# Patient Record
Sex: Female | Born: 1945 | Race: White | Hispanic: No | Smoking: Never smoker
Health system: Southern US, Community
[De-identification: ages and names within clinical notes are randomized; demographics above are authoritative.]

## PROBLEM LIST (undated history)

## (undated) DIAGNOSIS — K219 Gastro-esophageal reflux disease without esophagitis: Secondary | ICD-10-CM

## (undated) DIAGNOSIS — I10 Essential (primary) hypertension: Secondary | ICD-10-CM

## (undated) DIAGNOSIS — Z973 Presence of spectacles and contact lenses: Secondary | ICD-10-CM

## (undated) DIAGNOSIS — E7800 Pure hypercholesterolemia, unspecified: Secondary | ICD-10-CM

## (undated) DIAGNOSIS — E119 Type 2 diabetes mellitus without complications: Secondary | ICD-10-CM

## (undated) DIAGNOSIS — R519 Headache, unspecified: Secondary | ICD-10-CM

## (undated) DIAGNOSIS — I441 Atrioventricular block, second degree: Secondary | ICD-10-CM

## (undated) DIAGNOSIS — I4891 Unspecified atrial fibrillation: Secondary | ICD-10-CM

## (undated) DIAGNOSIS — N816 Rectocele: Secondary | ICD-10-CM

## (undated) DIAGNOSIS — N8110 Cystocele, unspecified: Secondary | ICD-10-CM

## (undated) DIAGNOSIS — Z950 Presence of cardiac pacemaker: Secondary | ICD-10-CM

## (undated) DIAGNOSIS — N814 Uterovaginal prolapse, unspecified: Secondary | ICD-10-CM

## (undated) DIAGNOSIS — K5900 Constipation, unspecified: Secondary | ICD-10-CM

## (undated) DIAGNOSIS — I2510 Atherosclerotic heart disease of native coronary artery without angina pectoris: Secondary | ICD-10-CM

## (undated) HISTORY — PX: CORONARY ANGIOPLASTY: SHX604

## (undated) HISTORY — PX: ROTATOR CUFF REPAIR: SHX139

## (undated) HISTORY — PX: BACK SURGERY: SHX140

## (undated) HISTORY — PX: KNEE ARTHROSCOPY: SHX127

## (undated) HISTORY — DX: Unspecified atrial fibrillation: I48.91

## (undated) HISTORY — PX: ABDOMINAL HYSTERECTOMY: SHX81

## (undated) HISTORY — PX: FOOT SURGERY: SHX648

## (undated) HISTORY — PX: APPENDECTOMY: SHX54

## (undated) HISTORY — DX: Gastro-esophageal reflux disease without esophagitis: K21.9

---

## 1998-12-15 ENCOUNTER — Other Ambulatory Visit: Admission: RE | Admit: 1998-12-15 | Discharge: 1998-12-15 | Payer: Self-pay | Admitting: Obstetrics & Gynecology

## 1999-11-12 ENCOUNTER — Ambulatory Visit (HOSPITAL_COMMUNITY): Admission: RE | Admit: 1999-11-12 | Discharge: 1999-11-12 | Payer: Self-pay | Admitting: Gastroenterology

## 1999-11-12 ENCOUNTER — Encounter (INDEPENDENT_AMBULATORY_CARE_PROVIDER_SITE_OTHER): Payer: Self-pay | Admitting: Specialist

## 1999-12-16 ENCOUNTER — Other Ambulatory Visit: Admission: RE | Admit: 1999-12-16 | Discharge: 1999-12-16 | Payer: Self-pay | Admitting: Obstetrics and Gynecology

## 2001-06-15 ENCOUNTER — Other Ambulatory Visit: Admission: RE | Admit: 2001-06-15 | Discharge: 2001-06-15 | Payer: Self-pay | Admitting: Obstetrics and Gynecology

## 2002-06-18 ENCOUNTER — Other Ambulatory Visit: Admission: RE | Admit: 2002-06-18 | Discharge: 2002-06-18 | Payer: Self-pay | Admitting: Obstetrics and Gynecology

## 2002-11-20 ENCOUNTER — Encounter: Admission: RE | Admit: 2002-11-20 | Discharge: 2002-11-20 | Payer: Self-pay | Admitting: Surgery

## 2002-11-20 ENCOUNTER — Encounter: Payer: Self-pay | Admitting: Surgery

## 2002-12-04 ENCOUNTER — Ambulatory Visit (HOSPITAL_BASED_OUTPATIENT_CLINIC_OR_DEPARTMENT_OTHER): Admission: RE | Admit: 2002-12-04 | Discharge: 2002-12-04 | Payer: Self-pay | Admitting: Surgery

## 2002-12-04 ENCOUNTER — Encounter (INDEPENDENT_AMBULATORY_CARE_PROVIDER_SITE_OTHER): Payer: Self-pay | Admitting: *Deleted

## 2004-06-03 ENCOUNTER — Ambulatory Visit (HOSPITAL_COMMUNITY): Admission: RE | Admit: 2004-06-03 | Discharge: 2004-06-04 | Payer: Self-pay | Admitting: Neurosurgery

## 2004-07-30 ENCOUNTER — Ambulatory Visit: Payer: Self-pay | Admitting: Family Medicine

## 2004-08-12 ENCOUNTER — Ambulatory Visit: Payer: Self-pay | Admitting: Family Medicine

## 2004-08-18 ENCOUNTER — Ambulatory Visit: Payer: Self-pay | Admitting: Family Medicine

## 2004-09-23 ENCOUNTER — Ambulatory Visit: Payer: Self-pay | Admitting: Family Medicine

## 2005-02-02 ENCOUNTER — Ambulatory Visit: Payer: Self-pay | Admitting: Family Medicine

## 2005-05-20 ENCOUNTER — Ambulatory Visit: Payer: Self-pay | Admitting: Family Medicine

## 2005-08-19 ENCOUNTER — Ambulatory Visit: Payer: Self-pay | Admitting: Family Medicine

## 2005-11-24 ENCOUNTER — Ambulatory Visit: Payer: Self-pay | Admitting: Family Medicine

## 2006-01-17 ENCOUNTER — Ambulatory Visit: Payer: Self-pay | Admitting: Family Medicine

## 2006-04-04 ENCOUNTER — Ambulatory Visit: Payer: Self-pay | Admitting: Family Medicine

## 2006-07-12 ENCOUNTER — Ambulatory Visit: Payer: Self-pay | Admitting: Family Medicine

## 2006-07-28 ENCOUNTER — Ambulatory Visit: Payer: Self-pay | Admitting: Family Medicine

## 2006-08-08 ENCOUNTER — Ambulatory Visit: Payer: Self-pay | Admitting: Cardiovascular Disease

## 2006-08-29 ENCOUNTER — Ambulatory Visit: Payer: Self-pay

## 2006-08-29 ENCOUNTER — Encounter: Payer: Self-pay | Admitting: Cardiology

## 2006-09-02 ENCOUNTER — Ambulatory Visit: Payer: Self-pay | Admitting: Cardiovascular Disease

## 2006-09-07 ENCOUNTER — Ambulatory Visit: Payer: Self-pay | Admitting: Cardiovascular Disease

## 2006-09-13 DIAGNOSIS — I25119 Atherosclerotic heart disease of native coronary artery with unspecified angina pectoris: Secondary | ICD-10-CM | POA: Insufficient documentation

## 2006-09-13 DIAGNOSIS — Z955 Presence of coronary angioplasty implant and graft: Secondary | ICD-10-CM

## 2006-09-13 HISTORY — DX: Presence of coronary angioplasty implant and graft: Z95.5

## 2006-09-13 HISTORY — DX: Atherosclerotic heart disease of native coronary artery with unspecified angina pectoris: I25.119

## 2006-09-14 ENCOUNTER — Ambulatory Visit (HOSPITAL_COMMUNITY): Admission: RE | Admit: 2006-09-14 | Discharge: 2006-09-14 | Payer: Self-pay | Admitting: Cardiovascular Disease

## 2006-09-15 ENCOUNTER — Inpatient Hospital Stay (HOSPITAL_BASED_OUTPATIENT_CLINIC_OR_DEPARTMENT_OTHER): Admission: RE | Admit: 2006-09-15 | Discharge: 2006-09-15 | Payer: Self-pay | Admitting: Cardiovascular Disease

## 2006-09-15 ENCOUNTER — Ambulatory Visit: Payer: Self-pay | Admitting: Cardiovascular Disease

## 2006-09-19 ENCOUNTER — Observation Stay (HOSPITAL_COMMUNITY): Admission: AD | Admit: 2006-09-19 | Discharge: 2006-09-20 | Payer: Self-pay | Admitting: Cardiovascular Disease

## 2006-09-19 ENCOUNTER — Ambulatory Visit: Payer: Self-pay | Admitting: Cardiovascular Disease

## 2006-09-27 ENCOUNTER — Ambulatory Visit: Payer: Self-pay | Admitting: Cardiovascular Disease

## 2006-10-28 ENCOUNTER — Ambulatory Visit: Payer: Self-pay | Admitting: Family Medicine

## 2006-11-14 ENCOUNTER — Ambulatory Visit: Payer: Self-pay | Admitting: Cardiovascular Disease

## 2006-11-14 LAB — CONVERTED CEMR LAB
Basophils Absolute: 0.1 10*3/uL (ref 0.0–0.1)
Basophils Relative: 0.7 % (ref 0.0–1.0)
Eosinophils Absolute: 0.2 10*3/uL (ref 0.0–0.6)
Eosinophils Relative: 2 % (ref 0.0–5.0)
HCT: 36.5 % (ref 36.0–46.0)
Hemoglobin: 12.6 g/dL (ref 12.0–15.0)
Lymphocytes Relative: 31 % (ref 12.0–46.0)
MCHC: 34.6 g/dL (ref 30.0–36.0)
MCV: 87.9 fL (ref 78.0–100.0)
Monocytes Absolute: 0.5 10*3/uL (ref 0.2–0.7)
Monocytes Relative: 6.3 % (ref 3.0–11.0)
Neutro Abs: 5 10*3/uL (ref 1.4–7.7)
Neutrophils Relative %: 60 % (ref 43.0–77.0)
Platelets: 309 10*3/uL (ref 150–400)
RBC: 4.15 M/uL (ref 3.87–5.11)
RDW: 11.8 % (ref 11.5–14.6)
WBC: 8.4 10*3/uL (ref 4.5–10.5)

## 2006-12-29 ENCOUNTER — Ambulatory Visit: Payer: Self-pay | Admitting: Cardiovascular Disease

## 2007-01-27 ENCOUNTER — Ambulatory Visit: Payer: Self-pay | Admitting: Family Medicine

## 2007-06-19 ENCOUNTER — Ambulatory Visit: Payer: Self-pay | Admitting: Cardiovascular Disease

## 2007-06-27 ENCOUNTER — Ambulatory Visit: Payer: Self-pay

## 2007-06-27 ENCOUNTER — Encounter: Payer: Self-pay | Admitting: Cardiovascular Disease

## 2007-10-15 ENCOUNTER — Emergency Department (HOSPITAL_COMMUNITY): Admission: EM | Admit: 2007-10-15 | Discharge: 2007-10-15 | Payer: Self-pay | Admitting: Emergency Medicine

## 2008-02-08 ENCOUNTER — Ambulatory Visit: Payer: Self-pay | Admitting: Cardiovascular Disease

## 2008-08-05 ENCOUNTER — Ambulatory Visit: Payer: Self-pay | Admitting: Cardiovascular Disease

## 2009-03-01 DIAGNOSIS — N390 Urinary tract infection, site not specified: Secondary | ICD-10-CM | POA: Insufficient documentation

## 2009-03-01 DIAGNOSIS — Z8719 Personal history of other diseases of the digestive system: Secondary | ICD-10-CM | POA: Insufficient documentation

## 2009-03-01 DIAGNOSIS — R609 Edema, unspecified: Secondary | ICD-10-CM | POA: Insufficient documentation

## 2009-03-01 DIAGNOSIS — E7800 Pure hypercholesterolemia, unspecified: Secondary | ICD-10-CM | POA: Insufficient documentation

## 2009-03-01 DIAGNOSIS — R0602 Shortness of breath: Secondary | ICD-10-CM | POA: Insufficient documentation

## 2009-03-01 DIAGNOSIS — M722 Plantar fascial fibromatosis: Secondary | ICD-10-CM | POA: Insufficient documentation

## 2009-03-01 DIAGNOSIS — K219 Gastro-esophageal reflux disease without esophagitis: Secondary | ICD-10-CM | POA: Insufficient documentation

## 2009-03-01 DIAGNOSIS — R011 Cardiac murmur, unspecified: Secondary | ICD-10-CM | POA: Insufficient documentation

## 2009-03-01 DIAGNOSIS — I10 Essential (primary) hypertension: Secondary | ICD-10-CM | POA: Insufficient documentation

## 2009-04-18 ENCOUNTER — Ambulatory Visit: Payer: Self-pay | Admitting: Cardiovascular Disease

## 2009-04-18 DIAGNOSIS — I2510 Atherosclerotic heart disease of native coronary artery without angina pectoris: Secondary | ICD-10-CM | POA: Insufficient documentation

## 2009-05-20 ENCOUNTER — Ambulatory Visit (HOSPITAL_COMMUNITY): Admission: RE | Admit: 2009-05-20 | Discharge: 2009-05-20 | Payer: Self-pay | Admitting: Orthopedic Surgery

## 2009-07-23 ENCOUNTER — Telehealth (INDEPENDENT_AMBULATORY_CARE_PROVIDER_SITE_OTHER): Payer: Self-pay | Admitting: *Deleted

## 2009-09-18 ENCOUNTER — Encounter (INDEPENDENT_AMBULATORY_CARE_PROVIDER_SITE_OTHER): Payer: Self-pay | Admitting: *Deleted

## 2009-10-20 ENCOUNTER — Ambulatory Visit: Payer: Self-pay | Admitting: Cardiovascular Disease

## 2010-03-02 ENCOUNTER — Encounter (INDEPENDENT_AMBULATORY_CARE_PROVIDER_SITE_OTHER): Payer: Self-pay | Admitting: *Deleted

## 2010-06-23 ENCOUNTER — Ambulatory Visit: Payer: Self-pay | Admitting: Cardiovascular Disease

## 2010-10-13 NOTE — Assessment & Plan Note (Signed)
Summary: F6M/DM   CC:  6 month check up.  History of Present Illness: [name] [name] is seen today for F/U of CAD.  She had a stent to the LAD in January of 08.  F/U myovue in 10/08 was normal.  she has not had any significant chest pain.  Her plantar fasciitis is improved. She did end up seeing Dr Charlann Boxer for orthopedic issues and had arthroscopic right knee surgery with good success.  He does blood work every 3 months.  Her hemoglobin A1c was 6.8.  Her LDL cholesterol is under 100.  His not having any side effects from her medicine.  She checks her blood sugar 2-3 times per week.  No palpitations syncope diaphoresis exertional dyspnea or edema.  She's been compliant with her meds.  Current Problems (verified): 1)  Cad  (ICD-414.00) 2)  Murmur  (ICD-785.2) 3)  Hypertension  (ICD-401.9) 4)  Gerd  (ICD-530.81) 5)  Plantar Fasciitis  (ICD-728.71) 6)  Hypercholesterolemia  (ICD-272.0) 7)  Shortness of Breath  (ICD-786.05) 8)  Uti  (ICD-599.0) 9)  Irritable Bowel Syndrome, Hx of  (ICD-V12.79) 10)  Edema  (ICD-782.3)  Current Medications (verified): 1)  Atenolol 25 Mg Tabs (Atenolol) .... Take One Tablet By Mouth Daily 2)  Vytorin 10-80 Mg Tabs (Ezetimibe-Simvastatin) .... Take One Tablet By Mouth Dailyat Bedtimer 3)  Fish Oil   Oil (Fish Oil) .Marland Kitchen.. 1 Tab By Mouth Once Daily 4)  Fexofenadine Hcl 180 Mg Tabs (Fexofenadine Hcl) .... 1/2 Tab By Mouth Once Daily 5)  Lisinopril-Hydrochlorothiazide 10-12.5 Mg Tabs (Lisinopril-Hydrochlorothiazide) .Marland Kitchen.. 1 Tab By Mouth Once Daily 6)  Metformin Hcl 500 Mg Tabs (Metformin Hcl) .Marland Kitchen.. 1 Tab By Mouth Two Times A Day 7)  Omeprazole 20 Mg Cpdr (Omeprazole) .Marland Kitchen.. 1 Tab By Mouth Once Daily 8)  Vitamin C 500 Mg Tabs (Ascorbic Acid) .Marland Kitchen.. 1 Tab By Mouth Once Daily 9)  Aspirin 81 Mg Tbec (Aspirin) .... Take One Tablet By Mouth Daily 10)  Meloxicam 7.5 Mg Tabs (Meloxicam) .Marland Kitchen.. 1 Tab By Mouth Once Daily  Allergies (verified): No Known Drug Allergies  Past History:  Past  Medical History: Last updated: 03/01/2009 Current Problems:  CAD:  complex intervention Cooper LAD stent 2008 MURMUR (ICD-785.2) HYPERTENSION (ICD-401.9) GERD (ICD-530.81) PLANTAR FASCIITIS (ICD-728.71) HYPERCHOLESTEROLEMIA (ICD-272.0) SHORTNESS OF BREATH (ICD-786.05) UTI (ICD-599.0) IRRITABLE BOWEL SYNDROME, HX OF (ICD-V12.79) EDEMA (ICD-782.3)  Past Surgical History: Last updated: 03/01/2009 PTCA and drug-eluting stent placement in the mid-LAD with  Angio-Seal of the left femoral artery. Successful PTCA and drug-eluting stent implantation in the  mid-LAD. RECOMMENDATIONS:  Recommend lifelong aspirin with a minimum of 12 months  of clopidogrel.  I elected not to attempt intervention on the small  obtuse marginal branch as the LAD intervention was quite complex (see  details above).  I would expect [name] [name] to have symptomatic relief from the  LAD from treating the LAD.  If she has residual symptoms we could  consider trying to open the obtuse marginal branch in 4-6 weeks, but  this is a very small vessel and I am not sure that we would be  successful in opening it. [name] [name]. Excell Seltzer, MD  MDC/MEDQ  D:  09/19/2006  T:  09/19/2006  Job:  [id number]    Family History: Last updated: 03/01/2009 Mother Heart attack Siblings: Cancer  Social History: Last updated: 03/01/2009  non-drinker, no drug abuse,currentsmoker w/i last12 mos.   Review of Systems       Denies fever, malais, weight loss, blurry vision, decreased visual  acuity, cough, sputum, SOB, hemoptysis, pleuritic pain, palpitaitons, heartburn, abdominal pain, melena, lower extremity edema, claudication, or rash. All other systems reviewed and negative  Vital Signs:  Patient profile:   65 year old female Height:      65 inches Weight:      221 pounds BMI:     36.91 Pulse rate:   67 / minute Resp:     12 per minute BP sitting:   141 / 71  (left arm)  Vitals Entered By: Kem Parkinson (October 20, 2009 12:05  PM)  Physical Exam  General:  Affect appropriate Healthy:  appears stated age HEENT: normal Neck supple with no adenopathy JVP normal no bruits no thyromegaly Lungs clear with no wheezing and good diaphragmatic motion Heart:  S1/S2 no murmur,rub, gallop or click PMI normal Abdomen: benighn, BS positve, no tenderness, no AAA no bruit.  No HSM or HJR Distal pulses intact with no bruits No edema Neuro non-focal Skin warm and dry    Impression & Recommendations:  Problem # 1:  CAD (ICD-414.00) Stable no anina.  stent LAD in 2008 not on Plavix anymore.   Consider myovue in 6 months Her updated medication list for this problem includes:    Atenolol 25 Mg Tabs (Atenolol) .Marland Kitchen... Take one tablet by mouth daily    Lisinopril-hydrochlorothiazide 10-12.5 Mg Tabs (Lisinopril-hydrochlorothiazide) .Marland Kitchen... 1 tab by mouth once daily    Aspirin 81 Mg Tbec (Aspirin) .Marland Kitchen... Take one tablet by mouth daily  Problem # 2:  HYPERTENSION (ICD-401.9) Well cotnrolled Her updated medication list for this problem includes:    Atenolol 25 Mg Tabs (Atenolol) .Marland Kitchen... Take one tablet by mouth daily    Lisinopril-hydrochlorothiazide 10-12.5 Mg Tabs (Lisinopril-hydrochlorothiazide) .Marland Kitchen... 1 tab by mouth once daily    Aspirin 81 Mg Tbec (Aspirin) .Marland Kitchen... Take one tablet by mouth daily  Problem # 3:  HYPERCHOLESTEROLEMIA (ICD-272.0) Lab work per primary.  Target LDL 70 or less. Her updated medication list for this problem includes:    Vytorin 10-80 Mg Tabs (Ezetimibe-simvastatin) .Marland Kitchen... Take one tablet by mouth dailyat bedtimer

## 2010-10-13 NOTE — Letter (Signed)
Summary: Appointment - Reminder 2  [facility], Main Office  [address]   [place], [place] [address]   Phone: [phone]  Fax: [phone]     March 02, 2010 MRN: [id number]   BRANTLEIGH MIFFLIN [address] [place], [place]  [address]   Dear Ms. DARTY,  Our records indicate that it is time to schedule a follow-up appointment with Dr. Eden Emms in August. It is very important that we reach you to schedule this appointment. We look forward to participating in your health care needs. Please contact us at the number listed above at your earliest convenience to schedule your appointment.  If you are unable to make an appointment at this time, give [place] a call so we can update our records.     Sincerely,   Migdalia Dk [facility] Scheduling Team

## 2010-10-13 NOTE — Letter (Signed)
Summary: Appointment - Reminder 2  [facility], Main Office  [address]   [place], [place] [address]   Phone: [phone]  Fax: [phone]     September 18, 2009 MRN: [id number]   ANDERIA LORENZO [address] [place], [place]  [address]   Dear Ms. THAXTON,  Our records indicate that it is time to schedule a follow-up appointment with Dr. Eden Emms. It is very important that we reach you to schedule this appointment. We look forward to participating in your health care needs. Please contact us at the number listed above at your earliest convenience to schedule your appointment.  If you are unable to make an appointment at this time, give [place] a call so we can update our records.  Sincerely,   Migdalia Dk [facility] Scheduling Team

## 2010-10-13 NOTE — Assessment & Plan Note (Signed)
Summary: f9m   History of Present Illness: [name] [name] is seen today for F/U of CAD.  She had a stent to the LAD in January of 08.  F/U myovue in 10/08 was normal.  she has not had any significant chest pain.  Her plantar fasciitis is improved but she needed surgery on the left foot with Dr Pricilla Holm    She has been off both ASA and statin for a while She was on vytorin and should probably start Pravachol 40mg .  I gave her a note to give Dr Lysbeth Galas tomorow.  She had a nose bleed on lipitor and seemed to think it was related.  She will aslo resume 81mg  of ASA.  She denies SSCP, palpitations, edema, or syncope   Current Problems (verified): 1)  Cad  (ICD-414.00) 2)  Murmur  (ICD-785.2) 3)  Hypertension  (ICD-401.9) 4)  Gerd  (ICD-530.81) 5)  Plantar Fasciitis  (ICD-728.71) 6)  Hypercholesterolemia  (ICD-272.0) 7)  Shortness of Breath  (ICD-786.05) 8)  Uti  (ICD-599.0) 9)  Irritable Bowel Syndrome, Hx of  (ICD-V12.79) 10)  Edema  (ICD-782.3)  Current Medications (verified): 1)  Atenolol 25 Mg Tabs (Atenolol) .... Take One Tablet By Mouth Daily 2)  Fish Oil   Oil (Fish Oil) .Marland Kitchen.. 1 Tab By Mouth Once Daily 3)  Fexofenadine Hcl 180 Mg Tabs (Fexofenadine Hcl) .... 1/2 Tab Every Other Day 4)  Lisinopril-Hydrochlorothiazide 10-12.5 Mg Tabs (Lisinopril-Hydrochlorothiazide) .Marland Kitchen.. 1 Tab By Mouth Once Daily 5)  Metformin Hcl 500 Mg Tabs (Metformin Hcl) .Marland Kitchen.. 1 Tab By Mouth Once Daily 6)  Omeprazole 20 Mg Cpdr (Omeprazole) .Marland Kitchen.. 1 Tab By Mouth Once Daily 7)  Aspirin 81 Mg Tbec (Aspirin) .... Take One Tablet By Mouth Daily 8)  Multivitamins   Tabs (Multiple Vitamin) .Marland Kitchen.. 1  Tab By Mouth Once Daily  Allergies (verified): No Known Drug Allergies  Past History:  Past Medical History: Last updated: 03/01/2009 Current Problems:  CAD:  complex intervention Cooper LAD stent 2008 MURMUR (ICD-785.2) HYPERTENSION (ICD-401.9) GERD (ICD-530.81) PLANTAR FASCIITIS (ICD-728.71) HYPERCHOLESTEROLEMIA  (ICD-272.0) SHORTNESS OF BREATH (ICD-786.05) UTI (ICD-599.0) IRRITABLE BOWEL SYNDROME, HX OF (ICD-V12.79) EDEMA (ICD-782.3)  Past Surgical History: Last updated: 03/01/2009 PTCA and drug-eluting stent placement in the mid-LAD with  Angio-Seal of the left femoral artery. Successful PTCA and drug-eluting stent implantation in the  mid-LAD. RECOMMENDATIONS:  Recommend lifelong aspirin with a minimum of 12 months  of clopidogrel.  I elected not to attempt intervention on the small  obtuse marginal branch as the LAD intervention was quite complex (see  details above).  I would expect [name] [name] to have symptomatic relief from the  LAD from treating the LAD.  If she has residual symptoms we could  consider trying to open the obtuse marginal branch in 4-6 weeks, but  this is a very small vessel and I am not sure that we would be  successful in opening it. [name]. Excell Seltzer, MD  MDC/MEDQ  D:  09/19/2006  T:  09/19/2006  Job:  [id number]    Family History: Last updated: 03/01/2009 Mother Heart attack Siblings: Cancer  Social History: Last updated: 03/01/2009  non-drinker, no drug abuse,currentsmoker w/i last12 mos.   Review of Systems       Denies fever, malais, weight loss, blurry vision, decreased visual acuity, cough, sputum, SOB, hemoptysis, pleuritic pain, palpitaitons, heartburn, abdominal pain, melena, lower extremity edema, claudication, or rash.   Vital Signs:  Patient profile:   65 year old female Height:  65 inches Weight:      195 pounds BMI:     32.57 Pulse rate:   67 / minute Resp:     12 per minute BP sitting:   138 / 80  (left arm)  Vitals Entered By: Kem Parkinson (June 23, 2010 4:03 PM)  Physical Exam  General:  Affect appropriate Healthy:  appears stated age HEENT: normal Neck supple with no adenopathy JVP normal no bruits no thyromegaly Lungs clear with no wheezing and good diaphragmatic motion Heart:  S1/S2 no murmur,rub, gallop or click PMI  normal Abdomen: benighn, BS positve, no tenderness, no AAA no bruit.  No HSM or HJR Distal pulses intact with no bruits No edema Neuro non-focal Skin warm and dry    Impression & Recommendations:  Problem # 1:  CAD (ICD-414.00) Stable no angina Her updated medication list for this problem includes:    Atenolol 25 Mg Tabs (Atenolol) .Marland Kitchen... Take one tablet by mouth daily    Lisinopril-hydrochlorothiazide 10-12.5 Mg Tabs (Lisinopril-hydrochlorothiazide) .Marland Kitchen... 1 tab by mouth once daily    Aspirin 81 Mg Tbec (Aspirin) .Marland Kitchen... Take one tablet by mouth daily  Problem # 2:  HYPERTENSION (ICD-401.9) well controlled Her updated medication list for this problem includes:    Atenolol 25 Mg Tabs (Atenolol) .Marland Kitchen... Take one tablet by mouth daily    Lisinopril-hydrochlorothiazide 10-12.5 Mg Tabs (Lisinopril-hydrochlorothiazide) .Marland Kitchen... 1 tab by mouth once daily    Aspirin 81 Mg Tbec (Aspirin) .Marland Kitchen... Take one tablet by mouth daily  Problem # 3:  HYPERCHOLESTEROLEMIA (ICD-272.0) Resume statin likley pravachol per Dr Lysbeth Galas The following medications were removed from the medication list:    Vytorin 10-80 Mg Tabs (Ezetimibe-simvastatin) .Marland Kitchen... Take one tablet by mouth dailyat bedtimer  Patient Instructions: 1)  Your physician recommends that you schedule a follow-up appointment in: 6  MONTHS 2)  PRAVASTATIN   EKG Report  Procedure date:  06/23/2010  Findings:      NSR 68 Normal ECG

## 2011-01-26 NOTE — Assessment & Plan Note (Signed)
[facility]                            CARDIOLOGY OFFICE NOTE   NAME:[name] [name], [name] [name]                       MRN:          [id number]  DATE:02/08/2008                            DOB:          04/24/1946    Dela returns today for followup.  She has a history of a complex  intervention of the LAD.  This was done in October.  She had a followup  Myoview which is nonischemic.  She is not having chest pain.  Risk  factors are reasonably well modified.  Her last cholesterol LDL was  under 100.  She has had normal LFTs.   Her hemoglobin A1c was elevated at 6.9.  We talked about her diet some.  She is currently taking Glucophage twice a day.  She had previously been  on Actos, but this was discontinued.  I believe that she did have some  hypoglycemic reactions.   The patient has not been having chest pain.  She has been ambulatory;  however, her ambulation has been limited by foot pain.  We had a long  discussion about this.  She is seeing a foot doctor in [place].  She  appears to have plantar fasciitis.  She was put on Metanx which as far  as I can tell is L-methyl folate, essentially a B-vitamin compound.   The patient does not have claudication.  She has excellent pulses in her  leg, and I told her that I was not quite sure if the medication would be  effective for her outside of possible neuropathy.   She is to follow up with the foot doctor for question of orthotics.   REVIEW OF SYSTEMS:  Otherwise remarkable for an occasional headache  otherwise negative.   CURRENT MEDICATIONS INCLUDE:  1. Hydrochlorothiazide 25 a day.  2. Atenolol 25 a day.  3. Vitamin C.  4. Fish oil.  5. Metformin 500 b.i.d.  6. An aspirin a day.  7. Omeprazole 20 a day.  8. Vytorin 10/40.  9. Metanx not sure of the dose either 90 or 500 mg.   PHYSICAL EXAMINATION:  Remarkable for an over weight white female in no  distress.  Weight is 212, blood pressure 140/80,  pulse 81 and regular,  afebrile.  Affect appropriate.  HEENT:  Unremarkable.  Carotids are normal without bruit, no lymphadenopathy, no thyromegaly,  or JVP elevation.  LUNGS:  Clear to diaphragmatic motion.  No wheezing.  S1-S2 with systolic ejection murmur.  PMI normal.  ABDOMEN:  Benign.  Bowel sounds positive, no AAA no bruit, no  hepatosplenomegaly or hepatojugular reflux.  Femorals are +3 bilaterally without bruits.  PTs are +3 bilaterally.  She has excellent capillary refill in the legs.   IMPRESSION:  1. Coronary disease, complex intervention to the LAD.  Continue      aspirin and Plavix.  Followup Myoview in the year.  2. Hypertension currently well controlled.  Continue current dose of      beta blocker and now hydrochlorothiazide.  3. Diabetes needs better diet control.  Continue metformin.  Consider  adding back Avandia.  Followup hemoglobin A1c with primary care      doctor.  4. Plantar fasciitis followup with foot doctor.  I will leave it up to      her whether she wants to continue the Metanx or not, it is fairly      expensive; and I told her that if she does not have a circulatory      problem in her legs, she may benefit from orthotics.  5. Hypercholesterolemia in the setting of coronary disease.  Continue      with Vytorin 10/40.  Lipid and liver profile in 6 months.  6. History of reflux.  We will switch omeprazole to Protonix given      interaction with Plavix.   I will see the patient back in 6 months.     [name] Eden Emms, MD, [facility]  Electronically Signed    PCN/MedQ  DD: 02/08/2008  DT: 02/08/2008  Job #: [id number]

## 2011-01-26 NOTE — Assessment & Plan Note (Signed)
[facility]                            CARDIOLOGY OFFICE NOTE   NAME:[name] [name], [name] [name]                       MRN:          [id number]  DATE:06/19/2007                            DOB:          [birth year]    [name] [name] returns today for followup.   She has coronary disease with a complex intervention to the LAD by Dr.  Excell Seltzer in January.  She had residual OM disease.  He felt that this  could be intervened on but it was a small vessel in a diabetic and would  stage it pending recurrent symptoms.   The patient has had some stomach upset with her aspirin and Plavix.  She  is tolerating them.  We encouraged her to take the Plavix with meals.  In general she gets irritable bowel type syndrome which is helped with  Metamucil.  There has been no nausea or diarrhea.  She has not had any  recurrent chest pain.  She is active and walking.  She has mild  exertional dyspnea which is functional and probably secondary to her  weight.  There has been no palpitations or syncope.   When she was in the hospital, we had stopped her Actos due to the risk  of heart failure given her known coronary disease and placed her on  Glucophage.  She is to follow up with Dr. Lysbeth Galas in regards to her  sugar.   REVIEW OF SYSTEMS:  Otherwise negative.   CURRENT MEDICATIONS:  1. Hydrochlorothiazide 25 a day.  2. Atenolol 25 a day.  3. Fexofenadine 60 a day.  4. B12 shots.  5. Vitamin C.  6. Fish oil 1 gram a day.  7. Metformin 500 b.i.d.  8. Plavix 75 a day.  9. Aspirin daily.  10.Vytorin 10/80.  11.Omeprazole 20 a day.   PHYSICAL EXAMINATION:  GENERAL:  Remarkable for an overweight white  female in no distress.  Affect is appropriate.  VITAL SIGNS:  Weight is 216 which is down 2 pounds from April, blood  pressure 124/74, pulse 80 and regular.  She is afebrile.  Respiratory  rate is 14.  HEENT:  Normal.  NECK:  Carotids normal without bruit.  There is no lymphadenopathy,  no  thyromegaly, and no JVP elevation.  LUNGS:  Clear with good diaphragmatic motion.  No wheezing.  HEART:  There is an S1 with a systolic ejection murmur.  S2 is  preserved.  There is no diastolic murmur.  PMI is normal.  ABDOMEN:  Protuberant.  Bowel sounds are positive.  No AAA.  No  tenderness.  No hepatosplenomegaly.  No hepatojugular reflux.  No  bruits.  VASCULAR:  Femorals are plus 3 bilaterally and deep.  There are no  bruits.  PTs are plus 3.  EXTREMITIES:  There is trace lower extremity edema.  NEUROLOGIC:  Nonfocal.  There is no muscular weakness.  SKIN:  Warm and dry.   Her baseline EKG is normal with a right bundle branch block.   IMPRESSION:  1. Coronary disease, complex intervention to the left anterior      descending  artery.  Follow up Myoview in the next 2-3 weeks given      the complex nature of her intervention, also to rule out residual      significant ischemia in the obtuse marginal territory.  Continue      beta-blocker and aspirin and Plavix.  2. A systolic ejection murmur.  It is fairly loud today on exam.  I do      not recall hearing it.  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  We will do a followup on this      to reassess her left ventricular function and check her murmur by a      2D echocardiography.  3. Hypercholesterolemia in the setting of diabetes and coronary      disease.  Continue fish oil and Vytorin 10/80.  Follow up lipid and      liver profiles in 6 months.  Continue a low caloric diet and      diabetic diet.  4. Lower extremity edema with shortness of breath, improved.      Shortness of breath appears functional.  She has good left      ventricular function.  We will check her mitral in flow patterns by      echocardiogram.  She will continue low dose diuretics.  She will      elevate her legs at the end of the day.  There is no  evidence of      gross venous insufficiency or varicosities.  5. Diabetes.  Actos stopped, now on Metformin.  Follow up with Dr.      Lysbeth Galas.  Check hemoglobin A1c quarterly.  The patient was informed      of how important it is to control her sugar in regards to      restenosis.  6. Reflux and irritable bowel.  Continue Metamucil as needed.  Avoid      spicy foods.  Continue H2 blocker as needed.   I will see the patient back in 6 months so long as her echo and Myoview  are low risk.     [name] Eden Emms, MD, [facility]  Electronically Signed    PCN/MedQ  DD: 06/19/2007  DT: 06/19/2007  Job #: [id number]

## 2011-01-26 NOTE — Assessment & Plan Note (Signed)
[facility]                            CARDIOLOGY OFFICE NOTE   NAME:Sirek, AARA                       MRN:          [id number]  DATE:08/05/2008                            DOB:          1946-07-18    [name] [name] returns today for followup.  She had a complex intervention of the  mid-LAD on September 19, 2006, by Dr. Excell Seltzer.  She has been doing well.  She has not had any recurrent chest pain.  Her coronary risk factors  include hypertension, hypercholesterolemia, and diabetes.  Her diabetes  has been suboptimally controlled.  Her fasting sugars tend to be in the  140 range.  I believe her last hemoglobin A1c was 7.  Last time, I saw  her I thought she needed to be on something besides metformin.  I told  her I did not want her on Actos, but I think Byetta may be a reasonable  choice for her.  She will follow up with Dr. Lysbeth Galas for this.   Her review of systems is otherwise negative except for continued plantar  fasciitis in the right foot.  She sees a foot doctor for this and has  had injections and prednisone, but still has problems.  This has limited  her mobility.  She is allergic to DEMEROL.  She is on  hydrochlorothiazide 25 a day, atenolol 25 a day, fexofenadine 60 a day,  vitamin C, fish oil, metformin 500 b.i.d., aspirin, omeprazole 20 day,  Vytorin 10/40, and Metanx for neuropathy.   PHYSICAL EXAMINATION:  GENERAL:  An overweight white female in no  distress.  VITAL SIGNS:  Her weight is 214, blood pressure 146/82, pulse 69 and  regular, respiratory rate 14, and afebrile.  HEENT:  Unremarkable.  NECK:  Carotids are normal without bruit.  No lymphadenopathy,  thyromegaly, or JVP elevation.  LUNGS:  Clear with good diaphragmatic motion.  No wheezing.  CARDIAC:  S1 and S2 with a systolic ejection murmur.  PMI normal.  ABDOMEN:  Benign.  Bowel sounds positive.  No AAA.  No tenderness.  No  bruit.  No hepatosplenomegaly or hepatojugular reflux.  EXTREMITIES:  Distal pulses are intact.  No edema.  NEUROLOGIC:  Nonfocal.  SKIN:  Warm and dry.  MUSCULOSKELETAL:  No muscular weakness.   IMPRESSION:  1. Coronary artery disease, previous drug-eluting stent to the left      anterior descending.  Probable followup Myoview in 6 months.      Continue aspirin and beta-blocker.  2. Hypercholesterolemia, stable.  Continue statin.  Follow up with Dr.      Lysbeth Galas for LFTs.  She states they have been normal in the last 6      months.  3. Diabetes.  Follow up with Dr. Lysbeth Galas and consider addition of      Byetta.  Hemoglobin A1c target less than 6.5.  I will have to go      back and look through my records to see why she is not on an ACE      inhibitor given her diabetes.  4. Hypertension, currently well controlled.  Continue current dose of      hydrochlorothiazide and      atenolol.  5. Plantar fasciitis.  Follow up with a foot doctor.  Continue to wear      inserts and p.r.n. steroids.     [name] Eden Emms, MD, [facility]  Electronically Signed    PCN/MedQ  DD: 08/05/2008  DT: 08/06/2008  Job #: [id number]

## 2011-01-29 NOTE — Cardiovascular Report (Signed)
NAME:  [name] [name], [name] [name]              ACCOUNT NO.:  [id number]   MEDICAL RECORD NO.:  [id number]          PATIENT TYPE:  OIB   LOCATION:  6523                         FACILITY:  [facility]   PHYSICIAN:  [name]. Excell Seltzer, MD  DATE OF BIRTH:  Dec 13, 1945   DATE OF PROCEDURE:  09/19/2006  DATE OF DISCHARGE:                            CARDIAC CATHETERIZATION   PROCEDURE:  PTCA and drug-eluting stent placement in the mid-LAD with  Angio-Seal of the left femoral artery.   PROCEDURAL INDICATIONS:  [name] [name] is a very nice 65 year old woman  with diabetes who presented for initial evaluation with classic symptoms  of exertional angina.  She was referred for diagnostic cardiac  catheterization last week that demonstrated high-grade mid LAD disease.  The area appeared calcified and had tight focal stenosis.  There was  also a subtotally occluded small obtuse marginal branch.  After  reviewing her case with Dr. Eden Emms I elected to perform PCI on the LAD  with consideration of trying to open her obtuse marginal depending on  how the LAD procedure went.   Procedural details, risks and indications of the procedure were reviewed  in detail with the patient and her husband.  Informed consent was  obtained.  The left groin was prepped and draped under normal sterile  technique.  A 6-French sheath was placed in the left femoral artery  without difficulty.  A 6-French CLS 3.5 mm coronary guide catheter was  inserted.  Angiomax was started for anticoagulation.  The patient had  been premedicated with clopidogrel.  Once a therapeutic ACT was  achieved, a Cougar coronary guidewire was passed into the distal LAD.  I  initially attempted to perform intravascular ultrasound on the lesion,  but was unable to pass the IVIS probe beyond the area of stenosis likely  due to heavy calcification.  I then placed a 2.5 x 8-mm Quantum Maverick  balloon and inflated it up to 20 atmospheres.  The balloon did not  appear  to expand well.  Therefore, I used a 2.5 x 6 mm cutting balloon,  and inflated the balloon on the proximal edge of the lesion up to 10  atmospheres on two subsequent inflations.  However, I was unable to pass  the cutting balloon into the area of most severe stenosis.   At that point I placed a 2.5 x 12-mm maverick balloon across the lesion  and inflated it to 18 atmospheres.  There was a severe waste in the  middle of the balloon and it was clearly not well expanded.  I then  passed a 2.0 x 15-mm Quantum Maverick noncompliant balloon across the  lesion and inflated it up to 20 atmospheres on two inflations.  This  balloon appeared to inflate well and the lesion appeared to have finally  opened.  I then placed a 2.5 x 15 mm Quantum Maverick balloon across the  lesion and inflated it to 20 atmospheres with good balloon expansion.  At that point I felt confident that I would able to deliver a stent and  that it would expand so I chose a 2.75 x  16 mm Taxus Express stent and  deployed it at 16 atmospheres.  It was post dilated with a 3-0 x 12  Quantum Maverick balloon up to 20 atmospheres on two subsequent  inflations.  I then performed post PCI IVIS that demonstrated excellent  stent expansion with a good proximal and distal transition into the  vessel.  Angiomax was discontinued and the left femoral artery was  closed with an Angio-Seal device.   CONCLUSION:  Successful PTCA and drug-eluting stent implantation in the  mid-LAD.   RECOMMENDATIONS:  Recommend lifelong aspirin with a minimum of 12 months  of clopidogrel.  I elected not to attempt intervention on the small  obtuse marginal branch as the LAD intervention was quite complex (see  details  above).  I would expect [name] [name] to have symptomatic relief from the  LAD from treating the LAD.  If she has residual symptoms we could  consider trying to open the obtuse marginal branch in 4-6 weeks, but  this is a very small vessel and  I am not sure that we would be  successful in opening it.      [name]. Excell Seltzer, MD  Electronically Signed     MDC/MEDQ  D:  09/19/2006  T:  09/19/2006  Job:  [id number]   cc:   [name] Eden Emms, MD, [facility]  Delaney Meigs, M.D.

## 2011-01-29 NOTE — Op Note (Signed)
   [name], [name] [name]                          ACCOUNT NO.:  [id number]   MEDICAL RECORD NO.:  [id number]                   PATIENT TYPE:  AMB   LOCATION:  DSC                                  FACILITY:  [facility]   PHYSICIAN:  Thornton Park. Daphine Deutscher, M.D.             DATE OF BIRTH:  1946-06-20   DATE OF PROCEDURE:  12/04/2002  DATE OF DISCHARGE:                                 OPERATIVE REPORT   CCS [phone]   POSTOPERATIVE DIAGNOSIS:  Probable lipoma arising from stretch mark down in  groin.   OPERATION PERFORMED:  Exploration of groin, biopsy and excision of mass.   SURGEON:  Thornton Park. Daphine Deutscher, M.D.   ANESTHESIA:  General by LMA.   INDICATIONS FOR PROCEDURE:  The patient is a 65 year old lady with a growing  mass in her left groin.   DESCRIPTION OF PROCEDURE:  First we palpated this area together and marked  it.  She was then given general LMA.  The area was prepped with Betadine and  draped sterilely.  A transverse incision was made overlying it and I  dissected it free from the surrounding tissue using electrocautery to  control bleeding.  There was about a 4 cm oblong mass that was stuck right  onto the skin in the proximal thigh a couple of centimeters down in an old  thick stretch mark.  This was detached from the skin without button holing  it and the mass was removed in toto.  Bleeding was controlled.  The wound  was then closed with 4-0 subcutaneously and subcuticularly with benzoin and  Steri-Strips on the skin.  The patient will be given Vicodin to take for  pain and he will be followed up in the office in about three weeks.                                                Thornton Park Daphine Deutscher, M.D.    MBM/MEDQ  D:  12/04/2002  T:  12/04/2002  Job:  [id number]   cc:   Colon Flattery, MD  [address]  [place]  [place] [address]  Fax: [phone]

## 2011-01-29 NOTE — Discharge Summary (Signed)
NAMEMarland [name]  HILLIARY, JOCK              ACCOUNT NO.:  [id number]   MEDICAL RECORD NO.:  [id number]          PATIENT TYPE:  INP   LOCATION:  6523                         FACILITY:  [facility]   PHYSICIAN:  [name]. Excell Seltzer, MD  DATE OF BIRTH:  February 14, 1946   DATE OF ADMISSION:  09/19/2006  DATE OF DISCHARGE:  09/20/2006                         DISCHARGE SUMMARY - REFERRING   DISCHARGE DIAGNOSES:  1. Unstable angina with positive stress Myoview.  2. Coronary artery disease.  3. Status post angioplasty and drug-eluting stent to the mid left      anterior descending.  4. Hypoglycemia with a history of diabetes.  5. History of hyperlipidemia.  6. Hypertension.  7. Obesity.   SUMMARY OF HISTORY:  Ms. Moncure is a 65 year old white female that Dr.  Eden Emms initially evaluated on August 08, 2006 for exertional chest  discomfort associated with shortness of breath.  An echocardiogram  showed normal LV function with mild left atrial enlargement.  Exercise  stress test on July 30, 2006 was abnormal in regards to imaging and  showed possible inferolateral wall ischemia.  She was admitted for  cardiac catheterization.   Her history is notable for type 2 diabetes, hyperlipidemia, hysterectomy  and hypertension.   LABORATORY DATA:  EKGs show normal sinus rhythm, normal axis,  nonspecific ST-T wave changes.   Admission H&H were 14.4 and 41.9, normal indices, platelets of 233,000,  WBC 7.0; at the time of discharge, this remained unchanged.  PTT 27, PT  12.6.  Sodium 137, potassium 4.4, BUN 19, creatinine 0.7; prior to  discharge, potassium was 3.6, BUN 13, creatinine 0.8.  CK-MB post  procedure was 41 and 1.3.   Chest x-ray on the 2nd did not show any active disease.   HOSPITAL COURSE:  Ms. Koehn underwent cardiac catheterization on  September 15, 2006 by Dr. Eden Emms; this showed an ejection fraction of 60%  with basilar inferior hypokinesis.  She had nonobstructive lesions in  the RCA, 20%  left main, 30% circumflex, subtotaled OM-1.  She had a  significant LAD lesion.  Findings were reviewed with Dr. Excell Seltzer and Dr.  Excell Seltzer performed drug-eluting stent to the mid LAD, reducing a 90%  lesion to 0%, restoring TIMI-3 flow.  He recommended aspirin for life  and clopidogrel for at least 1 year.  Post catheterization, site was  unremarkable.  By September 20, 2006, she was ambulating in the halls  without difficulty.  Cardiac Rehab assisted with ambulation and  education.  Dr. Excell Seltzer felt that she could be discharged home; however,  he changed her Actos to metformin.   PROCEDURES PERFORMED:  Percutaneous transluminal coronary angioplasty  and Taxus stenting to the mid left anterior descending by Dr. Excell Seltzer.   DISPOSITION:  The patient is discharged home.   DIET:  She is asked to maintain a low-salt/-fat/-cholesterol ADA diet.   ACTIVITIES AND WOUND CARE:  Per supplemental sheet.   SPECIAL DISCHARGE INSTRUCTIONS:  She was asked to bring all medications  to appointments.   DISCHARGE MEDICATIONS:   NEW MEDICATIONS:  1. Plavix 75 mg daily.  2. Aspirin 325 mg daily.  3.  Nitroglycerin 0.4 mg as needed.  4. She was also given a prescription for Metformin 500 mg b.i.d. and      asked not to take her Actos.   She was asked to continue:  1. Atenolol 25 mg daily.  2. Vytorin 10/40 mg nightly.  3. Hydrochlorothiazide 25 mg daily.  4. Fexofenadine 60 mg daily.  5. Vitamin B12.  6. Vitamin C.  7. Fish oil as previously.   FOLLOWUP:  She will follow up with Dr. Eden Emms in the office on September 27, 2006 at 12:30.  She is also asked to follow up with Dr. Lysbeth Galas in  regards to the recent change of her diabetes medications.   DISCHARGE TIME:  Twenty minutes.      Joellyn Rued, PA-C      [name]. Excell Seltzer, MD  Electronically Signed    EW/MEDQ  D:  09/20/2006  T:  09/20/2006  Job:  [id number]   cc:   Delaney Meigs, M.D.  [name] Eden Emms, MD, [facility]

## 2011-01-29 NOTE — Assessment & Plan Note (Signed)
[facility]                            CARDIOLOGY OFFICE NOTE   NAME:[name], [name]                       MRN:          [id number]  DATE:09/27/2006                            DOB:          03-12-1946    [name] returns today for followup.  She was admitted to the  hospital with unstable angina on January 7.  She subsequently had  angioplasty with a drug-eluting stent to the mid LAD.  She has good LV  function.   At the time of her hospitalization, we wanted her off of Actos and she  has now been started on Glucophage 500 b.i.d.  Her blood sugars have  been better controlled.   In regards to her heart, she still has some soreness in her chest but no  active angina or exertional chest pain.   Her coronary risk factors included hypertension and diabetes.   She has been compliant with her medicines.  She notices some easy  bruising on her Plavix.  She is currently on:  1. Vytorin 10/40.  2. Hydrochlorothiazide 25 daily.  3. Atenolol 25 daily.  4. Fexofenadine 60 mg daily.  5. Metformin 500 b.i.d.  6. Aspirin daily.  7. Plavix 75 daily.   EXAM:  Blood pressure is 120/78.  Pulse 58 and regular.  HEENT:  Normal.  Carotids are normal.  LUNGS:  Clear.  There is a S1 and S2 with normal heart sounds.  ABDOMEN:  Benign.  Lower extremities intact pulses, no edema.  Groin site is well healed.   EKG is normal except for sinus bradycardia at a rate of 58.   IMPRESSION:  1. Stable single vessel disease status post drug-eluting stent to the      left anterior descending.  The patient declined cardiac      rehabilitation and is walking on the treadmill at home.  Overall, I      am pleased with the progress. She has good LV function.  I suspect      her hemoglobin A1c would be better on Glucophage and off Actos.  2. She will need a followup lipid and liver profile on Vytorin in 6      months.  We will continue her on aspirin, beta blocker, and  Plavix.      In regards to her coronary artery disease,  I suspect we will want      to do a followup Myoview in 6 months.   I will see her then.     [name] Eden Emms, MD, [facility]  Electronically Signed    PCN/MedQ  DD: 09/27/2006  DT: 09/27/2006  Job #: [id number]

## 2011-01-29 NOTE — Cardiovascular Report (Signed)
NAME:  [name] [name], [name] [name]              ACCOUNT NO.:  [id number]   MEDICAL RECORD NO.:  [id number]          PATIENT TYPE:  OIB   LOCATION:  1963                         FACILITY:  [facility]   PHYSICIAN:  [name] Eden Emms, MD, FACCDATE OF BIRTH:  03-Dec-1945   DATE OF PROCEDURE:  DATE OF DISCHARGE:                            CARDIAC CATHETERIZATION   Coronary arteriography   INDICATIONS:  65 year old diabetic with hypercholesterolemia, has been  having exertional chest pain and shortness of breath.  Myoview showing  inferior lateral wall ischemia with chest pain and positive ECG.   Cine catheterization was done with 4-French catheters from right femoral  artery.  We also did a right heart catheterization from the right  femoral vein due to the patient's exertional dyspnea.   Left main coronary artery was normal.   Left anterior descending artery had a 70-80% stenosis in the proximal to  mid LAD before the takeoff of a large second diagonal branch.  The  entire proximal LAD to the lesion was calcified and the distal LAD had  30% multiple lesions.   Circumflex coronary artery was nondominant.  The first obtuse marginal  branch was subtotally occluded but did have some antegrade flow.  The  native circ and second obtuse marginal branch had 20-30% multiple  lesions.   Right coronary artery was dominant.  There are 20-30% multiple lesions  in the proximal and distal vessel.   There were no right-to-left collaterals.   RAO ventriculography:  RAO ventriculography showed minimal inferobasal  wall hypokinesis.  EF was preserved at 60%.  There was no gradient  across the aortic valve and no MR.  LV pressure was 149/16.  Aortic  pressure was 148/70.   Right heart catheterization showed normal right-sided heart pressures  with no evidence of pulmonary hypertension.  Mean right atrial pressure  was 4, RV pressure was 28/5, PA pressure was 27/9, mean pulmonary  capillary wedge pressure was 11.   IMPRESSION:  I will review the films with Dr. Excell Seltzer.  The perfusion  study was abnormal in the inferolateral distribution.  However, this is  a relative perfusion study and I suspect since the OM was subtotally  occluded this  showed up.  I am concerned about the LAD.  It is calcified and in some  views looked closer to 70-80% stenosed.  My bias would be to load the  patient with Plavix and bring her back on Monday to Rotablator and/or  stent the LAD.   The patient tolerated the procedure well.      [name] Eden Emms, MD, [facility]  Electronically Signed     PCN/MEDQ  D:  09/15/2006  T:  09/15/2006  Job:  [id number]   cc:   Delaney Meigs, M.D.

## 2011-01-29 NOTE — Assessment & Plan Note (Signed)
[facility]                            CARDIOLOGY OFFICE NOTE   NAME:[name], [name]                       MRN:          [id number]  DATE:09/08/2006                            DOB:          June 15, 1946    [name] [name] is a 65 year old patient of Dr. Joyce Copa that I initially  evaluated on August 08, 2006. She had been having increasing chest  pain, shortness of breath over the last few months.   She has multiple coronary risk factors including hypertension and  diabetes. Diabetes has only been treated for 6-8 months.   She was initially scheduled for a stress test and an echocardiogram.   The patient's 2D echocardiogram showed normal LV function with mild left  atrial enlargement, no significant valvular disease. However her  exercise stress-test performed on August 29, 2006 was abnormal. She  exercised 7 minutes on standard Bruce protocol; she had a hypertensive  response to exercise with chest pain and a positive EKG. The  scintigraphic images suggested inferior lateral wall ischemia. In  talking to the patient since I last saw her, she continues to get chest  pain and shortness of breath particularly when she walks fast or does  housework.  Given her multiple coronary risk factors, I had a long  discussion with her and her husband regarding the need for right and  left heart catheterization.   The risks of catheterization including need for emergency surgery, dye  reaction, stroke, localized hematoma and bleeding were discussed. They  are willing to proceed. I went into detail with them regarding the  procedure since she has significant exertional dyspnea and central  obesity with a body habitus for sleep apnea, right heart catheterization  is also indicated.   MEDICATIONS:  1. Vytorin 10/40.  2. Actos 15 daily which may have to be changed, she has only been on      this for 6-8 months.  3. Hydrochlorothiazide 25 daily.  4. Atenolol 25  daily.  5. Fexofenadine 60 mg daily.  6. B12, vitamin C and fish oil.   She denies any history of dye reactions.   REVIEW OF SYSTEMS:  Otherwise remarkable for allergies.   PAST SURGICAL HISTORY:  Remarkable for a hysterectomy in 1984; rectocele  surgery in 1996; rotator cuff repair in 1990; lower back surgery in  2005, all of these were uneventful in regards to her heart.   SOCIAL HISTORY:  She is a retired Engineer, site since 2003 and she is  happily married.   FAMILY HISTORY:  Remarkable for mother having heart arrhythmias at age  9. Father having a heart attack at age 18. She has brothers and sisters  with cancer.   PHYSICAL EXAMINATION:  VITAL SIGNS: Her exam is remarkable for a weight  of 225 pounds. Blood pressure is 130/82, pulse 62 and regular.  HEENT: Normal.  NECK: There is no thyromegaly, no lymphadenopathy, no carotid bruits.  LUNGS: Clear.  CARDIOVASCULAR: There is an S1, S2 with normal heart sounds.  ABDOMEN: Benign, there are no renal bruits.  EXTREMITIES: Distal pulses are intact. No edema.  IMPRESSION:  A 65 year old diabetic with hypertension who is having  chest pain and exertional dyspnea. Abnormal stress-Myoview with chest  pain and abnormal EKG response and inferior lateral wall ischemia. Right  and left heart catheterization to be performed after the new year.  Sublingual nitroglycerin was given to the patient in case she had any  severe episodes. She will continue on her beta blocker and aspirin  therapy.   Further recommendations will based on the results of her heart  catheterization.     [name] Eden Emms, MD, [facility]  Electronically Signed    PCN/MedQ  DD: 09/13/2006  DT: 09/13/2006  Job #: [id number]

## 2011-01-29 NOTE — Assessment & Plan Note (Signed)
[facility]                            CARDIOLOGY OFFICE NOTE   NAME:[name] [name]                       MRN:          [id number]  DATE:12/29/2006                            DOB:          Feb 05, 1946    [name] [name] returns today for followup.  She has had a drug-eluting  stent to the LAD in January. She has preserved LV function. I believe  she had residual circ disease in her LAD and her intervention was  complex.  She needs an early followup stress test in about 6 months.  In  regard to her CAD there has been no angina, syncope, exertional dyspnea,  she has not taken nitro.   REVIEW OF SYSTEMS:  Is remarkable for the sensation like she can feel  her stent in her chest, particularly when her dog is laying on her.  She  is also convinced that full adult aspirin caused constipation which was  debilitating.  She has cut back her aspirin 81 mg in addition to her  Plavix and is on Metamucil and feels better.  Otherwise she has not had  any significant angina, PND or orthopnea, or palpitations.   The patient's current medications include:  1. Vytorin 10/40.  2. Hydrochlorothiazide 25 daily.  3. Haldol 25 daily.  4. B12.  5. Metformin 500 b.i.d.  6. Plavix 75 mg daily.  7. A baby aspirin.   She tells me her hemoglobin A1C has been in the 6.4 range.   Her exam is remarkable for a blood pressure of 120/70, pulse 70,  regular.  HEENT:  Is normal.  Carotids are normal without bruit.  LUNGS:  Are clear.  There is an S1, S2 with normal heart sounds.  ABDOMEN:  Is benign.  LOWER EXTREMITIES:  Intact pulse and no edema.   IMPRESSION:  Stable left anterior descending angioplasty with residual  circ disease.  Continue medical therapy, followup Myoview in 6 months.  The patient will have her lipid and liver profile checked in 6 months.  She will continue her Vytorin.  She will followup with her primary care  MD with quarterly hemoglobin A1Cs.  Her  weight is actually down 8 pounds  and I congratulated her on this.  I told her there was no harm in taking  a baby aspirin with her Plavix so long as it  improved her constipation.  Her 1 year anniversary date for her drug-  eluting stent will be January 2009.  I will see her back in 6 months.     [name] Eden Emms, MD, [facility]  Electronically Signed    PCN/MedQ  DD: 12/29/2006  DT: 12/30/2006  Job #: [id number]

## 2011-01-29 NOTE — Op Note (Signed)
[name], [name]              ACCOUNT NO.:  [id number]   MEDICAL RECORD NO.:  [id number]          PATIENT TYPE:  OIB   LOCATION:  3009                         FACILITY:  [facility]   PHYSICIAN:  Hilda Lias, M.D.   DATE OF BIRTH:  [birth year]   DATE OF PROCEDURE:  06/03/2004  DATE OF DISCHARGE:  06/04/2004                                 OPERATIVE REPORT   PREOPERATIVE DIAGNOSIS:  Left L5-S1 herniated disk with S1 radiculopathy.  Free fragment.   POSTOPERATIVE DIAGNOSIS:  Left L5-S1 herniated disk with S1 radiculopathy.  Free fragment.   OPERATION PERFORMED:  Left L5-S1 diskectomy, removal of soft herniated disk,  left calcified disk.  Total gross diskectomy.  Microscope.   SURGEON:  Hilda Lias, M.D.   ASSISTANT:  None.   ANESTHESIA:  General.   INDICATIONS FOR PROCEDURE:  The patient was admitted because of back and  left leg pain.  The patient had weakness of dorsiflexion and plantar flexion  of the left foot.  MRI showed a large herniated disk at the level 5-1.  Surgery was advised.  The risks were explained to the patient including the  possibility of CSF leak, worsening of the pain, paralysis, need for further  surgery which might require fusion.  Also infection and damage to the  viscera of the abdomen.   DESCRIPTION OF PROCEDURE:  The patient was taken to the operating room and  she was positioned in a prone manner.  The back was prepped with Betadine.  A midline incision from L5 to S1 was made.  Muscle was retracted laterally.  By x-ray we identified the area of 5-1.  We brought the microscope.  We  drilled the lower level of L5 and the upper of S1.  A thick yellow ligament  was also excised.  Indeed, we found that the thecal sac as well as the S1  nerve root was anchored to the floor.  Lysis was achieved.  We were able to  retract medially and there was a large fragment of disk going to the body of  S1.  Removal was made.  There was an opening in the disk.   There was some  calcified disk, mostly medial.  We entered the disk space and total gross  diskectomy was achieved.  Because of retraction there was a small pouch of  arachnoid.  A single stitch of Prolene was used followed by Tisseel.  Prior  to that we did a Valsalva maneuver, was negative.  Investigation of L5-S1  nerve root was negative.  From then on the area was irrigated and closed  with Vicryl and Steri-Strips.       EB/MEDQ  D:  06/03/2004  T:  06/04/2004  Job:  [id number]

## 2011-01-29 NOTE — Assessment & Plan Note (Signed)
[facility]                              CARDIOLOGY OFFICE NOTE   NAME:[name], [name]                       MRN:          [id number]  DATE:08/08/2006                            DOB:          [birth year]    [name] [name] is a delightful 65 year old patient of Dr. Lysbeth Galas.  She has  been having increasing chest pain with shortness of breath over the last  couple of months.   She has been treated for hypertension since the 1980s.  She has been on  Actos for about 6-8 months.   Her diabetes is relatively new.  She had previously had atypical chest pain;  however, over the summer months she seems to have had increasing pain that  is not always exertional but can be.  She seems to have had increasing  shortness of breath as well.  This sounds a bit like reactive airway  disease; however, it seems to have improved in the cooler weather.   The patient has never had a stress test.  There is no documented coronary  artery disease.   REVIEW OF SYSTEMS:  Remarkable for no history of DVT or PE.  No definitive  history of asthma or rheumatic heart disease.  She is not having any  palpitations, PND, orthopnea or syncope.   Coronary risk factors include positive family history, hypercholesterolemia,  diabetes, and hypertension.   She has had a previous hysterectomy in 1984, a rectocele surgery in 1996,  rotator cuff surgery in 1990, and lower back surgery in 2005.   FAMILY HISTORY:  Remarkable for her mother with a heart arrhythmia at age  69, father with heart attack at age 2.   Her first husband died of Doreatha Martin disease a few years ago.  She is  remarried.  There are no kids in the house.  Her second husband has heart  problems and sees Lyn Records, M.D.  she enjoys golfing and knitting.   She does not smoke and does not drink excessively.   MEDICATIONS:  1. Vytorin 10/40 mg.  2. Actos 15 mg a day.  3. Hydrochlorothiazide 25 mg a day.  4.  Atenolol 25 mg a day.  5. Fexofenadine 60 mg a day.  6. Fish oil.   She denies any significant allergies.   PHYSICAL EXAMINATION:  VITAL SIGNS:  Blood pressure is 120/70, the pulse is  60 and regular.  HEENT:  Normal.  There is no lymphadenopathy, no thyromegaly, no carotid  bruits.  LUNGS:  Clear.  CARDIAC:  Heart sounds are normal.  ABDOMEN:  Benign.  EXTREMITIES:  Intact pulses, no edema.  NEUROLOGIC:  Nonfocal.  SKIN:  Warm and dry.   EKG is normal.   IMPRESSION:  Diabetic with atypical chest pain and some dyspnea.   No obvious etiology to either based on EKG and clinical exam.  I am  particularly concerned about her multiple risk factors.  I think she needs a  stress Myoview study and would have a low threshold to perform  catheterization on her.  We will give her a prescription for  sublingual  nitroglycerin to take to see if this helps when she has episodes that are  not instantly relieved with rest.   She will have a 2 D echocardiogram due to her dyspnea.  Given her normal EKG  and exam, she should have normal left ventricular function but we will also  rule out occult valvular disease.  I will see her back after these tests to  see how her symptoms are.   Again, given her diabetes and multiple risk factors, we would have a low  threshold to catheterize her for any abnormalities on Myoview.   Her hypertension seems well-controlled and in the future if her diabetes is  difficult to control, her atenolol can be switched to Coreg for decreased  insulin resistance.  She will continue to stay on Vytorin for her  hypercholesterolemia, and these will be followed by Dr. Lysbeth Galas.   I suspect he is also checking her hemoglobin A1c quarterly.     [name] Pick. Eden Emms, MD, [facility]  Electronically Signed    PCN/MedQ  DD: 08/08/2006  DT: 08/08/2006  Job #: [id number]   cc:   Delaney Meigs, M.D.

## 2011-06-04 LAB — URINALYSIS, ROUTINE W REFLEX MICROSCOPIC
Glucose, UA: NEGATIVE
Ketones, ur: NEGATIVE
pH: 5

## 2011-06-04 LAB — URINE MICROSCOPIC-ADD ON

## 2014-05-09 ENCOUNTER — Emergency Department (HOSPITAL_COMMUNITY)
Admission: EM | Admit: 2014-05-09 | Discharge: 2014-05-09 | Disposition: A | Discharge status: Home / Self Care | Payer: Medicare Other | Attending: Emergency Medicine | Admitting: Emergency Medicine

## 2014-05-09 ENCOUNTER — Encounter (HOSPITAL_COMMUNITY): Payer: Self-pay | Admitting: Emergency Medicine

## 2014-05-09 DIAGNOSIS — N390 Urinary tract infection, site not specified: Secondary | ICD-10-CM | POA: Diagnosis not present

## 2014-05-09 DIAGNOSIS — I10 Essential (primary) hypertension: Secondary | ICD-10-CM | POA: Insufficient documentation

## 2014-05-09 DIAGNOSIS — Z79899 Other long term (current) drug therapy: Secondary | ICD-10-CM | POA: Diagnosis not present

## 2014-05-09 DIAGNOSIS — R300 Dysuria: Secondary | ICD-10-CM | POA: Insufficient documentation

## 2014-05-09 HISTORY — DX: Essential (primary) hypertension: I10

## 2014-05-09 LAB — CBC WITH DIFFERENTIAL/PLATELET
Basophils Absolute: 0 10*3/uL (ref 0.0–0.1)
Basophils Relative: 0 % (ref 0–1)
EOS PCT: 1 % (ref 0–5)
Eosinophils Absolute: 0.1 10*3/uL (ref 0.0–0.7)
HEMATOCRIT: 45.2 % (ref 36.0–46.0)
Hemoglobin: 15.7 g/dL — ABNORMAL HIGH (ref 12.0–15.0)
Lymphocytes Relative: 14 % (ref 12–46)
Lymphs Abs: 1.5 10*3/uL (ref 0.7–4.0)
MCH: 30.1 pg (ref 26.0–34.0)
MCHC: 34.7 g/dL (ref 30.0–36.0)
MCV: 86.6 fL (ref 78.0–100.0)
Monocytes Absolute: 0.7 10*3/uL (ref 0.1–1.0)
Monocytes Relative: 7 % (ref 3–12)
Neutro Abs: 8.8 10*3/uL — ABNORMAL HIGH (ref 1.7–7.7)
Neutrophils Relative %: 78 % — ABNORMAL HIGH (ref 43–77)
PLATELETS: 183 10*3/uL (ref 150–400)
RBC: 5.22 MIL/uL — AB (ref 3.87–5.11)
RDW: 12.5 % (ref 11.5–15.5)
WBC: 11.1 10*3/uL — AB (ref 4.0–10.5)

## 2014-05-09 LAB — BASIC METABOLIC PANEL
Anion gap: 13 (ref 5–15)
BUN: 15 mg/dL (ref 6–23)
CALCIUM: 9.2 mg/dL (ref 8.4–10.5)
CO2: 28 meq/L (ref 19–32)
Chloride: 102 mEq/L (ref 96–112)
Creatinine, Ser: 0.84 mg/dL (ref 0.50–1.10)
GFR calc Af Amer: 82 mL/min — ABNORMAL LOW (ref >90)
GFR calc non Af Amer: 70 mL/min — ABNORMAL LOW (ref >90)
Glucose, Bld: 127 mg/dL — ABNORMAL HIGH (ref 70–99)
Potassium: 3.7 mEq/L (ref 3.7–5.3)
SODIUM: 143 meq/L (ref 137–147)

## 2014-05-09 LAB — URINALYSIS, ROUTINE W REFLEX MICROSCOPIC
GLUCOSE, UA: 250 mg/dL — AB
KETONES UR: 15 mg/dL — AB
Nitrite: POSITIVE — AB
PH: 7 (ref 5.0–8.0)
Protein, ur: >300 mg/dL — AB
Specific Gravity, Urine: 1.015 (ref 1.005–1.030)
Urobilinogen, UA: >8 mg/dL — ABNORMAL HIGH (ref 0.0–1.0)

## 2014-05-09 LAB — URINE MICROSCOPIC-ADD ON

## 2014-05-09 MED ORDER — DEXTROSE 5 % IV SOLN
1.0000 g | Freq: Once | INTRAVENOUS | Status: AC
  Administered 2014-05-09: 1 g via INTRAVENOUS
  Filled 2014-05-09: qty 10

## 2014-05-09 MED ORDER — HYDROCODONE-ACETAMINOPHEN 5-325 MG PO TABS
1.0000 | ORAL_TABLET | Freq: Once | ORAL | Status: AC
  Administered 2014-05-09: 1 via ORAL
  Filled 2014-05-09: qty 1

## 2014-05-09 MED ORDER — PHENAZOPYRIDINE HCL 100 MG PO TABS
200.0000 mg | ORAL_TABLET | Freq: Once | ORAL | Status: AC
  Administered 2014-05-09: 200 mg via ORAL
  Filled 2014-05-09: qty 2

## 2014-05-09 MED ORDER — CEPHALEXIN 500 MG PO CAPS
500.0000 mg | ORAL_CAPSULE | Freq: Four times a day (QID) | ORAL | Status: DC
Start: 2014-05-09 — End: 2016-08-16

## 2014-05-09 MED ORDER — PHENAZOPYRIDINE HCL 100 MG PO TABS: 200.0000 mg | ORAL_TABLET | Freq: Three times a day (TID) | ORAL | Status: DC | PRN

## 2014-05-09 NOTE — ED Provider Notes (Signed)
CSN: [id number]     Arrival date & time 05/09/14  0405 History   First MD Initiated Contact with Patient 05/09/14 [phone]     Chief Complaint  Patient presents with  . Dysuria     (Consider location/radiation/quality/duration/timing/severity/associated sxs/prior Treatment) HPI  This is a 68 year old female with history of hypertension and recurrent UTIs who presents with dysuria, hematuria, and suprapubic pressure. Patient reports onset of symptoms for 5 days ago. She states she she's had persistent pressure over her bladder, frequency, and dysuria. However, tonight her symptoms got acutely worse. She noted gross blood in her urine. She denies any fevers or flank pain. Currently she rates her pain at 10 out of 10. It is the suprapubic region and comes in waves. She has not taken anything for the pain.   Past Medical History  Diagnosis Date  . Hypertension    History reviewed. No pertinent past surgical history. No family history on file. History  Substance Use Topics  . Smoking status: Never Smoker   . Smokeless tobacco: Not on file  . Alcohol Use: No   OB History   Grav Para Term Preterm Abortions TAB SAB Ect Mult Living                 Review of Systems  Constitutional: Negative for fever.  Respiratory: Negative for chest tightness and shortness of breath.   Cardiovascular: Negative for chest pain.  Gastrointestinal: Negative for nausea, vomiting and abdominal pain.  Genitourinary: Positive for dysuria, urgency, frequency, hematuria and pelvic pain. Negative for flank pain.  Musculoskeletal: Negative for back pain.  All other systems reviewed and are negative.     Allergies  Review of patient's allergies indicates no known allergies.  Home Medications   Prior to Admission medications   Medication Sig Start Date End Date Taking? Authorizing Provider  atenolol (TENORMIN) 25 MG tablet Take by mouth daily.   Yes Historical Provider, MD  lisinopril (PRINIVIL,ZESTRIL) 10  MG tablet Take 10 mg by mouth daily.   Yes Historical Provider, MD  cephALEXin (KEFLEX) 500 MG capsule Take 1 capsule (500 mg total) by mouth 4 (four) times daily. 05/09/14   Shon Baton, MD  phenazopyridine (PYRIDIUM) 100 MG tablet Take 2 tablets (200 mg total) by mouth 3 (three) times daily as needed for pain. 05/09/14   Shon Baton, MD   BP 164/89  Pulse 75  Temp(Src) 98.7 F (37.1 C) (Oral)  Resp 22  Ht  (1.651 m)  Wt 198 lb (89.812 kg)  BMI 32.95 kg/m2  SpO2 99% Physical Exam  Nursing note and vitals reviewed. Constitutional: She is oriented to person, place, and time.  Uncomfortable appearing  HENT:  Head: Normocephalic and atraumatic.  Cardiovascular: Normal rate, regular rhythm and normal heart sounds.   No murmur heard. Pulmonary/Chest: Effort normal and breath sounds normal. No respiratory distress. She has no wheezes.  Abdominal: Soft. Bowel sounds are normal. There is tenderness. There is no rebound and no guarding.  Tenderness to palpation of the suprapubic region without rebound or guarding, no CVA tenderness noted  Neurological: She is alert and oriented to person, place, and time.  Skin: Skin is warm and dry.  Psychiatric: She has a normal mood and affect.    ED Course  Procedures (including critical care time) Labs Review Labs Reviewed  CBC WITH DIFFERENTIAL - Abnormal; Notable for the following:    WBC 11.1 (*)    RBC 5.22 (*)    Hemoglobin 15.7 (*)  Neutrophils Relative % 78 (*)    Neutro Abs 8.8 (*)    All other components within normal limits  BASIC METABOLIC PANEL - Abnormal; Notable for the following:    Glucose, Bld 127 (*)    GFR calc non Af Amer 70 (*)    GFR calc Af Amer 82 (*)    All other components within normal limits  URINALYSIS, ROUTINE W REFLEX MICROSCOPIC - Abnormal; Notable for the following:    Color, Urine RED (*)    APPearance CLOUDY (*)    Glucose, UA 250 (*)    Hgb urine dipstick LARGE (*)    Bilirubin Urine  LARGE (*)    Ketones, ur 15 (*)    Protein, ur >300 (*)    Urobilinogen, UA >8.0 (*)    Nitrite POSITIVE (*)    Leukocytes, UA LARGE (*)    All other components within normal limits  URINE MICROSCOPIC-ADD ON - Abnormal; Notable for the following:    Squamous Epithelial / LPF FEW (*)    Bacteria, UA FEW (*)    Casts GRANULAR CAST (*)    All other components within normal limits  URINE CULTURE    Imaging Review No results found.   EKG Interpretation None      MDM   Final diagnoses:  UTI (lower urinary tract infection)    Patient presents with symptoms consistent with her prior UTIs. Denies any fevers or systemic symptoms. She is uncomfortable on exam. Patient was given Pyridium and Norco. Urinalysis as well as basic labwork was obtained. She is nitrite-positive urine with large blood and large leuks. Urine culture sent. Patient was given IV Rocephin. Basic lab work is largely reassuring with normal renal function. She does have mild leukocytosis. Given this, discussed with patient longer seven-day course of antibiotics. While she has not had any CVA tenderness or fevers, given leukocytosis would be concerned about early upper urinary tract infection. Will place on Keflex. Patient will also be discharged with prescription for Pyridium.  No prior urine cultures on file to guide antibiotic management.  After history, exam, and medical workup I feel the patient has been appropriately medically screened and is safe for discharge home. Pertinent diagnoses were discussed with the patient. Patient was given return precautions.     Shon Baton, MD 05/09/14 [phone]

## 2014-05-09 NOTE — Discharge Instructions (Signed)

## 2014-05-09 NOTE — ED Notes (Signed)
MD at bedside. 

## 2014-05-09 NOTE — ED Notes (Signed)
Pain with urination, and also passing some blood, frequency

## 2014-05-12 LAB — URINE CULTURE

## 2014-05-13 ENCOUNTER — Telehealth (HOSPITAL_BASED_OUTPATIENT_CLINIC_OR_DEPARTMENT_OTHER): Payer: Self-pay | Admitting: Emergency Medicine

## 2014-05-13 NOTE — Telephone Encounter (Signed)
Post ED Visit - Positive Culture Follow-up  Culture report reviewed by antimicrobial stewardship pharmacist:  Wes Dulaney, Pharm.D., BCPS  Celedonio Miyamoto, Pharm.D., BCPS  Georgina Pillion, [address].D., BCPS  [place], [place].D., BCPS, AAHIVP  Estella Husk, Pharm.D., BCPS, AAHIVP  Red Christians, Pharm.D.  Tennis Must, Pharm.D.  Positive urine culture >100,000 colonies/ml E. Coli Treated with cephalexin  po caps qid x 7 days, organism sensitive to the same and no further patient follow-up is required at this time.  Berle Mull 05/13/2014, 5:02 PM

## 2016-08-13 DIAGNOSIS — I441 Atrioventricular block, second degree: Secondary | ICD-10-CM

## 2016-08-13 HISTORY — DX: Atrioventricular block, second degree: I44.1

## 2016-08-16 ENCOUNTER — Inpatient Hospital Stay (HOSPITAL_COMMUNITY)
Admission: EM | Admit: 2016-08-16 | Discharge: 2016-08-18 | DRG: 244 | Disposition: A | Discharge status: Home / Self Care | Payer: Medicare Other | Attending: Cardiovascular Disease | Admitting: Cardiovascular Disease

## 2016-08-16 ENCOUNTER — Emergency Department (HOSPITAL_COMMUNITY): Payer: Medicare Other

## 2016-08-16 ENCOUNTER — Encounter (HOSPITAL_COMMUNITY): Payer: Self-pay | Admitting: Emergency Medicine

## 2016-08-16 DIAGNOSIS — Z90710 Acquired absence of both cervix and uterus: Secondary | ICD-10-CM | POA: Diagnosis not present

## 2016-08-16 DIAGNOSIS — Z8249 Family history of ischemic heart disease and other diseases of the circulatory system: Secondary | ICD-10-CM | POA: Diagnosis not present

## 2016-08-16 DIAGNOSIS — E7800 Pure hypercholesterolemia, unspecified: Secondary | ICD-10-CM | POA: Diagnosis present

## 2016-08-16 DIAGNOSIS — I442 Atrioventricular block, complete: Principal | ICD-10-CM | POA: Diagnosis present

## 2016-08-16 DIAGNOSIS — Z833 Family history of diabetes mellitus: Secondary | ICD-10-CM | POA: Diagnosis not present

## 2016-08-16 DIAGNOSIS — E876 Hypokalemia: Secondary | ICD-10-CM | POA: Diagnosis present

## 2016-08-16 DIAGNOSIS — Z7982 Long term (current) use of aspirin: Secondary | ICD-10-CM | POA: Diagnosis not present

## 2016-08-16 DIAGNOSIS — Z950 Presence of cardiac pacemaker: Secondary | ICD-10-CM

## 2016-08-16 DIAGNOSIS — I441 Atrioventricular block, second degree: Secondary | ICD-10-CM | POA: Diagnosis present

## 2016-08-16 DIAGNOSIS — R001 Bradycardia, unspecified: Secondary | ICD-10-CM | POA: Diagnosis present

## 2016-08-16 DIAGNOSIS — Z955 Presence of coronary angioplasty implant and graft: Secondary | ICD-10-CM

## 2016-08-16 DIAGNOSIS — Z79899 Other long term (current) drug therapy: Secondary | ICD-10-CM

## 2016-08-16 DIAGNOSIS — K219 Gastro-esophageal reflux disease without esophagitis: Secondary | ICD-10-CM | POA: Diagnosis present

## 2016-08-16 DIAGNOSIS — Z7984 Long term (current) use of oral hypoglycemic drugs: Secondary | ICD-10-CM | POA: Diagnosis not present

## 2016-08-16 DIAGNOSIS — I10 Essential (primary) hypertension: Secondary | ICD-10-CM | POA: Diagnosis present

## 2016-08-16 DIAGNOSIS — R55 Syncope and collapse: Secondary | ICD-10-CM | POA: Diagnosis not present

## 2016-08-16 DIAGNOSIS — E669 Obesity, unspecified: Secondary | ICD-10-CM | POA: Diagnosis present

## 2016-08-16 DIAGNOSIS — Z6835 Body mass index (BMI) 35.0-35.9, adult: Secondary | ICD-10-CM

## 2016-08-16 DIAGNOSIS — I2510 Atherosclerotic heart disease of native coronary artery without angina pectoris: Secondary | ICD-10-CM | POA: Diagnosis present

## 2016-08-16 DIAGNOSIS — E119 Type 2 diabetes mellitus without complications: Secondary | ICD-10-CM | POA: Diagnosis present

## 2016-08-16 HISTORY — DX: Type 2 diabetes mellitus without complications: E11.9

## 2016-08-16 HISTORY — DX: Presence of cardiac pacemaker: Z95.0

## 2016-08-16 HISTORY — DX: Atrioventricular block, second degree: I44.1

## 2016-08-16 HISTORY — DX: Atherosclerotic heart disease of native coronary artery without angina pectoris: I25.10

## 2016-08-16 HISTORY — DX: Pure hypercholesterolemia, unspecified: E78.00

## 2016-08-16 LAB — COMPREHENSIVE METABOLIC PANEL
ALT: 25 U/L (ref 14–54)
AST: 21 U/L (ref 15–41)
Albumin: 4.2 g/dL (ref 3.5–5.0)
Alkaline Phosphatase: 66 U/L (ref 38–126)
Anion gap: 9 (ref 5–15)
BILIRUBIN TOTAL: 0.8 mg/dL (ref 0.3–1.2)
BUN: 16 mg/dL (ref 6–20)
CALCIUM: 9.1 mg/dL (ref 8.9–10.3)
CO2: 26 mmol/L (ref 22–32)
Chloride: 103 mmol/L (ref 101–111)
Creatinine, Ser: 1.03 mg/dL — ABNORMAL HIGH (ref 0.44–1.00)
GFR, EST NON AFRICAN AMERICAN: 54 mL/min — AB (ref >60)
Glucose, Bld: 139 mg/dL — ABNORMAL HIGH (ref 65–99)
Potassium: 3.8 mmol/L (ref 3.5–5.1)
Sodium: 138 mmol/L (ref 135–145)
TOTAL PROTEIN: 7.4 g/dL (ref 6.5–8.1)

## 2016-08-16 LAB — CBC WITH DIFFERENTIAL/PLATELET
Basophils Absolute: 0 10*3/uL (ref 0.0–0.1)
Basophils Relative: 0 %
EOS PCT: 1 %
Eosinophils Absolute: 0.1 10*3/uL (ref 0.0–0.7)
HEMATOCRIT: 42.5 % (ref 36.0–46.0)
Hemoglobin: 14.2 g/dL (ref 12.0–15.0)
LYMPHS ABS: 1.8 10*3/uL (ref 0.7–4.0)
LYMPHS PCT: 19 %
MCH: 30.1 pg (ref 26.0–34.0)
MCHC: 33.4 g/dL (ref 30.0–36.0)
MCV: 90 fL (ref 78.0–100.0)
Monocytes Absolute: 0.7 10*3/uL (ref 0.1–1.0)
Monocytes Relative: 8 %
NEUTROS ABS: 6.6 10*3/uL (ref 1.7–7.7)
Neutrophils Relative %: 72 %
PLATELETS: 175 10*3/uL (ref 150–400)
RBC: 4.72 MIL/uL (ref 3.87–5.11)
RDW: 12.9 % (ref 11.5–15.5)
WBC: 9.2 10*3/uL (ref 4.0–10.5)

## 2016-08-16 LAB — GLUCOSE, CAPILLARY: GLUCOSE-CAPILLARY: 114 mg/dL — AB (ref 65–99)

## 2016-08-16 LAB — TROPONIN I: Troponin I: <0.03 ng/mL (ref <0.03)

## 2016-08-16 MED ORDER — INSULIN ASPART 100 UNIT/ML ~~LOC~~ SOLN
1.0000 [IU] | SUBCUTANEOUS | Status: DC
  Administered 2016-08-17: 1 [IU] via SUBCUTANEOUS
  Administered 2016-08-17: 2 [IU] via SUBCUTANEOUS
  Administered 2016-08-17: 1 [IU] via SUBCUTANEOUS
  Administered 2016-08-18: 2 [IU] via SUBCUTANEOUS
  Administered 2016-08-18: 1 [IU] via SUBCUTANEOUS

## 2016-08-16 MED ORDER — ASPIRIN EC 81 MG PO TBEC
81.0000 mg | DELAYED_RELEASE_TABLET | Freq: Every day | ORAL | Status: DC
  Administered 2016-08-16 – 2016-08-17 (×2): 81 mg via ORAL
  Filled 2016-08-16 (×2): qty 1

## 2016-08-16 MED ORDER — IBUPROFEN 200 MG PO TABS
400.0000 mg | ORAL_TABLET | Freq: Once | ORAL | Status: AC
  Administered 2016-08-16: 400 mg via ORAL
  Filled 2016-08-16: qty 2

## 2016-08-16 MED ORDER — ATROPINE SULFATE 1 MG/ML IJ SOLN
0.4000 mg | Freq: Once | INTRAMUSCULAR | Status: AC | PRN
  Administered 2016-08-16: 0.4 mg via INTRAVENOUS
  Filled 2016-08-16: qty 1

## 2016-08-16 MED ORDER — ROSUVASTATIN CALCIUM 10 MG PO TABS
40.0000 mg | ORAL_TABLET | Freq: Every day | ORAL | Status: DC
  Administered 2016-08-16 – 2016-08-17 (×2): 40 mg via ORAL
  Filled 2016-08-16 (×2): qty 4

## 2016-08-16 MED ORDER — HYDRALAZINE HCL 20 MG/ML IJ SOLN: 2.0000 mg | Freq: Once | INTRAMUSCULAR | Status: DC

## 2016-08-16 MED ORDER — CAPTOPRIL 12.5 MG PO TABS
12.5000 mg | ORAL_TABLET | Freq: Three times a day (TID) | ORAL | Status: DC
  Administered 2016-08-16: 12.5 mg via ORAL
  Filled 2016-08-16: qty 1

## 2016-08-16 MED ORDER — PANTOPRAZOLE SODIUM 40 MG PO TBEC
40.0000 mg | DELAYED_RELEASE_TABLET | Freq: Every day | ORAL | Status: DC
Start: 2016-08-17 — End: 2016-08-18
  Administered 2016-08-18: 40 mg via ORAL
  Filled 2016-08-16 (×2): qty 1

## 2016-08-16 MED ORDER — ATROPINE SULFATE 0.4 MG/ML IJ SOLN
0.4000 mg | Freq: Once | INTRAMUSCULAR | Status: DC | PRN
  Filled 2016-08-16: qty 1

## 2016-08-16 NOTE — H&P (Addendum)
RFA: heart block  HPI:  70yoF with hx of HTN, HLD, DM2, CAD s/ps stent, presents with fatigue, malaise, and SOB, and is found to be hypertensive with complete heart block.  Patient states she has had poor exercise tolerance over the past two months, such that she can no longer exercise.  These symptoms have worsened over the past two weeks to the point that she sought medical care today.  She denies chest pain, syncope, recent fevers, and abx use.  She does endorse chills.  She is diabetic and has her feet routinely inspected for ulcers.   ROS: - notable for nasal congestion/cold symptoms - denies fevers, recent abx use, skin ulcers - 14 point ros otherwise unremarkable  PMH Past Medical History:  Diagnosis Date  . High cholesterol   . Hypertension   CAD s/p stent  Medications Metformin HCTZ Lisinopril Atenolol Statin  Allergy No Known Allergies  FHx: Son - died suddenly of MI  Exam BP (!) 199/59 (BP Location: Left Arm)   Pulse (!) 41   Temp 98 F (36.7 C) (Oral)   Resp 16   Ht 5\' 5"  (1.651 m)   Wt 96.6 kg (212 lb 14.4 oz)   SpO2 96%   BMI 35.43 kg/m  Obese 70yo in NAD on 2L Bernice JVP normal CTAB Bradycardic, no murmur Soft nt nd No edema, feet without ulcer AAO without gross focal deficit  ECG Sinus rhythm with 2:1 AVB, ventricular rate 41.  QRS is narrow  Tele: 2:1 AV block.  No improvement in AV conduction with atropine  Labs: 11.1 >--------< 183             45.2  143   102    15 -----------------------< 127 3.7     28     0.84  A/P 70yoF with hx of HTN, HLD, DM2, CAD s/ps stent, presents with fatigue, malaise, and SOB, and is found to be hypertensive with complete heart block.  High degree AV heart block 1.  Clinically stable but with hypertensive response. -  Keep pads on place, atropine at bed side, treat BP with low dose captopril, goal SBP 160 -  Stop atenolol.  Can wait 24 hours off this medication, but conduction is unlikely to  recover -  Needs Echo or cMRI in AM to evaluate LV function but also to look for causes of complete heart block (sarcoid, scar, infiltration, etc) -  Check tsh - (of note, atropine given by nursing staff without improvement of HR suggesting that despite improved AV conduction, heart block persisted. This suggests infra-His disease)  2.  Likely DC PM -  Discussed procedure with patient -  Keep npo after midnight -  check labs in AM -  Pre-procedural CXR ordered -  Unlikely that LV function is down as QRS is narrow, but if LVEF is 50 or lower would recommend BiV device  3.  DM - hold metformin, insulin ss   F/E/N - diabetic diet, no IVF  FULL CODE

## 2016-08-16 NOTE — ED Provider Notes (Signed)
[facility]-EMERGENCY DEPT Provider Note   CSN: [id number] Arrival date & time: 08/16/16  1514     History   Chief Complaint Chief Complaint  Patient presents with  . Shortness of Breath    HPI [name] [name] is a 70 y.o. female.  You referred in from primary care office for 3 month history of shortness of breath. Worsened last 2 weeks. No chest pain. EKG in the office showed a 21 AV block.      Past Medical History:  Diagnosis Date  . High cholesterol   . Hypertension     Patient Active Problem List   Diagnosis Date Noted  . AV block, 2nd degree 08/16/2016  . CAD 04/18/2009  . HYPERCHOLESTEROLEMIA 03/01/2009  . HYPERTENSION 03/01/2009  . GERD 03/01/2009  . UTI 03/01/2009  . 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 EDEMA 03/01/2009  . MURMUR 03/01/2009  . SHORTNESS OF BREATH 03/01/2009  . IRRITABLE BOWEL SYNDROME, HX OF 03/01/2009    Past Surgical History:  Procedure Laterality Date  . ABDOMINAL HYSTERECTOMY    . FOOT SURGERY    . KNEE ARTHROSCOPY    . ROTATOR CUFF REPAIR      OB History    No data available       Home Medications    Prior to Admission medications   Medication Sig Start Date End Date Taking? Authorizing Provider  aspirin EC 81 MG tablet Take 81 mg by mouth at bedtime.   Yes Historical Provider, MD  atenolol (TENORMIN) 50 MG tablet Take 25 mg by mouth every morning.  04/29/15  Yes Historical Provider, MD  Cholecalciferol (VITAMIN D3) 1000 units CAPS Take 2,000 Units by mouth every morning.    Yes Historical Provider, MD  lisinopril-hydrochlorothiazide (PRINZIDE,ZESTORETIC) 10-12.5 MG tablet Take 1 tablet by mouth every morning.  08/16/16  Yes Historical Provider, MD  metFORMIN (GLUCOPHAGE-XR) 500 MG 24 hr tablet Take 500 mg by mouth 2 (two) times daily.  07/12/16  Yes Historical Provider, MD  omeprazole (PRILOSEC) 20 MG capsule Take 20 mg by mouth every morning.  08/16/16  Yes Historical Provider, MD  rosuvastatin (CRESTOR) 40 MG tablet Take 40 mg  by mouth at bedtime.  08/16/16  Yes Historical Provider, MD    Family History History reviewed. No pertinent family history.  Social History Social History  Substance Use Topics  . Smoking status: Never Smoker  . Smokeless tobacco: Never Used  . Alcohol use No     Allergies   Patient has no known allergies.   Review of Systems Review of Systems  Constitutional: Positive for fatigue. Negative for fever.  HENT: Positive for congestion.   Eyes: Negative for redness.  Respiratory: Positive for shortness of breath.   Cardiovascular: Negative for chest pain and leg swelling.  Gastrointestinal: Negative for abdominal pain.  Genitourinary: Negative for dysuria.  Musculoskeletal: Negative for back pain.  Skin: Negative for rash.  Neurological: Negative for syncope.  Hematological: Does not bruise/bleed easily.  Psychiatric/Behavioral: Negative for confusion.     Physical Exam Updated Vital Signs BP 185/66   Pulse (!) 42   Temp 98.5 F (36.9 C) (Temporal)   Resp 16   Ht 5\' 5"  (1.651 m)   Wt 95.3 kg   SpO2 99%   BMI 34.95 kg/m   Physical Exam  Constitutional: She is oriented to person, place, and time. She appears well-developed and well-nourished. No distress.  HENT:  Head: Normocephalic and atraumatic.  Mouth/Throat: Oropharynx is clear and moist.  Eyes: Conjunctivae  and EOM are normal.  Neck: Normal range of motion. Neck supple.  Cardiovascular: Regular rhythm and normal heart sounds.   Pulmonary/Chest: Effort normal and breath sounds normal. No respiratory distress.  Abdominal: Soft. Bowel sounds are normal. There is no tenderness.  Musculoskeletal: Normal range of motion.  Neurological: She is alert and oriented to person, place, and time. No cranial nerve deficit or sensory deficit. She exhibits normal muscle tone. Coordination normal.  Skin: Skin is warm. No erythema.  Nursing note and vitals reviewed.    ED Treatments / Results  Labs (all labs ordered  are listed, but only abnormal results are displayed) Labs Reviewed  COMPREHENSIVE METABOLIC PANEL - Abnormal; Notable for the following:       Result Value   Glucose, Bld 139 (*)    Creatinine, Ser 1.03 (*)    GFR calc non Af Amer 54 (*)    All other components within normal limits  CBC WITH DIFFERENTIAL/PLATELET  TROPONIN I   Results for orders placed or performed during the hospital encounter of 08/16/16  CBC with Differential  Result Value Ref Range   WBC 9.2 4.0 - 10.5 K/uL   RBC 4.72 3.87 - 5.11 MIL/uL   Hemoglobin 14.2 12.0 - 15.0 g/dL   HCT 21.3 08.6 - 57.8 %   MCV 90.0 78.0 - 100.0 fL   MCH 30.1 26.0 - 34.0 pg   MCHC 33.4 30.0 - 36.0 g/dL   RDW 46.9 62.9 - 52.8 %   Platelets 175 150 - 400 K/uL   Neutrophils Relative % 72 %   Neutro Abs 6.6 1.7 - 7.7 K/uL   Lymphocytes Relative 19 %   Lymphs Abs 1.8 0.7 - 4.0 K/uL   Monocytes Relative 8 %   Monocytes Absolute 0.7 0.1 - 1.0 K/uL   Eosinophils Relative 1 %   Eosinophils Absolute 0.1 0.0 - 0.7 K/uL   Basophils Relative 0 %   Basophils Absolute 0.0 0.0 - 0.1 K/uL  Comprehensive metabolic panel  Result Value Ref Range   Sodium 138 135 - 145 mmol/L   Potassium 3.8 3.5 - 5.1 mmol/L   Chloride 103 101 - 111 mmol/L   CO2 26 22 - 32 mmol/L   Glucose, Bld 139 (H) 65 - 99 mg/dL   BUN 16 6 - 20 mg/dL   Creatinine, Ser 4.13 (H) 0.44 - 1.00 mg/dL   Calcium 9.1 8.9 - 24.4 mg/dL   Total Protein 7.4 6.5 - 8.1 g/dL   Albumin 4.2 3.5 - 5.0 g/dL   AST 21 15 - 41 U/L   ALT 25 14 - 54 U/L   Alkaline Phosphatase 66 38 - 126 U/L   Total Bilirubin 0.8 0.3 - 1.2 mg/dL   GFR calc non Af Amer 54 (L) >60 mL/min   GFR calc Af Amer >60 >60 mL/min   Anion gap 9 5 - 15  Troponin I  Result Value Ref Range   Troponin I <0.03 <0.03 ng/mL    EKG  EKG Interpretation  Date/Time:  Monday August 16 2016 15:19:37 EST Ventricular Rate:  41 PR Interval:  184 QRS Duration: 94 QT Interval:  522 QTC Calculation: 430 R Axis:   33 Text  Interpretation:  AV Block Sinus rhythm with 2nd degree A-V block with 2:1 A-V conduction Abnormal ECG Confirmed by Deretha Emory  MD, Eufemio Strahm ([id number]) on 08/16/2016 3:51:22 PM       Radiology Dg Chest Portable 1 View  Result Date: 08/16/2016 CLINICAL DATA:  Shortness of  breath. Difficulty breathing for 3 months. EXAM: PORTABLE CHEST 1 VIEW COMPARISON:  09/14/2016 FINDINGS: The heart size and mediastinal contours are within normal limits. Both lungs are clear. The visualized skeletal structures are unremarkable. IMPRESSION: No active disease. Electronically Signed   By: Signa Kellaylor  Stroud M.D.   On: 08/16/2016 15:44    Procedures Procedures (including critical care time)  CRITICAL CARE Performed by: Vanetta MuldersZACKOWSKI,Tinsleigh Slovacek Total critical care time: 30 minutes Critical care time was exclusive of separately billable procedures and treating other patients. Critical care was necessary to treat or prevent imminent or life-threatening deterioration. Critical care was time spent personally by me on the following activities: development of treatment plan with patient and/or surrogate as well as nursing, discussions with consultants, evaluation of patient's response to treatment, examination of patient, obtaining history from patient or surrogate, ordering and performing treatments and interventions, ordering and review of laboratory studies, ordering and review of radiographic studies, pulse oximetry and re-evaluation of patient's condition.   Medications Ordered in ED Medications - No data to display   Initial Impression / Assessment and Plan / ED Course  I have reviewed the triage vital signs and the nursing notes.  Pertinent labs & imaging results that were available during my care of the patient were reviewed by me and considered in my medical decision making (see chart for details).  Clinical Course     Patient with new onset of A-V 2-1 block. [facility]. She will accept  the patient in transfer to telemetry. Patient currently stable. Patient does have pacer pads in place. Patient with three-week history of shortness of breath got worse in the last 2 weeks. Patient went to primary care doctor today for the first time of evaluation. Showed the 2-1 block. Patient 10 years ago had stents placed. Patient denies any chest pain. Labs here without any significant abnormalities. I should saturations are in the upper 90s on 2 L. Patient was not hypoxic on presentation.  Final Clinical Impressions(s) / ED Diagnoses   Final diagnoses:  AV block, 2nd degree    New Prescriptions New Prescriptions   No medications on file     Vanetta MuldersScott Skyy Mcknight, MD 08/16/16 1750

## 2016-08-16 NOTE — ED Triage Notes (Addendum)
Patient sent from PCP for bradycardia with heart rate 35-39 bpm. Patient complaining of shortness of breath x 2 weeks.

## 2016-08-17 ENCOUNTER — Inpatient Hospital Stay (HOSPITAL_COMMUNITY): Payer: Medicare Other

## 2016-08-17 ENCOUNTER — Encounter (HOSPITAL_COMMUNITY): Payer: Self-pay | Admitting: Physician Assistant

## 2016-08-17 ENCOUNTER — Other Ambulatory Visit (HOSPITAL_COMMUNITY): Payer: Medicare Other

## 2016-08-17 ENCOUNTER — Encounter (HOSPITAL_COMMUNITY)
Admission: EM | Disposition: A | Discharge status: Home / Self Care | Payer: Self-pay | Source: Home / Self Care | Attending: Cardiovascular Disease

## 2016-08-17 DIAGNOSIS — I441 Atrioventricular block, second degree: Secondary | ICD-10-CM

## 2016-08-17 DIAGNOSIS — Z950 Presence of cardiac pacemaker: Secondary | ICD-10-CM

## 2016-08-17 DIAGNOSIS — R55 Syncope and collapse: Secondary | ICD-10-CM

## 2016-08-17 HISTORY — DX: Presence of cardiac pacemaker: Z95.0

## 2016-08-17 HISTORY — PX: INSERT / REPLACE / REMOVE PACEMAKER: SUR710

## 2016-08-17 HISTORY — PX: EP IMPLANTABLE DEVICE: SHX172B

## 2016-08-17 LAB — ECHOCARDIOGRAM COMPLETE
AOASC: 30 cm
AOPV: 0.7 m/s
AV Area VTI index: 0.92 cm2/m2
AV Area mean vel: 1.99 cm2
AV VEL mean LVOT/AV: 0.7
AV peak Index: 0.93
AV pk vel: 235 cm/s
AVA: 1.98 cm2
AVAREAMEANVIN: 0.93 cm2/m2
AVAREAVTI: 1.99 cm2
AVG: 10 mmHg
AVPG: 22 mmHg
CHL CUP AV VALUE AREA INDEX: 0.92
CHL CUP AV VEL: 1.98
CHL CUP DOP CALC LVOT VTI: 42.5 cm
CHL CUP MV DEC (S): 216
DOP CAL AO MEAN VELOCITY: 144 cm/s
E decel time: 216 msec
E/e' ratio: 10.48
FS: 43 % (ref 28–44)
HEIGHTINCHES: 65 in
IV/PV OW: 1.02
LA ID, A-P, ES: 42 mm
LADIAMINDEX: 1.96 cm/m2
LAVOL: 69.3 mL
LAVOLA4C: 68.9 mL
LAVOLIN: 32.3 mL/m2
LEFT ATRIUM END SYS DIAM: 42 mm
LV PW d: 12.9 mm — AB (ref 0.6–1.1)
LVEEAVG: 10.48
LVEEMED: 10.48
LVELAT: 12.5 cm/s
LVOT area: 2.84 cm2
LVOT peak grad rest: 11 mmHg
LVOT peak vel: 165 cm/s
LVOTD: 19 mm
LVOTSV: 121 mL
LVOTVTI: 0.7 cm
MV Peak grad: 7 mmHg
MV pk A vel: 64.5 m/s
MV pk E vel: 131 m/s
RV LATERAL S' VELOCITY: 15.2 cm/s
RV TAPSE: 27.5 mm
TDI e' lateral: 12.5
TDI e' medial: 7.99
VTI: 61.1 cm
WEIGHTICAEL: 3406.4 [oz_av]

## 2016-08-17 LAB — TSH: TSH: 2.062 u[IU]/mL (ref 0.350–4.500)

## 2016-08-17 LAB — GLUCOSE, CAPILLARY
GLUCOSE-CAPILLARY: 136 mg/dL — AB (ref 65–99)
GLUCOSE-CAPILLARY: 143 mg/dL — AB (ref 65–99)
GLUCOSE-CAPILLARY: 148 mg/dL — AB (ref 65–99)
Glucose-Capillary: 132 mg/dL — ABNORMAL HIGH (ref 65–99)
Glucose-Capillary: 141 mg/dL — ABNORMAL HIGH (ref 65–99)
Glucose-Capillary: 170 mg/dL — ABNORMAL HIGH (ref 65–99)
Glucose-Capillary: 176 mg/dL — ABNORMAL HIGH (ref 65–99)

## 2016-08-17 LAB — BASIC METABOLIC PANEL
ANION GAP: 9 (ref 5–15)
BUN: 13 mg/dL (ref 6–20)
CALCIUM: 8.8 mg/dL — AB (ref 8.9–10.3)
CO2: 26 mmol/L (ref 22–32)
CREATININE: 0.91 mg/dL (ref 0.44–1.00)
Chloride: 103 mmol/L (ref 101–111)
GFR calc Af Amer: >60 mL/min (ref >60)
GLUCOSE: 166 mg/dL — AB (ref 65–99)
Potassium: 3.3 mmol/L — ABNORMAL LOW (ref 3.5–5.1)
Sodium: 138 mmol/L (ref 135–145)

## 2016-08-17 LAB — CBC
HEMATOCRIT: 38.3 % (ref 36.0–46.0)
Hemoglobin: 12.8 g/dL (ref 12.0–15.0)
MCH: 29.7 pg (ref 26.0–34.0)
MCHC: 33.4 g/dL (ref 30.0–36.0)
MCV: 88.9 fL (ref 78.0–100.0)
PLATELETS: 169 10*3/uL (ref 150–400)
RBC: 4.31 MIL/uL (ref 3.87–5.11)
RDW: 12.8 % (ref 11.5–15.5)
WBC: 7.2 10*3/uL (ref 4.0–10.5)

## 2016-08-17 LAB — PROTIME-INR
INR: 1.06
Prothrombin Time: 13.8 seconds (ref 11.4–15.2)

## 2016-08-17 SURGERY — PACEMAKER IMPLANT

## 2016-08-17 MED ORDER — HEPARIN (PORCINE) IN NACL 2-0.9 UNIT/ML-% IJ SOLN
INTRAMUSCULAR | Status: AC
Start: 2016-08-17 — End: 2016-08-17
  Filled 2016-08-17: qty 500

## 2016-08-17 MED ORDER — CEFAZOLIN IN D5W 1 GM/50ML IV SOLN
1.0000 g | Freq: Four times a day (QID) | INTRAVENOUS | Status: AC
  Administered 2016-08-17 – 2016-08-18 (×3): 1 g via INTRAVENOUS
  Filled 2016-08-17 (×3): qty 50

## 2016-08-17 MED ORDER — SODIUM CHLORIDE 0.9 % IV SOLN: INTRAVENOUS | Status: DC

## 2016-08-17 MED ORDER — SODIUM CHLORIDE 0.9 % IR SOLN
Status: DC | PRN
  Administered 2016-08-17: 13:00:00

## 2016-08-17 MED ORDER — CHLORHEXIDINE GLUCONATE 4 % EX LIQD: 60.0000 mL | Freq: Once | CUTANEOUS | Status: DC

## 2016-08-17 MED ORDER — SODIUM CHLORIDE 0.9 % IR SOLN: 80.0000 mg | Status: DC

## 2016-08-17 MED ORDER — LIDOCAINE HCL (PF) 1 % IJ SOLN
INTRAMUSCULAR | Status: AC
  Filled 2016-08-17: qty 30

## 2016-08-17 MED ORDER — CHLORHEXIDINE GLUCONATE 4 % EX LIQD
60.0000 mL | Freq: Once | CUTANEOUS | Status: AC
  Administered 2016-08-17: 4 via TOPICAL
  Filled 2016-08-17: qty 60

## 2016-08-17 MED ORDER — FENTANYL CITRATE (PF) 100 MCG/2ML IJ SOLN
INTRAMUSCULAR | Status: DC | PRN
  Administered 2016-08-17 (×2): 12.5 ug via INTRAVENOUS
  Administered 2016-08-17: 25 ug via INTRAVENOUS

## 2016-08-17 MED ORDER — ACETAMINOPHEN 325 MG PO TABS: 325.0000 mg | ORAL_TABLET | ORAL | Status: DC | PRN

## 2016-08-17 MED ORDER — ENOXAPARIN SODIUM 40 MG/0.4ML ~~LOC~~ SOLN
40.0000 mg | SUBCUTANEOUS | Status: DC
  Filled 2016-08-17: qty 0.4

## 2016-08-17 MED ORDER — CEFAZOLIN SODIUM-DEXTROSE 2-4 GM/100ML-% IV SOLN
INTRAVENOUS | Status: AC
  Filled 2016-08-17: qty 100

## 2016-08-17 MED ORDER — ATROPINE SULFATE 1 MG/10ML IJ SOSY
PREFILLED_SYRINGE | INTRAMUSCULAR | Status: AC
  Filled 2016-08-17: qty 10

## 2016-08-17 MED ORDER — POTASSIUM CHLORIDE CRYS ER 20 MEQ PO TBCR
30.0000 meq | EXTENDED_RELEASE_TABLET | Freq: Once | ORAL | Status: AC
  Administered 2016-08-17: 30 meq via ORAL
  Filled 2016-08-17: qty 1

## 2016-08-17 MED ORDER — MIDAZOLAM HCL 5 MG/5ML IJ SOLN
INTRAMUSCULAR | Status: DC | PRN
  Administered 2016-08-17: 2 mg via INTRAVENOUS
  Administered 2016-08-17 (×2): 1 mg via INTRAVENOUS

## 2016-08-17 MED ORDER — CEFAZOLIN SODIUM-DEXTROSE 2-4 GM/100ML-% IV SOLN: 2.0000 g | INTRAVENOUS | Status: DC

## 2016-08-17 MED ORDER — MIDAZOLAM HCL 5 MG/5ML IJ SOLN
INTRAMUSCULAR | Status: AC
  Filled 2016-08-17: qty 5

## 2016-08-17 MED ORDER — CEFAZOLIN SODIUM-DEXTROSE 2-3 GM-% IV SOLR
INTRAVENOUS | Status: DC | PRN
  Administered 2016-08-17: 2 g via INTRAVENOUS

## 2016-08-17 MED ORDER — LIDOCAINE HCL (PF) 1 % IJ SOLN
INTRAMUSCULAR | Status: DC | PRN
  Administered 2016-08-17: 60 mL via SUBCUTANEOUS

## 2016-08-17 MED ORDER — SODIUM CHLORIDE 0.9 % IR SOLN
Status: AC
  Filled 2016-08-17: qty 2

## 2016-08-17 MED ORDER — FENTANYL CITRATE (PF) 100 MCG/2ML IJ SOLN
INTRAMUSCULAR | Status: AC
  Filled 2016-08-17: qty 2

## 2016-08-17 MED ORDER — ONDANSETRON HCL 4 MG/2ML IJ SOLN: 4.0000 mg | Freq: Four times a day (QID) | INTRAMUSCULAR | Status: DC | PRN

## 2016-08-17 MED ORDER — OXYCODONE-ACETAMINOPHEN 5-325 MG PO TABS
1.0000 | ORAL_TABLET | Freq: Four times a day (QID) | ORAL | Status: DC | PRN
  Administered 2016-08-17 – 2016-08-18 (×2): 1 via ORAL
  Filled 2016-08-17 (×2): qty 1

## 2016-08-17 SURGICAL SUPPLY — 9 items
CABLE SURGICAL S-101-97-12 (CABLE) ×2 IMPLANT
INGEVITY MRI 7740-45CM (Lead) ×2 IMPLANT
INGEVITY MRI 7741-52CM (Lead) ×2 IMPLANT
LEAD PACING INGEVITY MRI 45CM (Lead) ×1 IMPLANT
LEAD PACING INGEVITY MRI 52CM (Lead) ×1 IMPLANT
PACEMAKER ACCOLADE DR-EL (Pacemaker) ×2 IMPLANT
PAD DEFIB LIFELINK (PAD) ×2 IMPLANT
SHEATH CLASSIC 7F (SHEATH) ×4 IMPLANT
TRAY PACEMAKER INSERTION (PACKS) ×2 IMPLANT

## 2016-08-17 NOTE — Consult Note (Signed)
ELECTROPHYSIOLOGY CONSULT NOTE    Patient ID: [name] [name] MRN: [id number], DOB/AGE: 02/25/1946 70 y.o.  Admit date: 08/16/2016 Date of Consult: 08/17/2016   Primary Physician: Josue HectorNYLAND,LEONARD ROBERT, MD Primary Cardiologist: Dr. Eden EmmsNishan (2011) Requesting MD: Dr. Eden EmmsNishan  Reason for Consultation: [name] [name] is a 70 y.o. female   HPI: [name] [name] is a [age] y.o. female with PMHx of CAD, PCI in 2008, HTN, HLD, DM comes with c/o of worsening exertional capacity, generalized weakness, over the period of a couple months, more so in the last few weeks feeling unusually SOB with minimal activity.  One weak spell a couple weeks ago she had to sit for, no syncope.  No CP or palpitations  LABS: K+ 3.3 BUN/Creat 13/0.91 H/H 12.8/38.2 WBC 7.2 plts 169 TSH 2.062 Trop <0.03  Home meds reviewed, Atenolol 50mg  daily, last dose yesterday morning  Past Medical History:  Diagnosis Date  . High cholesterol   . Hypertension      Surgical History:  Past Surgical History:  Procedure Laterality Date  . ABDOMINAL HYSTERECTOMY    . FOOT SURGERY    . KNEE ARTHROSCOPY    . ROTATOR CUFF REPAIR       Prescriptions Prior to Admission  Medication Sig Dispense Refill Last Dose  . aspirin EC 81 MG tablet Take 81 mg by mouth at bedtime.   08/15/2016 at Unknown time  . atenolol (TENORMIN) 50 MG tablet Take 25 mg by mouth every morning.    08/16/2016 at 900a  . Cholecalciferol (VITAMIN D3) 1000 units CAPS Take 2,000 Units by mouth every morning.    08/16/2016 at Unknown time  . lisinopril-hydrochlorothiazide (PRINZIDE,ZESTORETIC) 10-12.5 MG tablet Take 1 tablet by mouth every morning.    08/16/2016 at Unknown time  . metFORMIN (GLUCOPHAGE-XR) 500 MG 24 hr tablet Take 500 mg by mouth 2 (two) times daily.    08/16/2016 at Unknown time  . omeprazole (PRILOSEC) 20 MG capsule Take 20 mg by mouth every morning.    08/16/2016 at Unknown time  . rosuvastatin (CRESTOR) 40 MG tablet Take 40 mg by mouth at  bedtime.    08/15/2016 at Unknown time    Inpatient Medications:  . aspirin EC  81 mg Oral QHS  . atropine      . [START ON 08/18/2016] enoxaparin (LOVENOX) injection  40 mg Subcutaneous Q24H  . insulin aspart  1-3 Units Subcutaneous Q4H  . pantoprazole  40 mg Oral Daily  . rosuvastatin  40 mg Oral QHS    Allergies: No Known Allergies  Social History   Social History  . Marital status: Divorced    Spouse name: N/A  . Number of children: N/A  . Years of education: N/A   Occupational History  . Not on file.   Social History Main Topics  . Smoking status: Never Smoker  . Smokeless tobacco: Never Used  . Alcohol use No  . Drug use: No  . Sexual activity: Not on file   Other Topics Concern  . Not on file   Social History Narrative  . No narrative on file     Family History  Problem Relation Age of Onset  . Heart disease Mother   . Diabetes Mother   . Heart attack Father   . Cancer - Colon Sister   . Diabetes Sister   . Cancer - Lung Brother   . Cancer - Lung Brother   . Heart disease Son      Review of  Systems: All other systems reviewed and are otherwise negative except as noted above.  Physical Exam: Vitals:   08/16/16 1830 08/16/16 1959 08/16/16 2255 08/17/16 0411  BP: 186/58 (!) 199/59 (!) 150/51 (!) 141/41  Pulse:  (!) 41 (!) 37 (!) 33  Resp: 16 16  18   Temp:  98 F (36.7 C)  98.4 F (36.9 C)  TempSrc:  Oral  Oral  SpO2: 98% 96% 96% 95%  Weight:  212 lb 14.4 oz (96.6 kg)    Height:  5\' 5"  (1.651 m)      GEN- The patient is well appearing, alert and oriented x 3 today.   HEENT: normocephalic, atraumatic; sclera clear, conjunctiva pink; hearing intact; oropharynx clear; neck supple, no JVP Lymph- no cervical lymphadenopathy Lungs- Clear to ausculation bilaterally, normal work of breathing.  No wheezes, rales, rhonchi Heart- Regular rate and rhythm, bradycardic, no murmurs, rubs or gallops, PMI not laterally displaced GI- soft, non-tender,  non-distended Extremities- no clubbing, cyanosis, or edema MS- no significant deformity or atrophy Skin- warm and dry, no rash or lesion Psych- euthymic mood, full affect Neuro- no gross deficits observed  Labs:   Lab Results  Component Value Date   WBC 7.2 08/17/2016   HGB 12.8 08/17/2016   HCT 38.3 08/17/2016   MCV 88.9 08/17/2016   PLT 169 08/17/2016     Recent Labs Lab 08/16/16 1539 08/17/16 0231  NA 138 138  K 3.8 3.3*  CL 103 103  CO2 26 26  BUN 16 13  CREATININE 1.03* 0.91  CALCIUM 9.1 8.8*  PROT 7.4  --   BILITOT 0.8  --   ALKPHOS 66  --   ALT 25  --   AST 21  --   GLUCOSE 139* 166*      Radiology/Studies:  Dg Chest 2 View Result Date: 08/17/2016 CLINICAL DATA:  Bradycardia. EXAM: CHEST  2 VIEW COMPARISON:  Portable AP view at 1529 hour FINDINGS: Heart size and mediastinal contours are unchanged, low lung volumes accentuating the cardiac size. No pulmonary edema, pleural fluid, focal airspace disease or pneumothorax. Unchanged osseous structures. IMPRESSION: Low lung volumes without acute abnormality. Electronically Signed   By: Rubye OaksMelanie  Ehinger M.D.   On: 08/17/2016 02:29     EKG: 2:1 heart block, VR 41, QRS 94ms TELEMETRY: 2:1 heart block, narrow QRS, V rate 30's  Echo is in process at bedside, visually by Dr. Ladona Ridgelaylor, good LVEF 06/27/07: TTE SUMMARY - Overall left ventricular systolic function was normal. Left   ventricular ejection fraction was estimated to be 55 %. There   were no left ventricular regional wall motion abnormalities. - The aortic valve was mildly calcified. There was trivial aortic   valvular regurgitation. IMPRESSIONS - There was no echocardiographic evidence for a cardiac source of   embolism.  Assessment and Plan:   1. 2:1 heart block, V rates 30's     Off atenolol >24hours and persists     No other reversible causes identified     PPM implant, procedure, risks and benfits disussed with the patient by  dr. Ladona Ridgelaylor, she would like to proceed  2. HTN     Hypertensive on arrival     Improved, resume home meds, post pacer  3. CAD     No symptoms  4. DM    Signed, Francis DowseRenee Ursuy, PA-C 08/17/2016 9:59 AM  EP Attending  Patient seen and examined. Agree with above. The patient has symptomatic 2:1 AV block. She has been off of  atenolol for 30 hours and her heart block persists. Will plan to proceed with DDD PM insertion. She has normal LV function. I have discussed the risks/benefits/goals/expectations of PPM insertion with the patient and she wishes to proceed.  Leonia Reeves.D.

## 2016-08-17 NOTE — Progress Notes (Signed)
  Echocardiogram 2D Echocardiogram has been performed.  Reco Shonk L Androw 08/17/2016, 10:20 AM

## 2016-08-17 NOTE — Discharge Instructions (Signed)
° ° °  Supplemental Discharge Instructions for  Pacemaker/Defibrillator Patients  Activity No heavy lifting or vigorous activity with your left/right arm for 6 to 8 weeks.  Do not raise your left/right arm above your head for one week.  Gradually raise your affected arm as drawn below.             08/21/16                     08/22/16                 08/23/16                 08/24/16 __  NO DRIVING for 1 week    ; you may begin driving on 16/06/9611/12/17 .  WOUND CARE - Keep the wound area clean and dry.  Do not get this area wet for one week. No showers for one week; you may shower on 08/24/16 . - The tape/steri-strips on your wound will fall off; do not pull them off.  No bandage is needed on the site.  DO  NOT apply any creams, oils, or ointments to the wound area. - If you notice any drainage or discharge from the wound, any swelling or bruising at the site, or you develop a fever > 101? F after you are discharged home, call the office at once.  Special Instructions - You are still able to use cellular telephones; use the ear opposite the side where you have your pacemaker/defibrillator.  Avoid carrying your cellular phone near your device. - When traveling through airports, show security personnel your identification card to avoid being screened in the metal detectors.  Ask the security personnel to use the hand wand. - Avoid arc welding equipment, MRI testing (magnetic resonance imaging), TENS units (transcutaneous nerve stimulators).  Call the office for questions about other devices. - Avoid electrical appliances that are in poor condition or are not properly grounded. - Microwave ovens are safe to be near or to operate.  Additional information for defibrillator patients should your device go off: - If your device goes off ONCE and you feel fine afterward, notify the device clinic nurses. - If your device goes off ONCE and you do not feel well afterward, call 911. - If your device goes off  TWICE, call 911. - If your device goes off THREE times in one day, call 911.  DO NOT DRIVE YOURSELF OR A FAMILY MEMBER WITH A DEFIBRILLATOR TO THE HOSPITAL--CALL 911.

## 2016-08-17 NOTE — Discharge Summary (Signed)
ELECTROPHYSIOLOGY PROCEDURE DISCHARGE SUMMARY    Patient ID: [name] [name],  MRN: [id number], DOB/AGE: May 20, 1946 70 y.o.  Admit date: 08/16/2016 Discharge date: 08/18/16  Primary Care Physician: Josue HectorNYLAND,LEONARD ROBERT, MD  Primary Cardiologist: Dr. Eden EmmsNishan   Primary Discharge Diagnosis:  1. Symptomatic bradycardia  2. 2:1 AV block  Secondary Discharge Diagnosis:  1. CAD 2. HTN 3. HLD 4. DM  No Known Allergies   Procedures This Admission:  1.  Implantation of a BSCi dual chamber PPM on 08/17/16 by Dr Ladona Ridgelaylor.  The patient received a [employer] (serial number [id number]) pacemaker, with [employer] (serial number [id number]) right atrial lead and a [employer] (serial number [id number]) right ventricular lead.  There were no immediate post procedure complications. 2.  CXR on 08/18/16 demonstrated no pneumothorax status post device implantation.   Brief HPI: [name] [name] is a 70 y.o. female was [facility] with c/o increasing DOE, noted to be in 2:1 heart block and hypertensive, her home atenolol held and admitted for further care and management  Hospital Course:  The patient reported a couple months of decreased exertional capacity and generalized tired feeling, lack of energy this unusual for her, in the last couple weeks markedly worsening until she finally came in.  She reports a week or so before coming in a fleeting episode of weakness and near syncope, though reported no full syncopal events.  She was admitted and observed on telemetry without improvement in her heart block despite no BB.  Her BP controlled, she was mildy hypokalemic and this was replaced.  She was seen by EP service and >24hours off BB therapy, remained in high degree AV block and underwent PPM implant.  Echo noted LVEF 60-65%, no significant VHD described.  She was monitored on telemetry overnight which demonstrated SR, V paced.  Left chest was without hematoma or ecchymosis.  The device was interrogated and  found to be functioning normally.  CXR was obtained and demonstrated no pneumothorax status post device implantation.  Wound care, arm mobility, and restrictions were reviewed with the patient.  The patient was examined by Dr. Ladona Ridgelaylor and considered stable for discharge to home with routine follow up.    Physical Exam: Vitals:   08/17/16 2358 08/18/16 0430 08/18/16 0758 08/18/16 1117  BP: (!) 163/61 (!) 147/67 (!) 176/78 (!) 167/73  Pulse: 76 72 76 70  Resp: 18 18 18 18   Temp:  98.6 F (37 C) 98.4 F (36.9 C)   TempSrc:  Oral Oral   SpO2: 96% 95% 95% 92%  Weight:      Height:        GEN- The patient is well appearing, alert and oriented x 3 today.   HEENT: normocephalic, atraumatic; sclera clear, conjunctiva pink; hearing intact; oropharynx clear; neck supple, no JVP Lungs- Clear to ausculation bilaterally, normal work of breathing.  No wheezes, rales, rhonchi Heart- Regular rate and rhythm, no murmurs, rubs or gallops, PMI not laterally displaced GI- soft, non-tender, non-distended, bowel sounds present, no hepatosplenomegaly Extremities- no clubbing, cyanosis, or edema MS- no significant deformity or atrophy Skin- warm and dry, no rash or lesion, left chest without hematoma/ecchymosis Psych- euthymic mood, full affect Neuro- no gross deficits   Labs:   Lab Results  Component Value Date   WBC 7.2 08/17/2016   HGB 12.8 08/17/2016   HCT 38.3 08/17/2016   MCV 88.9 08/17/2016   PLT 169 08/17/2016     Recent Labs Lab 08/16/16 1539 08/17/16  0231  NA 138 138  K 3.8 3.3*  CL 103 103  CO2 26 26  BUN 16 13  CREATININE 1.03* 0.91  CALCIUM 9.1 8.8*  PROT 7.4  --   BILITOT 0.8  --   ALKPHOS 66  --   ALT 25  --   AST 21  --   GLUCOSE 139* 166*    Discharge Medications:    Medication List    TAKE these medications   aspirin EC 81 MG tablet Take 81 mg by mouth at bedtime.   atenolol 50 MG tablet Commonly known as:  TENORMIN Take 25 mg by mouth every morning.     lisinopril-hydrochlorothiazide 10-12.5 MG tablet Commonly known as:  PRINZIDE,ZESTORETIC Take 1 tablet by mouth every morning.   metFORMIN 500 MG 24 hr tablet Commonly known as:  GLUCOPHAGE-XR Take 500 mg by mouth 2 (two) times daily.   omeprazole 20 MG capsule Commonly known as:  PRILOSEC Take 20 mg by mouth every morning.   rosuvastatin 40 MG tablet Commonly known as:  CRESTOR Take 40 mg by mouth at bedtime.   Vitamin D3 1000 units Caps Take 2,000 Units by mouth every morning.       Disposition:  Home Discharge Instructions    Diet - low sodium heart healthy    Complete by:  As directed    Increase activity slowly    Complete by:  As directed      Follow-up Information    [facility] [address] Follow up on 08/26/2016.   Specialty:  Cardiology Why:  10:30AM, wound check Contact information: [address], [address] [place] [id number] [phone]       Lewayne BuntingGregg Elijah Michaelis, MD Follow up on 11/16/2016.   Specialty:  Cardiology Why:  9:15AM Contact information: [address] [place] [address] [phone]           Duration of Discharge Encounter: Greater than 30 minutes including physician time.  Norma FredricksonSigned, Renee Ursuy, PA-C 08/18/2016 5:35 PM  EP Attending  Patient seen and examined. Agree with above. The patient has had her PPM interogated under my direction and the device is working normally. Will DC home with usual followup.  Leonia ReevesGregg Ludwig Tugwell,M.D.

## 2016-08-18 ENCOUNTER — Inpatient Hospital Stay (HOSPITAL_COMMUNITY): Payer: Medicare Other

## 2016-08-18 ENCOUNTER — Encounter (HOSPITAL_COMMUNITY): Payer: Self-pay | Admitting: General Practice

## 2016-08-18 LAB — GLUCOSE, CAPILLARY
GLUCOSE-CAPILLARY: 170 mg/dL — AB (ref 65–99)
GLUCOSE-CAPILLARY: 145 mg/dL — AB (ref 65–99)
Glucose-Capillary: 195 mg/dL — ABNORMAL HIGH (ref 65–99)

## 2016-08-18 NOTE — Progress Notes (Signed)
Discharge instructions, RX's and follow up appts explained and provided to patient verbalized understanding. Patient left floor via wheelchair accompanied by volunteers no c/o pain or shortness of breath at d/c.  Azia Toutant Lynn, RN  

## 2016-08-19 MED FILL — Heparin Sodium (Porcine) 2 Unit/ML in Sodium Chloride 0.9%: INTRAMUSCULAR | Qty: 500 | Status: AC

## 2016-08-26 ENCOUNTER — Ambulatory Visit (INDEPENDENT_AMBULATORY_CARE_PROVIDER_SITE_OTHER): Payer: Medicare Other | Admitting: *Deleted

## 2016-08-26 DIAGNOSIS — Z950 Presence of cardiac pacemaker: Secondary | ICD-10-CM | POA: Diagnosis not present

## 2016-08-26 DIAGNOSIS — I441 Atrioventricular block, second degree: Secondary | ICD-10-CM | POA: Diagnosis not present

## 2016-08-26 LAB — CUP PACEART INCLINIC DEVICE CHECK
Date Time Interrogation Session: 20171214050000
Implantable Lead Implant Date: 20171205
Implantable Lead Location: 753860
Implantable Lead Model: 7740
Implantable Lead Serial Number: 819256
Lead Channel Impedance Value: 710 Ohm
Lead Channel Pacing Threshold Amplitude: 0.6 V
Lead Channel Pacing Threshold Pulse Width: 0.4 ms
Lead Channel Sensing Intrinsic Amplitude: 6.3 mV
Lead Channel Setting Pacing Pulse Width: 0.4 ms
MDC IDC LEAD IMPLANT DT: 20171205
MDC IDC LEAD LOCATION: 753859
MDC IDC LEAD SERIAL: 673793
MDC IDC MSMT LEADCHNL RA IMPEDANCE VALUE: 634 Ohm
MDC IDC MSMT LEADCHNL RV PACING THRESHOLD AMPLITUDE: 0.6 V
MDC IDC MSMT LEADCHNL RV PACING THRESHOLD PULSEWIDTH: 0.4 ms
MDC IDC MSMT LEADCHNL RV SENSING INTR AMPL: 4.5 mV
MDC IDC PG IMPLANT DT: 20171205
MDC IDC PG SERIAL: 732442
MDC IDC SET LEADCHNL RA PACING AMPLITUDE: 3.5 V
MDC IDC SET LEADCHNL RV PACING AMPLITUDE: 3.5 V
MDC IDC SET LEADCHNL RV SENSING SENSITIVITY: 2.5 mV

## 2016-08-26 NOTE — Progress Notes (Signed)
Wound check appointment. Steri-strips removed. Wound without redness or edema. Incision edges approximated, wound well healed. Normal device function. RA thresholds, sensing, and impedances consistent with implant measurements. RV threshold and sensing consistent, impedance decreased to 710ohms from 1000ohms at implant. Device programmed at 3.5V for extra safety margin until 3 month visit. Histogram distribution appropriate for patient and level of activity. 2% AT/AF burden since d/c from hospital, AF per EGMs, longest ~7hrs, +ASA 81mg . No high ventricular rates noted. Patient educated about wound care, arm mobility, lifting restrictions, and home monitor. ROV with GT/R on 11/18/16.

## 2016-08-30 ENCOUNTER — Telehealth: Payer: Self-pay | Admitting: *Deleted

## 2016-08-30 DIAGNOSIS — I480 Paroxysmal atrial fibrillation: Secondary | ICD-10-CM | POA: Insufficient documentation

## 2016-08-30 MED ORDER — APIXABAN 5 MG PO TABS: 5.0000 mg | ORAL_TABLET | Freq: Two times a day (BID) | ORAL | 11 refills | Status: DC

## 2016-08-30 NOTE — Telephone Encounter (Signed)
Spoke with patient regarding AF detected on PPM check on 12/14.  Reviewed episodes with Dr. Ladona Ridgelaylor, who recommended stopping ASA and starting Eliquis 5mg  BID.  Prescription sent electronically to patient's preferred pharmacy.  Patient verbalizes understanding of instructions to STOP ASA and START Eliquis 5mg  BID.    Four week follow-up appointment scheduled with Dr. Ladona Ridgelaylor on 1/16/18at 2:00pm.  Patient is aware [facility], no open appointments [place].  Reviewed signs/symptoms of bleeding with patient.  Patient verbalizes understanding of instructions to call if she has any questions or concerns.  She is appreciative of call and denies additional questions or concerns at this time.

## 2016-08-31 ENCOUNTER — Telehealth: Payer: Self-pay

## 2016-08-31 NOTE — Telephone Encounter (Addendum)
Prior auth for Eliquis 5mg  submitted to [facility] Approved today. [id number], and is valid through 09/12/2017.

## 2016-09-03 ENCOUNTER — Telehealth: Payer: Self-pay

## 2016-09-03 NOTE — Telephone Encounter (Signed)
Opened in error

## 2016-09-28 ENCOUNTER — Encounter: Payer: Self-pay | Admitting: Internal Medicine

## 2016-09-28 ENCOUNTER — Ambulatory Visit (INDEPENDENT_AMBULATORY_CARE_PROVIDER_SITE_OTHER): Payer: Medicare Other | Admitting: Internal Medicine

## 2016-09-28 VITALS — BP 136/86 | HR 68 | Ht 65.0 in | Wt 213.4 lb

## 2016-09-28 DIAGNOSIS — I480 Paroxysmal atrial fibrillation: Secondary | ICD-10-CM

## 2016-09-28 DIAGNOSIS — Z950 Presence of cardiac pacemaker: Secondary | ICD-10-CM

## 2016-09-28 NOTE — Progress Notes (Signed)
HPI [name] [name] returns today for ongoing evaluation of symptomatic bradycardia, s/p PPM insertion. She has been found to have atrial fib on her PPM interrogation and was started on Eliquis. In the interim, she has done well with no palpitations, chest pain or sob.   No Known Allergies   Current Outpatient Prescriptions  Medication Sig Dispense Refill  . apixaban (ELIQUIS) 5 MG TABS tablet Take 1 tablet (5 mg total) by mouth 2 (two) times daily. 60 tablet 11  . atenolol (TENORMIN) 50 MG tablet Take 25 mg by mouth every morning.     . Cholecalciferol (VITAMIN D3) 1000 units CAPS Take 2,000 Units by mouth every morning.     [name] [name] (STOOL SOFTENER PO) Take 3 capsules by mouth daily.    Marland Kitchen lisinopril-hydrochlorothiazide (PRINZIDE,ZESTORETIC) 10-12.5 MG tablet Take 1 tablet by mouth every morning.     . metFORMIN (GLUCOPHAGE-XR) 500 MG 24 hr tablet Take 500 mg by mouth 2 (two) times daily.     Marland Kitchen omeprazole (PRILOSEC) 20 MG capsule Take 20 mg by mouth every morning.     . rosuvastatin (CRESTOR) 40 MG tablet Take 40 mg by mouth at bedtime.      No current facility-administered medications for this visit.      Past Medical History:  Diagnosis Date  . 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 Coronary artery disease   . Diabetes mellitus without complication (HCC)    type 2  . High cholesterol   . Hypertension   . 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   Past Surgical History:  Procedure Laterality Date  . ABDOMINAL HYSTERECTOMY    . CORONARY ANGIOPLASTY    . EP IMPLANTABLE DEVICE N/A 08/17/2016   Procedure: Pacemaker Implant;  Surgeon: Marinus Maw, MD;  Location: [facility] INVASIVE CV LAB;  Service: Cardiovascular;  Laterality: N/A;  . FOOT SURGERY    . INSERT / REPLACE / REMOVE PACEMAKER  08/17/2016  . KNEE ARTHROSCOPY    . ROTATOR CUFF REPAIR       Family History  Problem Relation  Age of Onset  . Heart disease Mother   . Diabetes Mother   . Heart attack Father   . Cancer - Colon Sister   . Diabetes Sister   . Cancer - Lung Brother   . Cancer - Lung Brother   . Heart disease Son      Social History   Social History  . Marital status: Divorced    Spouse name: N/A  . Number of children: N/A  . Years of education: N/A   Occupational History  . Not on file.   Social History Main Topics  . Smoking status: Never Smoker  . Smokeless tobacco: Never Used  . Alcohol use No  . Drug use: No  . Sexual activity: Not on file   Other Topics Concern  . Not on file   Social History Narrative  . No narrative on file     BP 136/86   Pulse 68   Ht 5\' 5"  (1.651 m)   Wt 213 lb 6.4 oz (96.8 kg)   BMI 35.51 kg/m   Physical Exam:  Well appearing NAD HEENT: Unremarkable Neck:  No JVD, no thyromegally Lymphatics:  No adenopathy Back:  No CVA tenderness Lungs:  Clear with no wheezes HEART:  Regular rate rhythm, no murmurs, no rubs, no  clicks Abd:  soft, positive bowel sounds, no organomegally, no rebound, no guarding Ext:  2 plus pulses, no edema, no cyanosis, no clubbing Skin:  No rashes no nodules Neuro:  CN II through XII intact, motor grossly intact  ECG - nsr with ventricular pacing  DEVICE  Normal device function.  See PaceArt for details.   Assess/Plan: 1. PAF - the patient is maintaining NSR. She will undergo watchful waiting. No indication for AA drug therapy at this point. Continue eliuquis. 2. Complete heart block - she is paced at 30/min. 3. PPM - her [employer] DDD PM is working normally. Will follow. 4. HTN - her blood pressure is a bit high. She is encouraged to lose weight and maintain a low sodium diet.  Leonia ReevesGregg Taylor,M.D.

## 2016-09-28 NOTE — Patient Instructions (Addendum)
Medication Instructions:    Your physician recommends that you continue on your current medications as directed. Please refer to the Current Medication list given to you today.  --- If you need a refill on your cardiac medications before your next appointment, please call your pharmacy. ---  Labwork:  None ordered  Testing/Procedures:  None ordered  Follow-Up: Remote monitoring is used to monitor your Pacemaker of ICD from home. This monitoring reduces the number of office visits required to check your device to one time per year. It allows us to keep an eye on the functioning of your device to ensure it is working properly. You are scheduled for a device check from home on 12/28/2016. You may send your transmission at any time that day. If you have a wireless device, the transmission will be sent automatically. After your physician reviews your transmission, you will receive a postcard with your next transmission date.   Your physician wants you to follow-up in: December 2018 with Dr. Ladona Ridgelaylor.  You will receive a reminder letter in the mail two months in advance. If you don't receive a letter, please call our office to schedule the follow-up appointment.  Thank you for choosing [facility]!!

## 2016-10-25 LAB — CUP PACEART INCLINIC DEVICE CHECK
Brady Statistic RA Percent Paced: 21 %
Implantable Lead Implant Date: 20171205
Implantable Lead Location: 753859
Implantable Lead Model: 7741
Implantable Lead Serial Number: 819256
Implantable Pulse Generator Implant Date: 20171205
Lead Channel Impedance Value: 640 Ohm
Lead Channel Impedance Value: 754 Ohm
Lead Channel Setting Pacing Amplitude: 3.5 V
Lead Channel Setting Pacing Amplitude: 3.5 V
Lead Channel Setting Pacing Pulse Width: 0.4 ms
MDC IDC LEAD IMPLANT DT: 20171205
MDC IDC LEAD LOCATION: 753860
MDC IDC LEAD SERIAL: 673793
MDC IDC MSMT BATTERY REMAINING LONGEVITY: 180 mo
MDC IDC MSMT LEADCHNL RV IMPEDANCE VALUE: 1146 Ohm
MDC IDC SESS DTM: 20180212141801
MDC IDC SET LEADCHNL RV SENSING SENSITIVITY: 2.5 mV
MDC IDC STAT BRADY RV PERCENT PACED: 100 %
Pulse Gen Serial Number: 732442

## 2016-11-16 ENCOUNTER — Encounter: Payer: Medicare Other | Admitting: Internal Medicine

## 2016-11-18 ENCOUNTER — Encounter: Payer: Medicare Other | Admitting: Internal Medicine

## 2016-12-28 ENCOUNTER — Ambulatory Visit (INDEPENDENT_AMBULATORY_CARE_PROVIDER_SITE_OTHER): Payer: Medicare Other | Admitting: *Deleted

## 2016-12-28 ENCOUNTER — Telehealth: Payer: Self-pay | Admitting: Cardiology

## 2016-12-28 DIAGNOSIS — I441 Atrioventricular block, second degree: Secondary | ICD-10-CM

## 2016-12-28 LAB — CUP PACEART REMOTE DEVICE CHECK
Brady Statistic RA Percent Paced: 27 %
Brady Statistic RV Percent Paced: 100 %
Date Time Interrogation Session: 20180417183300
Implantable Lead Implant Date: 20171205
Implantable Lead Location: 753860
Implantable Lead Model: 7740
Implantable Lead Model: 7741
Implantable Lead Serial Number: 673793
Lead Channel Impedance Value: 1187 Ohm
Lead Channel Pacing Threshold Amplitude: 0.6 V
Lead Channel Pacing Threshold Pulse Width: 0.4 ms
Lead Channel Setting Sensing Sensitivity: 2.5 mV
MDC IDC LEAD IMPLANT DT: 20171205
MDC IDC LEAD LOCATION: 753859
MDC IDC LEAD SERIAL: 819256
MDC IDC MSMT BATTERY REMAINING LONGEVITY: 168 mo
MDC IDC MSMT BATTERY REMAINING PERCENTAGE: 100 %
MDC IDC MSMT LEADCHNL RA IMPEDANCE VALUE: 707 Ohm
MDC IDC MSMT LEADCHNL RA PACING THRESHOLD AMPLITUDE: 0.5 V
MDC IDC MSMT LEADCHNL RA PACING THRESHOLD PULSEWIDTH: 0.4 ms
MDC IDC PG IMPLANT DT: 20171205
MDC IDC SET LEADCHNL RA PACING AMPLITUDE: 2 V
MDC IDC SET LEADCHNL RV PACING AMPLITUDE: 2.5 V
MDC IDC SET LEADCHNL RV PACING PULSEWIDTH: 0.4 ms
Pulse Gen Serial Number: 732442

## 2016-12-28 NOTE — Telephone Encounter (Signed)
LMOVM reminding pt to send remote transmission.   

## 2016-12-28 NOTE — Progress Notes (Signed)
Remote pacemaker transmission.   

## 2016-12-31 ENCOUNTER — Encounter: Payer: Self-pay | Admitting: Cardiology

## 2017-03-29 ENCOUNTER — Ambulatory Visit (INDEPENDENT_AMBULATORY_CARE_PROVIDER_SITE_OTHER): Payer: Medicare Other | Admitting: *Deleted

## 2017-03-29 DIAGNOSIS — I441 Atrioventricular block, second degree: Secondary | ICD-10-CM

## 2017-03-29 NOTE — Progress Notes (Signed)
Remote pacemaker transmission.   

## 2017-03-30 ENCOUNTER — Encounter: Payer: Self-pay | Admitting: Cardiology

## 2017-03-30 LAB — CUP PACEART REMOTE DEVICE CHECK
Battery Remaining Longevity: 162 mo
Battery Remaining Percentage: 100 %
Brady Statistic RA Percent Paced: 28 %
Brady Statistic RV Percent Paced: 100 %
Implantable Lead Implant Date: 20171205
Implantable Lead Model: 7740
Implantable Lead Model: 7741
Lead Channel Impedance Value: 713 Ohm
Lead Channel Impedance Value: 906 Ohm
Lead Channel Setting Pacing Amplitude: 2 V
Lead Channel Setting Pacing Amplitude: 2.5 V
MDC IDC LEAD IMPLANT DT: 20171205
MDC IDC LEAD LOCATION: 753859
MDC IDC LEAD LOCATION: 753860
MDC IDC LEAD SERIAL: 673793
MDC IDC LEAD SERIAL: 819256
MDC IDC MSMT LEADCHNL RA PACING THRESHOLD AMPLITUDE: 0.6 V
MDC IDC MSMT LEADCHNL RA PACING THRESHOLD PULSEWIDTH: 0.4 ms
MDC IDC MSMT LEADCHNL RV PACING THRESHOLD AMPLITUDE: 0.8 V
MDC IDC MSMT LEADCHNL RV PACING THRESHOLD PULSEWIDTH: 0.4 ms
MDC IDC PG IMPLANT DT: 20171205
MDC IDC PG SERIAL: 732442
MDC IDC SESS DTM: 20180717121100
MDC IDC SET LEADCHNL RV PACING PULSEWIDTH: 0.4 ms
MDC IDC SET LEADCHNL RV SENSING SENSITIVITY: 2.5 mV

## 2017-06-06 ENCOUNTER — Telehealth: Payer: Self-pay

## 2017-06-06 NOTE — Telephone Encounter (Signed)
Pt states that she thinks she is having a allergic reaction to her Eliquis. She says that her head will not stop itching, she states that she has changed her hair products but the itching continues and she is convinced the itching is coming from the medication. She would like for someone to give her a call so she can know wether or not she needs to stop using it.

## 2017-06-07 ENCOUNTER — Telehealth: Payer: Self-pay | Admitting: Internal Medicine

## 2017-06-07 NOTE — Telephone Encounter (Signed)
NEw Message  Pt c/o medication issue:  1. Name of Medication: Eliquis  2. How are you currently taking this medication (dosage and times per day)? *    3. Are you having a reaction (difficulty breathing--STAT)?   4. What is your medication issue? Per pt would like to discuss an allergic reaction to mediation. Please call back to discuss

## 2017-06-10 NOTE — Telephone Encounter (Signed)
Left message for Pt.  Pt c/o itching, believes it is Eliquis.  Spoke with APP, APP unaware of itching being side effect.  Advised Pt could switch to something else if she wanted to.  Left message notifying Pt that itching not often associated with Eliquis, but if she wants to try something else return my call and will discuss with Dr. Ladona Ridgel.   Left this nurse name and # for call back.

## 2017-06-16 MED ORDER — RIVAROXABAN 20 MG PO TABS: 20.0000 mg | ORAL_TABLET | Freq: Every day | ORAL | 11 refills | Status: DC

## 2017-06-16 NOTE — Telephone Encounter (Signed)
Per Dr. Junie Spencer for Pt to change to Xarelto 20 mg daily with meals.   Spoke with coumadin clinic, Xarelto 20 mg daily is correct dose based on CrCl.  Pt last labs 08/2016.  Requested Pt have CBC/BMET drawn within month after starting Xarelto. Call placed to Pt.  Pt notified that a new prescription would be sent to her pharmacy.  Notified Pt should would be taking Xarelto 20 mg by mouth daily with a meal.  Notified Pt that office will set up her yearly appt with Dr. Ladona Ridgel in November so that she can get her labs same day as her normal f/u.  Pt indicates understanding.  Notified to call office if she had any addl concerns.

## 2017-06-28 ENCOUNTER — Ambulatory Visit (INDEPENDENT_AMBULATORY_CARE_PROVIDER_SITE_OTHER): Payer: Medicare Other | Admitting: *Deleted

## 2017-06-28 DIAGNOSIS — I441 Atrioventricular block, second degree: Secondary | ICD-10-CM | POA: Diagnosis not present

## 2017-06-30 NOTE — Progress Notes (Signed)
Remote pacemaker transmission.   

## 2017-07-01 ENCOUNTER — Encounter: Payer: Self-pay | Admitting: Cardiology

## 2017-07-01 NOTE — Progress Notes (Signed)
Letter  

## 2017-07-21 LAB — CUP PACEART REMOTE DEVICE CHECK
Battery Remaining Longevity: 162 mo
Battery Remaining Percentage: 100 %
Brady Statistic RA Percent Paced: 27 %
Date Time Interrogation Session: 20181016060100
Implantable Lead Implant Date: 20171205
Implantable Lead Location: 753859
Implantable Lead Serial Number: 819256
Implantable Pulse Generator Implant Date: 20171205
Lead Channel Pacing Threshold Amplitude: 0.5 V
Lead Channel Pacing Threshold Pulse Width: 0.4 ms
Lead Channel Setting Pacing Amplitude: 2 V
Lead Channel Setting Pacing Amplitude: 2.5 V
Lead Channel Setting Pacing Pulse Width: 0.4 ms
MDC IDC LEAD IMPLANT DT: 20171205
MDC IDC LEAD LOCATION: 753860
MDC IDC LEAD SERIAL: 673793
MDC IDC MSMT LEADCHNL RA IMPEDANCE VALUE: 711 Ohm
MDC IDC MSMT LEADCHNL RA PACING THRESHOLD PULSEWIDTH: 0.4 ms
MDC IDC MSMT LEADCHNL RV IMPEDANCE VALUE: 836 Ohm
MDC IDC MSMT LEADCHNL RV PACING THRESHOLD AMPLITUDE: 0.8 V
MDC IDC PG SERIAL: 732442
MDC IDC SET LEADCHNL RV SENSING SENSITIVITY: 2.5 mV
MDC IDC STAT BRADY RV PERCENT PACED: 100 %

## 2017-07-27 ENCOUNTER — Encounter: Payer: Self-pay | Admitting: Internal Medicine

## 2017-07-27 ENCOUNTER — Ambulatory Visit: Payer: Medicare Other | Admitting: Internal Medicine

## 2017-07-27 VITALS — BP 124/72 | HR 62 | Ht 65.0 in | Wt 209.0 lb

## 2017-07-27 DIAGNOSIS — Z950 Presence of cardiac pacemaker: Secondary | ICD-10-CM

## 2017-07-27 DIAGNOSIS — I480 Paroxysmal atrial fibrillation: Secondary | ICD-10-CM

## 2017-07-27 DIAGNOSIS — I441 Atrioventricular block, second degree: Secondary | ICD-10-CM

## 2017-07-27 NOTE — Patient Instructions (Addendum)

## 2017-07-27 NOTE — Progress Notes (Signed)
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 She also has a history of paroxysmal atrial fibrillation. She denies chest pain or shortness of breath. She admits to some dietary indiscretion and has been unable to lose weight. No syncope. Allergies  Allergen Reactions  . Propoxyphene Rash     Current Outpatient Medications  Medication Sig Dispense Refill  . atenolol (TENORMIN) 50 MG tablet Take 25 mg by mouth every morning.     . Cholecalciferol (VITAMIN D3) 1000 units CAPS Take 2,000 Units by mouth every morning.     [name] [name]. Docusate Calcium (STOOL SOFTENER PO) Take 3 capsules by mouth daily.    Marland Kitchen. lisinopril-hydrochlorothiazide (PRINZIDE,ZESTORETIC) 10-12.5 MG tablet Take 1 tablet by mouth every morning.     . metFORMIN (GLUCOPHAGE-XR) 500 MG 24 hr tablet Take 500 mg by mouth 2 (two) times daily.     . nitroGLYCERIN (NITROSTAT) 0.4 MG SL tablet Place 0.4 mg as needed under the tongue for chest pain.    Marland Kitchen. omeprazole (PRILOSEC) 20 MG capsule Take 20 mg by mouth every morning.     . rivaroxaban (XARELTO) 20 MG TABS tablet Take 1 tablet (20 mg total) by mouth daily with supper. 30 tablet 11  . rosuvastatin (CRESTOR) 40 MG tablet Take 40 mg by mouth at bedtime.      No current facility-administered medications for this visit.      Past Medical History:  Diagnosis Date  . 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 Coronary artery disease   . Diabetes mellitus without complication (HCC)    type 2  . High cholesterol   . Hypertension   . 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   Past Surgical History:  Procedure Laterality Date  . ABDOMINAL HYSTERECTOMY    . CORONARY ANGIOPLASTY    . FOOT SURGERY    . INSERT / REPLACE / REMOVE PACEMAKER  08/17/2016  . KNEE ARTHROSCOPY    . ROTATOR  CUFF REPAIR       Family History  Problem Relation Age of Onset  . Heart disease Mother   . Diabetes Mother   . Heart attack Father   . Cancer - Colon Sister   . Diabetes Sister   . Cancer - Lung Brother   . Cancer - Lung Brother   . Heart disease Son      Social History   Socioeconomic History  . Marital status: Divorced    Spouse name: Not on file  . Number of children: Not on file  . Years of education: Not on file  . Highest education level: Not on file  Social Needs  . Financial resource strain: Not on file  . Food insecurity - worry: Not on file  . Food insecurity - inability: Not on file  . Transportation needs - medical: Not on file  . Transportation needs - non-medical: Not on file  Occupational History  . Not on file  Tobacco Use  . Smoking status: Never Smoker  . Smokeless tobacco: Never Used  Substance and Sexual Activity  . Alcohol use: No  . Drug use: No  . Sexual activity: Not on file  Other Topics Concern  . Not on file  Social History Narrative  . Not on file     BP 124/72   Pulse  62   Ht 5\' 5"  (1.651 m)   Wt 209 lb (94.8 kg)   SpO2 95%   BMI 34.78 kg/m   Physical Exam:  Well appearing overweight 71 year old woman, NAD HEENT: Unremarkable Neck:  7 cm JVD, no thyromegally Lymphatics:  No adenopathy Back:  No CVA tenderness Lungs:  Clear, with no wheezes, rales, or rhonchi HEART:  Regular rate rhythm, no murmurs, no rubs, no clicks Abd:  soft, positive bowel sounds, no organomegally, no rebound, no guarding Ext:  2 plus pulses, no edema, no cyanosis, no clubbing Skin:  No rashes no nodules Neuro:  CN II through XII intact, motor grossly intact   DEVICE  Normal device function.  See PaceArt for details.   Assess/Plan: 1. High-grade heart block - the patient is conducting today we have reprogrammed her device to allow for intrinsic conduction. 2. Pacemaker - her [employer] dual-chamber pacemaker is working normally. We'll  plan to recheck in several months. 3. Hypertension - her blood pressure is well controlled today. She is encouraged to maintain a low-sodium diet and lose weight. No change in medications. 4. Obesity - I encouraged the patient to get down under 200 pounds by the time I see her in a year. Hopefully additional reductions will take place after this. She is encouraged to exercise daily.  Lewayne BuntingGregg Taylor, M.D.

## 2017-07-29 LAB — CUP PACEART INCLINIC DEVICE CHECK
Implantable Lead Implant Date: 20171205
Implantable Lead Implant Date: 20171205
Implantable Lead Location: 753860
Implantable Lead Model: 7741
Lead Channel Pacing Threshold Pulse Width: 0.4 ms
Lead Channel Sensing Intrinsic Amplitude: 8.1 mV
MDC IDC LEAD LOCATION: 753859
MDC IDC LEAD SERIAL: 673793
MDC IDC LEAD SERIAL: 819256
MDC IDC MSMT LEADCHNL RA IMPEDANCE VALUE: 730 Ohm
MDC IDC MSMT LEADCHNL RA PACING THRESHOLD AMPLITUDE: 0.7 V
MDC IDC MSMT LEADCHNL RA PACING THRESHOLD PULSEWIDTH: 0.4 ms
MDC IDC MSMT LEADCHNL RA SENSING INTR AMPL: 6.7 mV
MDC IDC MSMT LEADCHNL RV IMPEDANCE VALUE: 849 Ohm
MDC IDC MSMT LEADCHNL RV PACING THRESHOLD AMPLITUDE: 0.6 V
MDC IDC PG IMPLANT DT: 20171205
MDC IDC PG SERIAL: 732442
MDC IDC SESS DTM: 20181114050000
MDC IDC SET LEADCHNL RA PACING AMPLITUDE: 2 V
MDC IDC SET LEADCHNL RV PACING AMPLITUDE: 2.5 V
MDC IDC SET LEADCHNL RV PACING PULSEWIDTH: 0.4 ms
MDC IDC SET LEADCHNL RV SENSING SENSITIVITY: 2.5 mV

## 2017-09-13 HISTORY — PX: OTHER SURGICAL HISTORY: SHX169

## 2017-09-22 ENCOUNTER — Encounter: Payer: Medicare Other | Admitting: Internal Medicine

## 2017-09-27 ENCOUNTER — Ambulatory Visit (INDEPENDENT_AMBULATORY_CARE_PROVIDER_SITE_OTHER): Payer: Medicare Other | Admitting: *Deleted

## 2017-09-27 DIAGNOSIS — I441 Atrioventricular block, second degree: Secondary | ICD-10-CM

## 2017-09-29 NOTE — Progress Notes (Signed)
Remote pacemaker transmission.   

## 2017-09-30 ENCOUNTER — Encounter: Payer: Self-pay | Admitting: Cardiology

## 2017-10-03 LAB — CUP PACEART REMOTE DEVICE CHECK
Battery Remaining Longevity: 156 mo
Brady Statistic RA Percent Paced: 31 %
Brady Statistic RV Percent Paced: 49 %
Implantable Lead Implant Date: 20171205
Implantable Lead Location: 753859
Implantable Lead Location: 753860
Implantable Lead Model: 7740
Implantable Lead Model: 7741
Implantable Lead Serial Number: 819256
Lead Channel Impedance Value: 670 Ohm
Lead Channel Pacing Threshold Amplitude: 0.6 V
Lead Channel Setting Pacing Amplitude: 2 V
Lead Channel Setting Pacing Amplitude: 2.5 V
MDC IDC LEAD IMPLANT DT: 20171205
MDC IDC LEAD SERIAL: 673793
MDC IDC MSMT BATTERY REMAINING PERCENTAGE: 100 %
MDC IDC MSMT LEADCHNL RA PACING THRESHOLD PULSEWIDTH: 0.4 ms
MDC IDC MSMT LEADCHNL RV IMPEDANCE VALUE: 753 Ohm
MDC IDC MSMT LEADCHNL RV PACING THRESHOLD AMPLITUDE: 1 V
MDC IDC MSMT LEADCHNL RV PACING THRESHOLD PULSEWIDTH: 0.4 ms
MDC IDC PG IMPLANT DT: 20171205
MDC IDC SESS DTM: 20190115070100
MDC IDC SET LEADCHNL RV PACING PULSEWIDTH: 0.4 ms
MDC IDC SET LEADCHNL RV SENSING SENSITIVITY: 2.5 mV
Pulse Gen Serial Number: 732442

## 2017-11-03 ENCOUNTER — Telehealth: Payer: Self-pay

## 2017-11-03 NOTE — Telephone Encounter (Signed)
   Primary Cardiologist: Lewayne BuntingGregg Taylor, MD  Chart reviewed as part of pre-operative protocol coverage. Patient was contacted 11/03/2017 in reference to pre-operative risk assessment for pending surgery as outlined below.  Orbie PyoLinda D Lausch was last seen on 07/27/2017 by Dr Ladona Ridgelaylor.  Since that day, Orbie PyoLinda D Hammers has done well. She line-dances and does all her own yard work.  She gets a little short of breath when she does things but this has not changed recently and she feels that her activity level is good.  Therefore, based on ACC/AHA guidelines, the patient would be at acceptable risk for the planned procedure without further cardiovascular testing.   She has never had a stroke or other cerebrovascular event. Her CHA2DS2VASc=5 (age x 1, female, CAD, HTN, DM).  Okay to hold the Xarelto for 2 days prior to the planned procedure and resume as soon after it as the performing MD will allow.  I will route this recommendation to the requesting party via Epic fax function and remove from pre-op pool.  Please call with questions.  Theodore Demarkhonda Miela Desjardin, PA-C 11/03/2017, 4:39 PM

## 2017-11-03 NOTE — Telephone Encounter (Signed)
   [facility] Pre-operative Risk Assessment    Request for surgical clearance:  1. What type of surgery is being performed? colonoscopy   2. When is this surgery scheduled? 11/17/17   3. What type of clearance is required (medical clearance vs. Pharmacy clearance to hold med vs. Both)? both  4. Are there any medications that need to be held prior to surgery and how long?xarelto cease  5. Practice name and name of physician performing surgery?  [facility] Dr. Alessandra Bevels  6. What is your office phone and fax number? [phone]   7. Anesthesia type (None, local, MAC, general) ? MAC   [name] [name] 11/03/2017, 3:18 PM  _________________________________________________________________   (provider comments below)

## 2017-12-27 ENCOUNTER — Ambulatory Visit (INDEPENDENT_AMBULATORY_CARE_PROVIDER_SITE_OTHER): Payer: Medicare Other | Admitting: *Deleted

## 2017-12-27 DIAGNOSIS — I441 Atrioventricular block, second degree: Secondary | ICD-10-CM | POA: Diagnosis not present

## 2017-12-27 NOTE — Progress Notes (Signed)
Remote pacemaker transmission.   

## 2017-12-29 ENCOUNTER — Encounter: Payer: Self-pay | Admitting: Cardiology

## 2018-01-09 LAB — CUP PACEART REMOTE DEVICE CHECK
Battery Remaining Percentage: 100 %
Brady Statistic RV Percent Paced: 44 %
Date Time Interrogation Session: 20190416060100
Implantable Lead Implant Date: 20171205
Implantable Lead Location: 753860
Implantable Lead Model: 7740
Implantable Lead Model: 7741
Implantable Lead Serial Number: 673793
Lead Channel Impedance Value: 676 Ohm
Lead Channel Impedance Value: 695 Ohm
Lead Channel Pacing Threshold Amplitude: 0.8 V
Lead Channel Setting Sensing Sensitivity: 2.5 mV
MDC IDC LEAD IMPLANT DT: 20171205
MDC IDC LEAD LOCATION: 753859
MDC IDC LEAD SERIAL: 819256
MDC IDC MSMT BATTERY REMAINING LONGEVITY: 162 mo
MDC IDC MSMT LEADCHNL RA PACING THRESHOLD AMPLITUDE: 0.6 V
MDC IDC MSMT LEADCHNL RA PACING THRESHOLD PULSEWIDTH: 0.4 ms
MDC IDC MSMT LEADCHNL RV PACING THRESHOLD PULSEWIDTH: 0.4 ms
MDC IDC PG IMPLANT DT: 20171205
MDC IDC PG SERIAL: 732442
MDC IDC SET LEADCHNL RA PACING AMPLITUDE: 2 V
MDC IDC SET LEADCHNL RV PACING AMPLITUDE: 2.5 V
MDC IDC SET LEADCHNL RV PACING PULSEWIDTH: 0.4 ms
MDC IDC STAT BRADY RA PERCENT PACED: 34 %

## 2018-02-13 ENCOUNTER — Emergency Department (HOSPITAL_COMMUNITY)
Admission: EM | Admit: 2018-02-13 | Discharge: 2018-02-13 | Disposition: A | Discharge status: Home / Self Care | Payer: Medicare Other | Attending: Emergency Medicine | Admitting: Emergency Medicine

## 2018-02-13 ENCOUNTER — Emergency Department (HOSPITAL_COMMUNITY): Payer: Medicare Other

## 2018-02-13 ENCOUNTER — Encounter (HOSPITAL_COMMUNITY): Payer: Self-pay | Admitting: Emergency Medicine

## 2018-02-13 DIAGNOSIS — I2510 Atherosclerotic heart disease of native coronary artery without angina pectoris: Secondary | ICD-10-CM | POA: Diagnosis not present

## 2018-02-13 DIAGNOSIS — E119 Type 2 diabetes mellitus without complications: Secondary | ICD-10-CM | POA: Insufficient documentation

## 2018-02-13 DIAGNOSIS — R110 Nausea: Secondary | ICD-10-CM | POA: Insufficient documentation

## 2018-02-13 DIAGNOSIS — Z79899 Other long term (current) drug therapy: Secondary | ICD-10-CM | POA: Diagnosis not present

## 2018-02-13 DIAGNOSIS — Z7984 Long term (current) use of oral hypoglycemic drugs: Secondary | ICD-10-CM | POA: Diagnosis not present

## 2018-02-13 DIAGNOSIS — R1031 Right lower quadrant pain: Secondary | ICD-10-CM | POA: Insufficient documentation

## 2018-02-13 DIAGNOSIS — I10 Essential (primary) hypertension: Secondary | ICD-10-CM | POA: Insufficient documentation

## 2018-02-13 DIAGNOSIS — Z7901 Long term (current) use of anticoagulants: Secondary | ICD-10-CM | POA: Insufficient documentation

## 2018-02-13 DIAGNOSIS — Z950 Presence of cardiac pacemaker: Secondary | ICD-10-CM | POA: Insufficient documentation

## 2018-02-13 LAB — COMPREHENSIVE METABOLIC PANEL
ALBUMIN: 4.1 g/dL (ref 3.5–5.0)
ALT: 13 U/L — ABNORMAL LOW (ref 14–54)
ANION GAP: 9 (ref 5–15)
AST: 15 U/L (ref 15–41)
Alkaline Phosphatase: 62 U/L (ref 38–126)
BILIRUBIN TOTAL: 0.7 mg/dL (ref 0.3–1.2)
BUN: 13 mg/dL (ref 6–20)
CHLORIDE: 102 mmol/L (ref 101–111)
CO2: 30 mmol/L (ref 22–32)
Calcium: 9.6 mg/dL (ref 8.9–10.3)
Creatinine, Ser: 0.78 mg/dL (ref 0.44–1.00)
GFR calc Af Amer: >60 mL/min (ref >60)
GFR calc non Af Amer: >60 mL/min (ref >60)
GLUCOSE: 110 mg/dL — AB (ref 65–99)
POTASSIUM: 4.8 mmol/L (ref 3.5–5.1)
SODIUM: 141 mmol/L (ref 135–145)
Total Protein: 7.6 g/dL (ref 6.5–8.1)

## 2018-02-13 LAB — CBC
HEMATOCRIT: 46.7 % — AB (ref 36.0–46.0)
HEMOGLOBIN: 14.9 g/dL (ref 12.0–15.0)
MCH: 28.1 pg (ref 26.0–34.0)
MCHC: 31.9 g/dL (ref 30.0–36.0)
MCV: 88.1 fL (ref 78.0–100.0)
Platelets: 264 10*3/uL (ref 150–400)
RBC: 5.3 MIL/uL — ABNORMAL HIGH (ref 3.87–5.11)
RDW: 13 % (ref 11.5–15.5)
WBC: 8.1 10*3/uL (ref 4.0–10.5)

## 2018-02-13 LAB — URINALYSIS, ROUTINE W REFLEX MICROSCOPIC
BILIRUBIN URINE: NEGATIVE
Glucose, UA: NEGATIVE mg/dL
Hgb urine dipstick: NEGATIVE
KETONES UR: NEGATIVE mg/dL
Leukocytes, UA: NEGATIVE
NITRITE: NEGATIVE
PH: 5 (ref 5.0–8.0)
Protein, ur: NEGATIVE mg/dL
SPECIFIC GRAVITY, URINE: 1.01 (ref 1.005–1.030)

## 2018-02-13 LAB — LIPASE, BLOOD: LIPASE: 29 U/L (ref 11–51)

## 2018-02-13 MED ORDER — CIPROFLOXACIN HCL 500 MG PO TABS: 500.0000 mg | ORAL_TABLET | Freq: Two times a day (BID) | ORAL | 0 refills | Status: DC

## 2018-02-13 MED ORDER — IOPAMIDOL (ISOVUE-300) INJECTION 61%
100.0000 mL | Freq: Once | INTRAVENOUS | Status: AC | PRN
  Administered 2018-02-13: 100 mL via INTRAVENOUS

## 2018-02-13 MED ORDER — METRONIDAZOLE 500 MG PO TABS: 500.0000 mg | ORAL_TABLET | Freq: Two times a day (BID) | ORAL | 0 refills | Status: DC

## 2018-02-13 MED ORDER — TRAMADOL HCL 50 MG PO TABS: 50.0000 mg | ORAL_TABLET | Freq: Four times a day (QID) | ORAL | 0 refills | Status: DC | PRN

## 2018-02-13 NOTE — ED Triage Notes (Signed)
Pt reports having a colonoscopy on 5/29 with multiple polyps removed.  Began having right lower quad pain on Thursday.  Was told to come to ED for eval by pcp.  Denies rectal bleeding.

## 2018-02-13 NOTE — ED Provider Notes (Signed)
[facility] EMERGENCY DEPARTMENT Provider Note   CSN: [id number] Arrival date & time: 02/13/18  1149     History   Chief Complaint Chief Complaint  Patient presents with  . Abdominal Pain    HPI [name] [name] is a 72 y.o. female.  HPI Patient had colonoscopy performed 5 days ago and had multiple polyps removed.  Following day patient developed right-sided lower quadrant abdominal pain worse with movement.  She has a episodes of nausea but denies any vomiting.  Has had normal bowel movements with blood in the stool.  States she has had a normal appetite.  No urinary symptoms. Past Medical History:  Diagnosis Date  . 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 Coronary artery disease   . Diabetes mellitus without complication (HCC)    type 2  . High cholesterol   . Hypertension   . 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 Paroxysmal atrial fibrillation (HCC) 08/30/2016  . AV block, 2nd degree 08/16/2016  . CAD 04/18/2009  . HYPERCHOLESTEROLEMIA 03/01/2009  . HYPERTENSION 03/01/2009  . GERD 03/01/2009  . UTI 03/01/2009  . 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 EDEMA 03/01/2009  . MURMUR 03/01/2009  . SHORTNESS OF BREATH 03/01/2009  . IRRITABLE BOWEL SYNDROME, HX OF 03/01/2009    Past Surgical History:  Procedure Laterality Date  . ABDOMINAL HYSTERECTOMY    . CORONARY ANGIOPLASTY    . EP IMPLANTABLE DEVICE N/A 08/17/2016   Procedure: Pacemaker Implant;  Surgeon: Marinus Maw, MD;  Location: [facility] INVASIVE CV LAB;  Service: Cardiovascular;  Laterality: N/A;  . FOOT SURGERY    . INSERT / REPLACE / REMOVE PACEMAKER  08/17/2016  . KNEE ARTHROSCOPY    . ROTATOR CUFF REPAIR       OB History   None      Home Medications    Prior to Admission medications   Medication Sig Start Date End Date Taking? Authorizing Provider  atenolol (TENORMIN) 50 MG tablet Take 25 mg by mouth every morning.  04/29/15  Yes [provider]  Cholecalciferol (VITAMIN D3) 1000 units CAPS Take 2,000 Units by mouth every morning.    Yes [provider]  Docusate Calcium (STOOL SOFTENER PO) Take 3 capsules by mouth daily.   Yes [provider]  lisinopril-hydrochlorothiazide (PRINZIDE,ZESTORETIC) 10-12.5 MG tablet Take 1 tablet by mouth every morning.  08/16/16  Yes [provider]  loratadine (CLARITIN) 10 MG tablet Take 10 mg by mouth daily.   Yes [provider]  metFORMIN (GLUCOPHAGE-XR) 500 MG 24 hr tablet Take 500 mg by mouth 2 (two) times daily.  07/12/16  Yes [provider]  omeprazole (PRILOSEC) 20 MG capsule Take 20 mg by mouth every morning.  08/16/16  Yes [provider]  rosuvastatin (CRESTOR) 40 MG tablet Take 40 mg by mouth at bedtime.  08/16/16  Yes [provider]  ciprofloxacin (CIPRO) 500 MG tablet Take 1 tablet (500 mg total) by mouth 2 (two) times daily. One po bid x 7 days 02/13/18   Loren Racer, MD  metroNIDAZOLE (FLAGYL) 500 MG tablet Take 1 tablet (500 mg total) by mouth 2 (two) times daily. One po bid x 7 days 02/13/18   Loren Racer, MD  nitroGLYCERIN (NITROSTAT) 0.4 MG SL tablet Place 0.4 mg as needed under the tongue for chest pain. 04/05/17   [provider]  rivaroxaban (XARELTO) 20 MG TABS tablet Take 1  tablet (20 mg total) by mouth daily with supper. 06/16/17   Marinus Mawaylor, Gregg W, MD  traMADol (ULTRAM) 50 MG tablet Take 1 tablet (50 mg total) by mouth every 6 (six) hours as needed for severe pain. 02/13/18   Loren RacerYelverton, Braylea Brancato, MD    Family History Family History  Problem Relation Age of Onset  . Heart disease Mother   . Diabetes Mother   . Heart attack Father   . Cancer - Colon Sister   . Diabetes Sister   . Cancer - Lung Brother   . Cancer - Lung Brother   . Heart disease Son     Social History Social History   Tobacco Use  . Smoking status: Never Smoker  . Smokeless tobacco: Never Used  Substance Use Topics    . Alcohol use: No  . Drug use: No     Allergies   Propoxyphene   Review of Systems Review of Systems  Constitutional: Negative for chills and fever.  Eyes: Negative for visual disturbance.  Respiratory: Negative for cough and shortness of breath.   Cardiovascular: Negative for chest pain, palpitations and leg swelling.  Gastrointestinal: Positive for abdominal distention, abdominal pain and nausea. Negative for blood in stool, constipation, diarrhea and vomiting.  Genitourinary: Negative for dysuria, flank pain, frequency, hematuria, pelvic pain, vaginal bleeding and vaginal discharge.  Musculoskeletal: Negative for back pain, myalgias, neck pain and neck stiffness.  Skin: Negative for rash and wound.  Neurological: Negative for dizziness, weakness, light-headedness, numbness and headaches.  All other systems reviewed and are negative.    Physical Exam Updated Vital Signs BP (!) 154/80 (BP Location: Right Arm)   Pulse 92   Temp 98.6 F (37 C) (Oral)   Resp 15   Ht 5\' 5"  (1.651 m)   Wt 93 kg (205 lb)   SpO2 99%   BMI 34.11 kg/m   Physical Exam  Constitutional: She is oriented to person, place, and time. She appears well-developed and well-nourished. No distress.  HENT:  Head: Normocephalic and atraumatic.  Mouth/Throat: Oropharynx is clear and moist.  Eyes: Pupils are equal, round, and reactive to light. EOM are normal.  Neck: Normal range of motion. Neck supple.  Cardiovascular: Normal rate and regular rhythm.  Pulmonary/Chest: Effort normal and breath sounds normal.  Abdominal: Soft. Bowel sounds are normal. There is tenderness. There is no rebound and no guarding.  Tenderness to palpation in the right lower quadrant.  Guarding is present  Musculoskeletal: Normal range of motion. She exhibits no edema or tenderness.  No CVA tenderness.  Neurological: She is alert and oriented to person, place, and time.  Skin: Skin is warm and dry. Capillary refill takes less  than 2 seconds. No rash noted. She is not diaphoretic. No erythema.  Psychiatric: She has a normal mood and affect. Her behavior is normal.  Nursing note and vitals reviewed.    ED Treatments / Results  Labs (all labs ordered are listed, but only abnormal results are displayed) Labs Reviewed  COMPREHENSIVE METABOLIC PANEL - Abnormal; Notable for the following components:      Result Value   Glucose, Bld 110 (*)    ALT 13 (*)    All other components within normal limits  CBC - Abnormal; Notable for the following components:   RBC 5.30 (*)    HCT 46.7 (*)    All other components within normal limits  LIPASE, BLOOD  URINALYSIS, ROUTINE W REFLEX MICROSCOPIC    EKG None  Radiology Ct  Abdomen Pelvis W Contrast  Result Date: 02/13/2018 CLINICAL DATA:  Recent colonoscopy (02/08/2017). Persistent RIGHT lower quadrant pain. EXAM: CT ABDOMEN AND PELVIS WITH CONTRAST TECHNIQUE: Multidetector CT imaging of the abdomen and pelvis was performed using the standard protocol following bolus administration of intravenous contrast. CONTRAST:  ISOVUE-300 IOPAMIDOL (ISOVUE-300) INJECTION 61% COMPARISON:  None. FINDINGS: Lower chest: Lung bases are clear. Hepatobiliary: No focal hepatic lesion. Large gallstone within noninflamed gallbladder. Pancreas: Pancreas is normal. No ductal dilatation. No pancreatic inflammation. Spleen: Normal spleen Adrenals/urinary tract: Adrenal glands normal. Large simple fluid attenuation cyst of the upper pole RIGHT kidney measures 7 5 cm no renal obstruction. Ureters bladder normal. Stomach/Bowel: Stomach, duodenum small bowel are normal. Terminal ileum is normal. There is mild inflammation of the fat adjacent to the tip of the cecum (images 66 through 73 of series 2). Small amount fluid seen on coronal image 22/5. The appendix is not identified. There is no abscess or perforation. No free fluid. The ascending, transverse, and descending colon normal. Sigmoid colon rectum  normal. Vascular/Lymphatic: Abdominal aorta is normal caliber with atherosclerotic calcification. There is no retroperitoneal or periportal lymphadenopathy. No pelvic lymphadenopathy. Reproductive: Post hysterectomy. Other: No free fluid or free air. Musculoskeletal: No aggressive osseous lesion. IMPRESSION: 1. Mild inflammation adjacent to the tip of the cecum may relate to recent colonoscopy and polyp sampling. No report available. No perforation or abscess identified. Appendix not identified. Cannot completely exclude mild occult appendicitis. 2. No intraperitoneal free air or intraperitoneal free fluid. 3. Large gallstone without evidence of cholecystitis. 4.  Aortic Atherosclerosis (ICD10-I70.0). Electronically Signed   By: Genevive Bi M.D.   On: 02/13/2018 15:32    Procedures Procedures (including critical care time)  Medications Ordered in ED Medications  iopamidol (ISOVUE-300) 61 % injection 100 mL (100 mLs Intravenous Contrast Given 02/13/18 1507)     Initial Impression / Assessment and Plan / ED Course  I have reviewed the triage vital signs and the nursing notes.  Pertinent labs & imaging results that were available during my care of the patient were reviewed by me and considered in my medical decision making (see chart for details).     Patient has mild inflammatory changes around the cecum.  She states is the area that she had a polyp removed.  Patient believes that she has had her appendix removed in the past.  Appendix is not identified on the CT.  Patient has a normal white blood cell count.  No evidence of perforation.  Question post procedural inflammation vs infection. Will start on abx and have F/U with her gastroenterologist.  Return precautions have been given.      Final Clinical Impressions(s) / ED Diagnoses   Final diagnoses:  Right lower quadrant abdominal pain    ED Discharge Orders        Ordered    ciprofloxacin (CIPRO) 500 MG tablet  2 times daily       02/13/18 1545    metroNIDAZOLE (FLAGYL) 500 MG tablet  2 times daily     02/13/18 1545    traMADol (ULTRAM) 50 MG tablet  Every 6 hours PRN     02/13/18 1545       Loren Racer, MD 02/14/18 1449

## 2018-03-28 ENCOUNTER — Ambulatory Visit (INDEPENDENT_AMBULATORY_CARE_PROVIDER_SITE_OTHER): Payer: Medicare Other | Admitting: *Deleted

## 2018-03-28 DIAGNOSIS — I441 Atrioventricular block, second degree: Secondary | ICD-10-CM | POA: Diagnosis not present

## 2018-03-28 NOTE — Progress Notes (Signed)
Remote pacemaker transmission.   

## 2018-03-29 ENCOUNTER — Encounter: Payer: Self-pay | Admitting: Cardiology

## 2018-03-31 LAB — CUP PACEART REMOTE DEVICE CHECK
Battery Remaining Longevity: 150 mo
Battery Remaining Percentage: 100 %
Brady Statistic RV Percent Paced: 48 %
Date Time Interrogation Session: 20190716060100
Implantable Lead Location: 753859
Implantable Lead Serial Number: 819256
Lead Channel Impedance Value: 712 Ohm
Lead Channel Pacing Threshold Amplitude: 0.5 V
Lead Channel Pacing Threshold Pulse Width: 0.4 ms
Lead Channel Setting Pacing Amplitude: 2 V
Lead Channel Setting Pacing Amplitude: 2.5 V
Lead Channel Setting Pacing Pulse Width: 0.4 ms
Lead Channel Setting Sensing Sensitivity: 2.5 mV
MDC IDC LEAD IMPLANT DT: 20171205
MDC IDC LEAD IMPLANT DT: 20171205
MDC IDC LEAD LOCATION: 753860
MDC IDC LEAD SERIAL: 673793
MDC IDC MSMT LEADCHNL RA PACING THRESHOLD PULSEWIDTH: 0.4 ms
MDC IDC MSMT LEADCHNL RV IMPEDANCE VALUE: 816 Ohm
MDC IDC MSMT LEADCHNL RV PACING THRESHOLD AMPLITUDE: 0.8 V
MDC IDC PG IMPLANT DT: 20171205
MDC IDC STAT BRADY RA PERCENT PACED: 33 %
Pulse Gen Serial Number: 732442

## 2018-06-20 ENCOUNTER — Other Ambulatory Visit: Payer: Self-pay | Admitting: Internal Medicine

## 2018-06-27 ENCOUNTER — Ambulatory Visit (INDEPENDENT_AMBULATORY_CARE_PROVIDER_SITE_OTHER): Payer: Medicare Other | Admitting: *Deleted

## 2018-06-27 DIAGNOSIS — I441 Atrioventricular block, second degree: Secondary | ICD-10-CM | POA: Diagnosis not present

## 2018-06-28 NOTE — Progress Notes (Signed)
Remote pacemaker transmission.   

## 2018-08-14 ENCOUNTER — Encounter: Payer: Self-pay | Admitting: Internal Medicine

## 2018-08-21 ENCOUNTER — Encounter: Payer: Self-pay | Admitting: Internal Medicine

## 2018-08-21 ENCOUNTER — Ambulatory Visit: Payer: Medicare Other | Admitting: Internal Medicine

## 2018-08-21 VITALS — BP 132/80 | HR 60 | Ht 65.0 in | Wt 212.0 lb

## 2018-08-21 DIAGNOSIS — I441 Atrioventricular block, second degree: Secondary | ICD-10-CM

## 2018-08-21 DIAGNOSIS — Z950 Presence of cardiac pacemaker: Secondary | ICD-10-CM

## 2018-08-21 DIAGNOSIS — I10 Essential (primary) hypertension: Secondary | ICD-10-CM

## 2018-08-21 DIAGNOSIS — I480 Paroxysmal atrial fibrillation: Secondary | ICD-10-CM | POA: Diagnosis not present

## 2018-08-21 NOTE — Patient Instructions (Signed)

## 2018-08-21 NOTE — Progress Notes (Signed)
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 She also has a history of paroxysmal atrial fibrillation. She denies chest pain or shortness of breath. She admits to some dietary indiscretion and has been unable to lose weight. No syncope.  Allergies  Allergen Reactions  . Propoxyphene Rash     Current Outpatient Medications  Medication Sig Dispense Refill  . atenolol (TENORMIN) 50 MG tablet Take 25 mg by mouth every morning.     . Cholecalciferol (VITAMIN D3) 1000 units CAPS Take 2,000 Units by mouth every morning.     Tery Sanfilippo Calcium (STOOL SOFTENER PO) Take 3 capsules by mouth daily.    Marland Kitchen glipiZIDE (GLUCOTROL XL) 2.5 MG 24 hr tablet Take 2.5 mg by mouth daily.    Marland Kitchen lisinopril-hydrochlorothiazide (PRINZIDE,ZESTORETIC) 10-12.5 MG tablet Take 1 tablet by mouth every morning.     . loratadine (CLARITIN) 10 MG tablet Take 10 mg by mouth daily.    . metFORMIN (GLUCOPHAGE-XR) 500 MG 24 hr tablet Take 500 mg by mouth 2 (two) times daily.     . nitroGLYCERIN (NITROSTAT) 0.4 MG SL tablet Place 0.4 mg as needed under the tongue for chest pain.    Marland Kitchen omeprazole (PRILOSEC) 20 MG capsule Take 20 mg by mouth every morning.     . rosuvastatin (CRESTOR) 40 MG tablet Take 40 mg by mouth at bedtime.     [name] 20 MG TABS tablet TAKE ONE TABLET (20MG  TOTAL) BY MOUTH DAILY WITH SUPPER 30 tablet 5   No current facility-administered medications for this visit.      Past Medical History:  Diagnosis Date  . 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 Coronary artery disease   . Diabetes mellitus without complication (HCC)    type 2  . High cholesterol   . Hypertension   . 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   Past Surgical History:  Procedure Laterality Date  . ABDOMINAL HYSTERECTOMY    .  CORONARY ANGIOPLASTY    . EP IMPLANTABLE DEVICE N/A 08/17/2016   Procedure: Pacemaker Implant;  Surgeon: [name] Maw, MD;  Location: [facility] INVASIVE CV LAB;  Service: Cardiovascular;  Laterality: N/A;  . FOOT SURGERY    . INSERT / REPLACE / REMOVE PACEMAKER  08/17/2016  . KNEE ARTHROSCOPY    . ROTATOR CUFF REPAIR       Family History  Problem Relation Age of Onset  . Heart disease Mother   . Diabetes Mother   . Heart attack Father   . Cancer - Colon Sister   . Diabetes Sister   . Cancer - Lung Brother   . Cancer - Lung Brother   . Heart disease Son      Social History   Socioeconomic History  . Marital status: Divorced    Spouse name: Not on file  . Number of children: Not on file  . Years of education: Not on file  . Highest education level: Not on file  Occupational History  . Not on file  Social Needs  . Financial resource strain: Not on file  . Food insecurity:    Worry: Not on file    Inability: Not on file  . Transportation needs:    Medical: Not on file    Non-medical: Not on file  Tobacco  Use  . Smoking status: Never Smoker  . Smokeless tobacco: Never Used  Substance and Sexual Activity  . Alcohol use: No  . Drug use: No  . Sexual activity: Not on file  Lifestyle  . Physical activity:    Days per week: Not on file    Minutes per session: Not on file  . Stress: Not on file  Relationships  . Social connections:    Talks on phone: Not on file    Gets together: Not on file    Attends religious service: Not on file    Active member of club or organization: Not on file    Attends meetings of clubs or organizations: Not on file    Relationship status: Not on file  . Intimate partner violence:    Fear of current or ex partner: Not on file    Emotionally abused: Not on file    Physically abused: Not on file    Forced sexual activity: Not on file  Other Topics Concern  . Not on file  Social History Narrative  . Not on file     Ht 5\' 5"  (1.651 m)    Wt 212 lb (96.2 kg)   BMI 35.28 kg/m   Physical Exam:  Well appearing but overweight 72 yo woman, NAD HEENT: Unremarkable Neck:  6 cm JVD, no thyromegally Lymphatics:  No adenopathy Back:  No CVA tenderness Lungs:  Clear with no wheezes HEART:  Regular rate rhythm, no murmurs, no rubs, no clicks Abd:  soft, positive bowel sounds, no organomegally, no rebound, no guarding Ext:  2 plus pulses, no edema, no cyanosis, no clubbing Skin:  No rashes no nodules Neuro:  CN II through XII intact, motor grossly intact  EKG - NSR with atrial pacing  DEVICE  Normal device function.  See PaceArt for details.   Assess/Plan: 1. Sinus node dysfunction - she is asymptomatic, s/p PPM insertion. 2. Heart block - she is conducting today. I have reprogrammed her device to try and minimize AV pacing.  3. HTN - her blood pressure is reasonably well controlled. She is encouraged to lose weight. 4. PPM - her [employer] DDD PM is working normally and has been reprogrammed to minimize ventricular pacing.  [name] ReevesGregg Khole [name],M.D.

## 2018-08-30 LAB — CUP PACEART REMOTE DEVICE CHECK
Implantable Lead Implant Date: 20171205
Implantable Lead Location: 753860
Implantable Lead Model: 7741
Implantable Lead Serial Number: 673793
Implantable Lead Serial Number: 819256
Implantable Pulse Generator Implant Date: 20171205
MDC IDC LEAD IMPLANT DT: 20171205
MDC IDC LEAD LOCATION: 753859
MDC IDC SESS DTM: 20191218191503
Pulse Gen Serial Number: 732442

## 2018-09-02 LAB — CUP PACEART INCLINIC DEVICE CHECK
Implantable Lead Implant Date: 20171205
Implantable Lead Location: 753859
Implantable Lead Model: 7741
Implantable Lead Serial Number: 819256
Implantable Pulse Generator Implant Date: 20171205
Lead Channel Setting Pacing Amplitude: 2 V
Lead Channel Setting Pacing Pulse Width: 0.4 ms
MDC IDC LEAD IMPLANT DT: 20171205
MDC IDC LEAD LOCATION: 753860
MDC IDC LEAD SERIAL: 673793
MDC IDC SESS DTM: 20191221172534
MDC IDC SET LEADCHNL RV PACING AMPLITUDE: 2.5 V
MDC IDC SET LEADCHNL RV SENSING SENSITIVITY: 2.5 mV
Pulse Gen Serial Number: 732442

## 2018-09-26 ENCOUNTER — Ambulatory Visit: Payer: Medicare Other

## 2018-09-26 DIAGNOSIS — I480 Paroxysmal atrial fibrillation: Secondary | ICD-10-CM

## 2018-09-26 DIAGNOSIS — I441 Atrioventricular block, second degree: Secondary | ICD-10-CM

## 2018-09-27 NOTE — Progress Notes (Signed)
Remote pacemaker transmission.   

## 2018-09-29 LAB — CUP PACEART REMOTE DEVICE CHECK
Battery Remaining Percentage: 100 %
Brady Statistic RA Percent Paced: 30 %
Date Time Interrogation Session: 20200114070200
Implantable Lead Implant Date: 20171205
Implantable Lead Location: 753859
Implantable Lead Location: 753860
Implantable Lead Serial Number: 673793
Implantable Lead Serial Number: 819256
Lead Channel Impedance Value: 729 Ohm
Lead Channel Pacing Threshold Amplitude: 0.5 V
Lead Channel Pacing Threshold Pulse Width: 0.4 ms
Lead Channel Setting Pacing Amplitude: 2 V
Lead Channel Setting Pacing Amplitude: 2.5 V
Lead Channel Setting Pacing Pulse Width: 0.4 ms
Lead Channel Setting Sensing Sensitivity: 2.5 mV
MDC IDC LEAD IMPLANT DT: 20171205
MDC IDC MSMT BATTERY REMAINING LONGEVITY: 144 mo
MDC IDC MSMT LEADCHNL RA IMPEDANCE VALUE: 713 Ohm
MDC IDC MSMT LEADCHNL RV PACING THRESHOLD AMPLITUDE: 1 V
MDC IDC MSMT LEADCHNL RV PACING THRESHOLD PULSEWIDTH: 0.4 ms
MDC IDC PG IMPLANT DT: 20171205
MDC IDC STAT BRADY RV PERCENT PACED: 45 %
Pulse Gen Serial Number: 732442

## 2018-12-26 ENCOUNTER — Other Ambulatory Visit: Payer: Self-pay

## 2018-12-26 ENCOUNTER — Ambulatory Visit (INDEPENDENT_AMBULATORY_CARE_PROVIDER_SITE_OTHER): Payer: Medicare Other | Admitting: *Deleted

## 2018-12-26 DIAGNOSIS — I480 Paroxysmal atrial fibrillation: Secondary | ICD-10-CM

## 2018-12-26 LAB — CUP PACEART REMOTE DEVICE CHECK
Battery Remaining Longevity: 138 mo
Battery Remaining Percentage: 100 %
Brady Statistic RA Percent Paced: 38 %
Brady Statistic RV Percent Paced: 43 %
Date Time Interrogation Session: 20200414060100
Implantable Lead Implant Date: 20171205
Implantable Lead Implant Date: 20171205
Implantable Lead Location: 753859
Implantable Lead Location: 753860
Implantable Lead Model: 7740
Implantable Lead Model: 7741
Implantable Lead Serial Number: 673793
Implantable Lead Serial Number: 819256
Implantable Pulse Generator Implant Date: 20171205
Lead Channel Impedance Value: 707 Ohm
Lead Channel Impedance Value: 773 Ohm
Lead Channel Pacing Threshold Amplitude: 0.5 V
Lead Channel Pacing Threshold Amplitude: 1.3 V
Lead Channel Pacing Threshold Pulse Width: 0.4 ms
Lead Channel Pacing Threshold Pulse Width: 0.4 ms
Lead Channel Setting Pacing Amplitude: 2 V
Lead Channel Setting Pacing Amplitude: 2.5 V
Lead Channel Setting Pacing Pulse Width: 0.4 ms
Lead Channel Setting Sensing Sensitivity: 2.5 mV
Pulse Gen Serial Number: 732442

## 2019-01-02 ENCOUNTER — Encounter: Payer: Self-pay | Admitting: Cardiology

## 2019-01-02 NOTE — Progress Notes (Signed)
Remote pacemaker transmission.   

## 2019-01-16 ENCOUNTER — Telehealth: Payer: Self-pay | Admitting: *Deleted

## 2019-01-16 ENCOUNTER — Other Ambulatory Visit: Payer: Self-pay | Admitting: Internal Medicine

## 2019-01-16 MED ORDER — RIVAROXABAN 20 MG PO TABS: ORAL_TABLET | ORAL | 5 refills | Status: DC

## 2019-01-16 NOTE — Telephone Encounter (Signed)
crcl Last OV 08/21/18 xarelto 20mg  daily sent to pharmacy

## 2019-01-16 NOTE — Telephone Encounter (Signed)
[facility] [phone] requesting refill  For Xarelto 20mg .

## 2019-03-12 ENCOUNTER — Other Ambulatory Visit: Payer: Self-pay | Admitting: Internal Medicine

## 2019-03-12 ENCOUNTER — Telehealth: Payer: Self-pay

## 2019-03-12 ENCOUNTER — Other Ambulatory Visit: Payer: Medicare Other

## 2019-03-12 DIAGNOSIS — Z20822 Contact with and (suspected) exposure to covid-19: Secondary | ICD-10-CM

## 2019-03-12 NOTE — Telephone Encounter (Signed)
rec'd request from [facility] to schedule pt. For COVID testing.    Phone call to pt.  Scheduled for COVID testing at 10:00 AM, at the [facility] testing site.  Instructed to wear mask and remain in car for testing.  Verb. Understanding.

## 2019-03-15 LAB — NOVEL CORONAVIRUS, NAA: SARS-CoV-2, NAA: NOT DETECTED

## 2019-03-27 ENCOUNTER — Ambulatory Visit (INDEPENDENT_AMBULATORY_CARE_PROVIDER_SITE_OTHER): Payer: Medicare Other | Admitting: *Deleted

## 2019-03-27 DIAGNOSIS — I480 Paroxysmal atrial fibrillation: Secondary | ICD-10-CM

## 2019-03-27 DIAGNOSIS — I441 Atrioventricular block, second degree: Secondary | ICD-10-CM

## 2019-03-27 LAB — CUP PACEART REMOTE DEVICE CHECK
Battery Remaining Longevity: 144 mo
Battery Remaining Percentage: 100 %
Brady Statistic RA Percent Paced: 38 %
Brady Statistic RV Percent Paced: 49 %
Date Time Interrogation Session: 20200714060200
Implantable Lead Implant Date: 20171205
Implantable Lead Implant Date: 20171205
Implantable Lead Location: 753859
Implantable Lead Location: 753860
Implantable Lead Model: 7740
Implantable Lead Model: 7741
Implantable Lead Serial Number: 673793
Implantable Lead Serial Number: 819256
Implantable Pulse Generator Implant Date: 20171205
Lead Channel Impedance Value: 733 Ohm
Lead Channel Impedance Value: 733 Ohm
Lead Channel Pacing Threshold Amplitude: 0.6 V
Lead Channel Pacing Threshold Amplitude: 1.2 V
Lead Channel Pacing Threshold Pulse Width: 0.4 ms
Lead Channel Pacing Threshold Pulse Width: 0.4 ms
Lead Channel Setting Pacing Amplitude: 2 V
Lead Channel Setting Pacing Amplitude: 2.5 V
Lead Channel Setting Pacing Pulse Width: 0.4 ms
Lead Channel Setting Sensing Sensitivity: 2.5 mV
Pulse Gen Serial Number: 732442

## 2019-04-12 NOTE — Progress Notes (Signed)
Remote pacemaker transmission.   

## 2019-06-27 ENCOUNTER — Ambulatory Visit (INDEPENDENT_AMBULATORY_CARE_PROVIDER_SITE_OTHER): Payer: Medicare Other | Admitting: *Deleted

## 2019-06-27 DIAGNOSIS — I480 Paroxysmal atrial fibrillation: Secondary | ICD-10-CM

## 2019-06-27 DIAGNOSIS — I441 Atrioventricular block, second degree: Secondary | ICD-10-CM

## 2019-06-28 LAB — CUP PACEART REMOTE DEVICE CHECK
Battery Remaining Longevity: 150 mo
Battery Remaining Percentage: 100 %
Brady Statistic RA Percent Paced: 37 %
Brady Statistic RV Percent Paced: 40 %
Date Time Interrogation Session: 20201014060100
Implantable Lead Implant Date: 20171205
Implantable Lead Implant Date: 20171205
Implantable Lead Location: 753859
Implantable Lead Location: 753860
Implantable Lead Model: 7740
Implantable Lead Model: 7741
Implantable Lead Serial Number: 673793
Implantable Lead Serial Number: 819256
Implantable Pulse Generator Implant Date: 20171205
Lead Channel Impedance Value: 670 Ohm
Lead Channel Impedance Value: 699 Ohm
Lead Channel Pacing Threshold Amplitude: 0.6 V
Lead Channel Pacing Threshold Amplitude: 1.2 V
Lead Channel Pacing Threshold Pulse Width: 0.4 ms
Lead Channel Pacing Threshold Pulse Width: 0.4 ms
Lead Channel Setting Pacing Amplitude: 2 V
Lead Channel Setting Pacing Amplitude: 2.5 V
Lead Channel Setting Pacing Pulse Width: 0.4 ms
Lead Channel Setting Sensing Sensitivity: 2.5 mV
Pulse Gen Serial Number: 732442

## 2019-07-11 NOTE — Progress Notes (Signed)
Remote pacemaker transmission.   

## 2019-08-20 ENCOUNTER — Other Ambulatory Visit: Payer: Self-pay

## 2019-08-20 DIAGNOSIS — Z20822 Contact with and (suspected) exposure to covid-19: Secondary | ICD-10-CM

## 2019-08-22 LAB — NOVEL CORONAVIRUS, NAA: SARS-CoV-2, NAA: NOT DETECTED

## 2019-08-24 ENCOUNTER — Other Ambulatory Visit: Payer: Self-pay

## 2019-08-24 ENCOUNTER — Encounter: Payer: Self-pay | Admitting: Internal Medicine

## 2019-08-24 ENCOUNTER — Ambulatory Visit (INDEPENDENT_AMBULATORY_CARE_PROVIDER_SITE_OTHER): Payer: Medicare Other | Admitting: Internal Medicine

## 2019-08-24 VITALS — BP 130/80 | HR 127 | Ht 65.0 in | Wt 214.0 lb

## 2019-08-24 DIAGNOSIS — Z950 Presence of cardiac pacemaker: Secondary | ICD-10-CM

## 2019-08-24 DIAGNOSIS — I480 Paroxysmal atrial fibrillation: Secondary | ICD-10-CM

## 2019-08-24 DIAGNOSIS — I10 Essential (primary) hypertension: Secondary | ICD-10-CM | POA: Diagnosis not present

## 2019-08-24 DIAGNOSIS — I441 Atrioventricular block, second degree: Secondary | ICD-10-CM

## 2019-08-24 LAB — CUP PACEART INCLINIC DEVICE CHECK
Battery Remaining Longevity: 138 mo
Brady Statistic RA Percent Paced: 36 %
Brady Statistic RV Percent Paced: 39 %
Date Time Interrogation Session: 20201211000000
Implantable Lead Implant Date: 20171205
Implantable Lead Implant Date: 20171205
Implantable Lead Location: 753859
Implantable Lead Location: 753860
Implantable Lead Model: 7740
Implantable Lead Model: 7741
Implantable Lead Serial Number: 673793
Implantable Lead Serial Number: 819256
Implantable Pulse Generator Implant Date: 20171205
Lead Channel Impedance Value: 1021 Ohm
Lead Channel Impedance Value: 790 Ohm
Lead Channel Pacing Threshold Amplitude: 0.7 V
Lead Channel Pacing Threshold Amplitude: 0.8 V
Lead Channel Pacing Threshold Pulse Width: 0.4 ms
Lead Channel Pacing Threshold Pulse Width: 0.4 ms
Lead Channel Sensing Intrinsic Amplitude: 5.8 mV
Lead Channel Setting Pacing Amplitude: 2 V
Lead Channel Setting Pacing Amplitude: 2.5 V
Lead Channel Setting Pacing Pulse Width: 0.4 ms
Lead Channel Setting Sensing Sensitivity: 2.5 mV
Pulse Gen Serial Number: 732442

## 2019-08-24 NOTE — Progress Notes (Signed)
HPI 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 She also has a history of paroxysmal atrial fibrillation. She denies chest pain or shortness of breath. She admits to some dietary indiscretion and has been unable to lose weight. No syncope.  Allergies  Allergen Reactions  . Propoxyphene Rash     Current Outpatient Medications  Medication Sig Dispense Refill  . atenolol (TENORMIN) 50 MG tablet Take 25 mg by mouth every morning.     . Cholecalciferol (VITAMIN D3) 1000 units CAPS Take 2,000 Units by mouth every morning.     Mariane Baumgarten Calcium (STOOL SOFTENER PO) Take 3 capsules by mouth daily.    Marland Kitchen glipiZIDE (GLUCOTROL XL) 2.5 MG 24 hr tablet Take 2.5 mg by mouth daily.    Marland Kitchen lisinopril-hydrochlorothiazide (PRINZIDE,ZESTORETIC) 10-12.5 MG tablet Take 1 tablet by mouth every morning.     . loratadine (CLARITIN) 10 MG tablet Take 10 mg by mouth daily.    . metFORMIN (GLUCOPHAGE-XR) 500 MG 24 hr tablet Take 500 mg by mouth 2 (two) times daily.     . nitroGLYCERIN (NITROSTAT) 0.4 MG SL tablet Place 0.4 mg as needed under the tongue for chest pain.    Marland Kitchen omeprazole (PRILOSEC) 20 MG capsule Take 20 mg by mouth every morning.     . rivaroxaban (XARELTO) 20 MG TABS tablet TAKE ONE TABLET (20MG  TOTAL) BY MOUTH DAILY WITH SUPPER 30 tablet 5  . rosuvastatin (CRESTOR) 40 MG tablet Take 40 mg by mouth at bedtime.      No current facility-administered medications for this visit.     Past Medical History:  Diagnosis Date  . 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 Coronary artery disease   . Diabetes mellitus without complication ([place])    type 2  . High cholesterol   . Hypertension   . 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   Past Surgical History:  Procedure Laterality Date  . ABDOMINAL  HYSTERECTOMY    . CORONARY ANGIOPLASTY    . EP IMPLANTABLE DEVICE N/A 08/17/2016   Procedure: Pacemaker Implant;  Surgeon: Evans Lance, MD;  Location: [facility];  Service: Cardiovascular;  Laterality: N/A;  . FOOT SURGERY    . INSERT / REPLACE / REMOVE PACEMAKER  08/17/2016  . KNEE ARTHROSCOPY    . ROTATOR CUFF REPAIR       Family History  Problem Relation Age of Onset  . Heart disease Mother   . Diabetes Mother   . Heart attack Father   . Cancer - Colon Sister   . Diabetes Sister   . Cancer - Lung Brother   . Cancer - Lung Brother   . Heart disease Son      Social History   Socioeconomic History  . Marital status: Divorced    Spouse name: Not on file  . Number of children: Not on file  . Years of education: Not on file  . Highest education level: Not on file  Occupational History  . Not on file  Tobacco Use  . Smoking status: Never Smoker  . Smokeless tobacco: Never Used  Substance and Sexual Activity  . Alcohol use: No  . Drug use: No  . Sexual activity: Not on file  Other Topics Concern  . Not on file  Social History  Narrative  . Not on file   Social Determinants of Health   Financial Resource Strain:   . Difficulty of Paying Living Expenses: Not on file  Food Insecurity:   . Worried About Programme researcher, broadcasting/film/video in the Last Year: Not on file  . Ran Out of Food in the Last Year: Not on file  Transportation Needs:   . Lack of Transportation (Medical): Not on file  . Lack of Transportation (Non-Medical): Not on file  Physical Activity:   . Days of Exercise per Week: Not on file  . Minutes of Exercise per Session: Not on file  Stress:   . Feeling of Stress : Not on file  Social Connections:   . Frequency of Communication with Friends and Family: Not on file  . Frequency of Social Gatherings with Friends and Family: Not on file  . Attends Religious Services: Not on file  . Active Member of Clubs or Organizations: Not on file  . Attends Tax inspector Meetings: Not on file  . Marital Status: Not on file  Intimate Partner Violence:   . Fear of Current or Ex-Partner: Not on file  . Emotionally Abused: Not on file  . Physically Abused: Not on file  . Sexually Abused: Not on file     BP 130/80   Pulse (!) 127   Ht 5\' 5"  (1.651 m)   Wt 214 lb (97.1 kg)   SpO2 95%   BMI 35.61 kg/m   Physical Exam:  Well appearing NAD HEENT: Unremarkable Neck:  No JVD, no thyromegally Lymphatics:  No adenopathy Back:  No CVA tenderness Lungs:  Clear HEART:  Regular rate rhythm, no murmurs, no rubs, no clicks Abd:  soft, positive bowel sounds, no organomegally, no rebound, no guarding Ext:  2 plus pulses, no edema, no cyanosis, no clubbing Skin:  No rashes no nodules Neuro:  CN II through XII intact, motor grossly intact  EKG - nsr with ventricular paced,   DEVICE  Normal device function.  See PaceArt for details.   Assess/Plan: 1. CHB - she is asymptomatic, s/p PPM insertion. No change 2. PPM - her DDD PM is working normally. We will recheck in several months.  3. HTN - her bp is only minimally elevated. I asked her to lose weight and maintain a low sodium diet. 4. Obesity - I have encouraged the patient to get under 200 lbs before I see her back.   [employer].D.

## 2019-08-24 NOTE — Patient Instructions (Signed)

## 2019-09-26 ENCOUNTER — Ambulatory Visit (INDEPENDENT_AMBULATORY_CARE_PROVIDER_SITE_OTHER): Payer: Medicare Other | Admitting: *Deleted

## 2019-09-26 DIAGNOSIS — I480 Paroxysmal atrial fibrillation: Secondary | ICD-10-CM | POA: Diagnosis not present

## 2019-09-26 LAB — CUP PACEART REMOTE DEVICE CHECK
Battery Remaining Longevity: 138 mo
Battery Remaining Percentage: 100 %
Brady Statistic RA Percent Paced: 36 %
Brady Statistic RV Percent Paced: 40 %
Date Time Interrogation Session: 20210113020100
Implantable Lead Implant Date: 20171205
Implantable Lead Implant Date: 20171205
Implantable Lead Location: 753859
Implantable Lead Location: 753860
Implantable Lead Model: 7740
Implantable Lead Model: 7741
Implantable Lead Serial Number: 673793
Implantable Lead Serial Number: 819256
Implantable Pulse Generator Implant Date: 20171205
Lead Channel Impedance Value: 689 Ohm
Lead Channel Impedance Value: 732 Ohm
Lead Channel Pacing Threshold Amplitude: 0.7 V
Lead Channel Pacing Threshold Amplitude: 1.2 V
Lead Channel Pacing Threshold Pulse Width: 0.4 ms
Lead Channel Pacing Threshold Pulse Width: 0.4 ms
Lead Channel Setting Pacing Amplitude: 2 V
Lead Channel Setting Pacing Amplitude: 2.5 V
Lead Channel Setting Pacing Pulse Width: 0.4 ms
Lead Channel Setting Sensing Sensitivity: 2.5 mV
Pulse Gen Serial Number: 732442

## 2019-12-18 ENCOUNTER — Telehealth: Payer: Self-pay

## 2019-12-18 NOTE — Telephone Encounter (Signed)
[employer] alert received that Atrial threshold testing was suspended.  Need to have pt send manual transmission to determine if continued issue with lead.  Spoke with pt, she will try sending transmission when she can in morning.

## 2019-12-19 NOTE — Telephone Encounter (Signed)
I called the pt to help her send a manual transmission but was unsuccessful. I gave her the number to [employer] tech support to get additional help.

## 2019-12-20 NOTE — Telephone Encounter (Signed)
Transmission received and normal.

## 2019-12-26 ENCOUNTER — Ambulatory Visit (INDEPENDENT_AMBULATORY_CARE_PROVIDER_SITE_OTHER): Payer: Medicare Other | Admitting: *Deleted

## 2019-12-26 ENCOUNTER — Telehealth: Payer: Self-pay

## 2019-12-26 DIAGNOSIS — I480 Paroxysmal atrial fibrillation: Secondary | ICD-10-CM

## 2019-12-26 LAB — CUP PACEART REMOTE DEVICE CHECK
Battery Remaining Longevity: 132 mo
Battery Remaining Percentage: 100 %
Brady Statistic RA Percent Paced: 39 %
Brady Statistic RV Percent Paced: 39 %
Date Time Interrogation Session: 20210414020100
Implantable Lead Implant Date: 20171205
Implantable Lead Implant Date: 20171205
Implantable Lead Location: 753859
Implantable Lead Location: 753860
Implantable Lead Model: 7740
Implantable Lead Model: 7741
Implantable Lead Serial Number: 673793
Implantable Lead Serial Number: 819256
Implantable Pulse Generator Implant Date: 20171205
Lead Channel Impedance Value: 707 Ohm
Lead Channel Impedance Value: 727 Ohm
Lead Channel Pacing Threshold Amplitude: 0.7 V
Lead Channel Pacing Threshold Amplitude: 1.1 V
Lead Channel Pacing Threshold Pulse Width: 0.4 ms
Lead Channel Pacing Threshold Pulse Width: 0.4 ms
Lead Channel Setting Pacing Amplitude: 2 V
Lead Channel Setting Pacing Amplitude: 2.5 V
Lead Channel Setting Pacing Pulse Width: 0.4 ms
Lead Channel Setting Sensing Sensitivity: 2.5 mV
Pulse Gen Serial Number: 732442

## 2019-12-26 NOTE — Telephone Encounter (Signed)
I let the pt know we got her transmission.

## 2019-12-26 NOTE — Progress Notes (Signed)
PPM Remote  

## 2020-03-19 ENCOUNTER — Other Ambulatory Visit: Payer: Self-pay | Admitting: Internal Medicine

## 2020-03-19 NOTE — Telephone Encounter (Signed)
Xarelto 20mg  refill request received. Pt is 74 years old, weight-97.1 kg, Crea-0.95 on 12/04/2019 via [facility] last seen by [facility]. [facility] on 08/24/2019, Diagnosis-Afib, CrCl-80.60ml/min; Dose is appropriate based on dosing criteria. Will send in refill to requested pharmacy.

## 2020-03-26 ENCOUNTER — Ambulatory Visit (INDEPENDENT_AMBULATORY_CARE_PROVIDER_SITE_OTHER): Payer: Medicare Other | Admitting: *Deleted

## 2020-03-26 DIAGNOSIS — I480 Paroxysmal atrial fibrillation: Secondary | ICD-10-CM

## 2020-03-26 LAB — CUP PACEART REMOTE DEVICE CHECK
Battery Remaining Longevity: 126 mo
Battery Remaining Percentage: 100 %
Brady Statistic RA Percent Paced: 41 %
Brady Statistic RV Percent Paced: 39 %
Date Time Interrogation Session: 20210714020200
Implantable Lead Implant Date: 20171205
Implantable Lead Implant Date: 20171205
Implantable Lead Location: 753859
Implantable Lead Location: 753860
Implantable Lead Model: 7740
Implantable Lead Model: 7741
Implantable Lead Serial Number: 673793
Implantable Lead Serial Number: 819256
Implantable Pulse Generator Implant Date: 20171205
Lead Channel Impedance Value: 713 Ohm
Lead Channel Impedance Value: 751 Ohm
Lead Channel Pacing Threshold Amplitude: 0.6 V
Lead Channel Pacing Threshold Amplitude: 1.3 V
Lead Channel Pacing Threshold Pulse Width: 0.4 ms
Lead Channel Pacing Threshold Pulse Width: 0.4 ms
Lead Channel Setting Pacing Amplitude: 2 V
Lead Channel Setting Pacing Amplitude: 2.5 V
Lead Channel Setting Pacing Pulse Width: 0.4 ms
Lead Channel Setting Sensing Sensitivity: 2.5 mV
Pulse Gen Serial Number: 732442

## 2020-03-27 NOTE — Progress Notes (Signed)
Remote pacemaker transmission.   

## 2020-05-19 ENCOUNTER — Other Ambulatory Visit: Payer: Self-pay

## 2020-05-19 ENCOUNTER — Ambulatory Visit
Admission: EM | Admit: 2020-05-19 | Discharge: 2020-05-19 | Disposition: A | Discharge status: Home / Self Care | Payer: Medicare Other | Attending: Emergency Medicine | Admitting: Emergency Medicine

## 2020-05-19 ENCOUNTER — Telehealth: Payer: Self-pay

## 2020-05-19 DIAGNOSIS — R300 Dysuria: Secondary | ICD-10-CM | POA: Diagnosis present

## 2020-05-19 DIAGNOSIS — N390 Urinary tract infection, site not specified: Secondary | ICD-10-CM | POA: Diagnosis present

## 2020-05-19 LAB — POCT URINALYSIS DIP (MANUAL ENTRY)
Glucose, UA: 100 mg/dL — AB
Nitrite, UA: POSITIVE — AB
Protein Ur, POC: >=300 mg/dL — AB
Spec Grav, UA: 1.025 (ref 1.010–1.025)
Urobilinogen, UA: 1 E.U./dL
pH, UA: 5 (ref 5.0–8.0)

## 2020-05-19 MED ORDER — SULFAMETHOXAZOLE-TRIMETHOPRIM 800-160 MG PO TABS: 1.0000 | ORAL_TABLET | Freq: Two times a day (BID) | ORAL | 0 refills | Status: AC

## 2020-05-19 MED ORDER — PHENAZOPYRIDINE HCL 100 MG PO TABS: 100.0000 mg | ORAL_TABLET | Freq: Three times a day (TID) | ORAL | 0 refills | Status: DC | PRN

## 2020-05-19 MED ORDER — SULFAMETHOXAZOLE-TRIMETHOPRIM 800-160 MG PO TABS: 1.0000 | ORAL_TABLET | Freq: Two times a day (BID) | ORAL | 0 refills | Status: DC

## 2020-05-19 NOTE — ED Triage Notes (Signed)
Pt presents with dysuria that began last week, took 5 days of macrobid and no relief

## 2020-05-19 NOTE — Discharge Instructions (Addendum)
Urine culture sent.  We will call you with abnormal results.   Push fluids and get plenty of rest.   Take antibiotic as directed and to completion Take pyridium as prescribed and as needed for symptomatic relief Follow up with PCP if symptoms persists Return here or go to ER if you have any new or worsening symptoms such as fever, worsening abdominal pain, nausea/vomiting, flank pain, etc..Marland Kitchen

## 2020-05-19 NOTE — ED Provider Notes (Signed)
[name] [name]  Chief Complaint  Patient presents with  . Dysuria     SUBJECTIVE:  [name] [name] is a 74 y.o. female who presents to the urgent care for complaint of dysuria last started last week..  Patient denies a precipitating event, recent sexual encounter, excessive caffeine intake.    Has tried 5 days course of Macrobid with no relief.  Symptoms are made worse with urination.  Admits to similar symptoms in the past.  Denies fever, chills, nausea, vomiting, abdominal pain, flank pain, abnormal vaginal discharge or bleeding, hematuria.    LMP: No LMP recorded. Patient has had a hysterectomy.  ROS: As in HPI.  All other pertinent ROS negative.     Past Medical History:  Diagnosis Date  . 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 Coronary artery disease   . Diabetes mellitus without complication (HCC)    type 2  . High cholesterol   . Hypertension   . Presence of permanent cardiac pacemaker 08/17/2016   Past Surgical History:  Procedure Laterality Date  . ABDOMINAL HYSTERECTOMY    . CORONARY ANGIOPLASTY    . EP IMPLANTABLE DEVICE N/A 08/17/2016   Procedure: Pacemaker Implant;  Surgeon: Marinus Maw, MD;  Location: [name];  Service: Cardiovascular;  Laterality: N/A;  . FOOT SURGERY    . INSERT / REPLACE / REMOVE PACEMAKER  08/17/2016  . KNEE ARTHROSCOPY    . ROTATOR CUFF REPAIR     Allergies  Allergen Reactions  . Propoxyphene Rash   No current facility-administered medications on file prior to encounter.   Current Outpatient Medications on File Prior to Encounter  Medication Sig Dispense Refill  . atenolol (TENORMIN) 50 MG tablet Take 25 mg by mouth every morning.     . Cholecalciferol (VITAMIN D3) 1000 units CAPS Take 2,000 Units by mouth every morning.     Tery Sanfilippo Calcium (STOOL SOFTENER PO) Take 3 capsules by mouth daily.    Marland Kitchen glipiZIDE (GLUCOTROL XL) 2.5 MG 24 hr tablet Take 2.5 mg by mouth daily.    Marland Kitchen lisinopril-hydrochlorothiazide  (PRINZIDE,ZESTORETIC) 10-12.5 MG tablet Take 1 tablet by mouth every morning.     . loratadine (CLARITIN) 10 MG tablet Take 10 mg by mouth daily.    . metFORMIN (GLUCOPHAGE-XR) 500 MG 24 hr tablet Take 500 mg by mouth 2 (two) times daily.     . nitroGLYCERIN (NITROSTAT) 0.4 MG SL tablet Place 0.4 mg as needed under the tongue for chest pain.    Marland Kitchen omeprazole (PRILOSEC) 20 MG capsule Take 20 mg by mouth every morning.     . rosuvastatin (CRESTOR) 40 MG tablet Take 40 mg by mouth at bedtime.     [name] 20 MG TABS tablet TAKE ONE TABLET (20MG  DOSE) BY MOUTH DAILY 90 tablet 1   Social History   Socioeconomic History  . Marital status: Divorced    Spouse name: Not on file  . Number of children: Not on file  . Years of education: Not on file  . Highest education level: Not on file  Occupational History  . Not on file  Tobacco Use  . Smoking status: Never Smoker  . Smokeless tobacco: Never Used  Vaping Use  . Vaping Use: Never used  Substance and Sexual Activity  . Alcohol use: No  . Drug use: No  . Sexual activity: Not on file  Other Topics Concern  . Not on file  Social History Narrative  . Not on file  Social Determinants of Health   Financial Resource Strain:   . Difficulty of Paying Living Expenses: Not on file  Food Insecurity:   . Worried About Programme researcher, broadcasting/film/video in the Last Year: Not on file  . Ran Out of Food in the Last Year: Not on file  Transportation Needs:   . Lack of Transportation (Medical): Not on file  . Lack of Transportation (Non-Medical): Not on file  Physical Activity:   . Days of Exercise per Week: Not on file  . Minutes of Exercise per Session: Not on file  Stress:   . Feeling of Stress : Not on file  Social Connections:   . Frequency of Communication with Friends and Family: Not on file  . Frequency of Social Gatherings with Friends and Family: Not on file  . Attends Religious Services: Not on file  . Active Member of Clubs or Organizations:  Not on file  . Attends Banker Meetings: Not on file  . Marital Status: Not on file  Intimate Partner Violence:   . Fear of Current or Ex-Partner: Not on file  . Emotionally Abused: Not on file  . Physically Abused: Not on file  . Sexually Abused: Not on file   Family History  Problem Relation Age of Onset  . Heart disease Mother   . Diabetes Mother   . Heart attack Father   . Cancer - Colon Sister   . Diabetes Sister   . Cancer - Lung Brother   . Cancer - Lung Brother   . Heart disease Son     OBJECTIVE:  Vitals:   05/19/20 1358  BP: 112/73  Pulse: 79  Resp: 20  Temp: 99 F (37.2 C)  SpO2: 94%   General appearance: AOx3 in no acute distress HEENT: NCAT.  Oropharynx clear.  Lungs: clear to auscultation bilaterally without adventitious breath sounds Heart: regular rate and rhythm.  Radial pulses 2+ symmetrical bilaterally Abdomen: soft; non-distended; no tenderness; bowel sounds present; no guarding or rebound tenderness Back: no CVA tenderness Extremities: no edema; symmetrical with no gross deformities Skin: warm and dry Neurologic: Ambulates from chair to exam table without difficulty Psychological: alert and cooperative; normal mood and affect  Labs Reviewed  POCT URINALYSIS DIP (MANUAL ENTRY) - Abnormal; Notable for the following components:      Result Value   Color, UA orange (*)    Clarity, UA cloudy (*)    Glucose, UA =100 (*)    Bilirubin, UA small (*)    Ketones, POC UA small (15) (*)    Blood, UA moderate (*)    Protein Ur, POC >=300 (*)    Nitrite, UA Positive (*)    Leukocytes, UA Large (3+) (*)    All other components within normal limits  URINE CULTURE    ASSESSMENT & PLAN:  1. Dysuria   2. Acute lower UTI     Meds ordered this encounter  Medications  . sulfamethoxazole-trimethoprim (BACTRIM DS) 800-160 MG tablet    Sig: Take 1 tablet by mouth 2 (two) times daily for 7 days.    Dispense:  14 tablet    Refill:  0  .  phenazopyridine (PYRIDIUM) 100 MG tablet    Sig: Take 1 tablet (100 mg total) by mouth 3 (three) times daily as needed for pain.    Dispense:  10 tablet    Refill:  0    Discharge instructions  Urine culture sent.  We will call you with abnormal  results.   Push fluids and get plenty of rest.   Take antibiotic as directed and to completion Take pyridium as prescribed and as needed for symptomatic relief Follow up with PCP if symptoms persists Return here or go to ER if you have any new or worsening symptoms such as fever, worsening abdominal pain, nausea/vomiting, flank pain, etc...  Outlined signs and symptoms indicating need for more acute intervention. Patient verbalized understanding. After Visit Summary given.     Note: This document was prepared using Dragon voice recognition software and may include unintentional dictation errors.     Durward Parcel, FNP 05/19/20 [phone]

## 2020-05-22 ENCOUNTER — Telehealth (HOSPITAL_COMMUNITY): Payer: Self-pay | Admitting: Emergency Medicine

## 2020-05-22 LAB — URINE CULTURE: Culture: >=100000 — AB

## 2020-05-22 MED ORDER — NITROFURANTOIN MONOHYD MACRO 100 MG PO CAPS: 100.0000 mg | ORAL_CAPSULE | Freq: Two times a day (BID) | ORAL | 0 refills | Status: AC

## 2020-06-05 ENCOUNTER — Encounter: Payer: Self-pay | Admitting: Urology

## 2020-06-05 ENCOUNTER — Other Ambulatory Visit: Payer: Self-pay

## 2020-06-05 ENCOUNTER — Ambulatory Visit (INDEPENDENT_AMBULATORY_CARE_PROVIDER_SITE_OTHER): Payer: Medicare Other | Admitting: Urology

## 2020-06-05 VITALS — BP 110/64 | HR 59 | Temp 98.4°F | Ht 65.0 in | Wt 214.0 lb

## 2020-06-05 DIAGNOSIS — N390 Urinary tract infection, site not specified: Secondary | ICD-10-CM

## 2020-06-05 LAB — URINALYSIS, ROUTINE W REFLEX MICROSCOPIC
Bilirubin, UA: NEGATIVE
Glucose, UA: NEGATIVE
Ketones, UA: NEGATIVE
Leukocytes,UA: NEGATIVE
Nitrite, UA: NEGATIVE
Protein,UA: NEGATIVE
RBC, UA: NEGATIVE
Specific Gravity, UA: 1.02 (ref 1.005–1.030)
Urobilinogen, Ur: 0.2 mg/dL (ref 0.2–1.0)
pH, UA: 5.5 (ref 5.0–7.5)

## 2020-06-05 LAB — BLADDER SCAN AMB NON-IMAGING: Scan Result: 61

## 2020-06-05 MED ORDER — NITROFURANTOIN MACROCRYSTAL 50 MG PO CAPS: 50.0000 mg | ORAL_CAPSULE | Freq: Every evening | ORAL | 3 refills | Status: DC

## 2020-06-05 NOTE — Patient Instructions (Signed)
Urinary Tract Infection, Adult A urinary tract infection (UTI) is an infection of any part of the urinary tract. The urinary tract includes:  The kidneys.  The ureters.  The bladder.  The urethra. These organs make, store, and get rid of pee (urine) in the body. What are the causes? This is caused by germs (bacteria) in your genital area. These germs grow and cause swelling (inflammation) of your urinary tract. What increases the risk? You are more likely to develop this condition if:  You have a small, thin tube (catheter) to drain pee.  You cannot control when you pee or poop (incontinence).  You are female, and: ? You use these methods to prevent pregnancy:  A medicine that kills sperm (spermicide).  A device that blocks sperm (diaphragm). ? You have low levels of a female hormone (estrogen). ? You are pregnant.  You have genes that add to your risk.  You are sexually active.  You take antibiotic medicines.  You have trouble peeing because of: ? A prostate that is bigger than normal, if you are female. ? A blockage in the part of your body that drains pee from the bladder (urethra). ? A kidney stone. ? A nerve condition that affects your bladder (neurogenic bladder). ? Not getting enough to drink. ? Not peeing often enough.  You have other conditions, such as: ? Diabetes. ? A weak disease-fighting system (immune system). ? Sickle cell disease. ? Gout. ? Injury of the spine. What are the signs or symptoms? Symptoms of this condition include:  Needing to pee right away (urgently).  Peeing often.  Peeing small amounts often.  Pain or burning when peeing.  Blood in the pee.  Pee that smells bad or not like normal.  Trouble peeing.  Pee that is cloudy.  Fluid coming from the vagina, if you are female.  Pain in the belly or lower back. Other symptoms include:  Throwing up (vomiting).  No urge to eat.  Feeling mixed up (confused).  Being tired  and grouchy (irritable).  A fever.  Watery poop (diarrhea). How is this treated? This condition may be treated with:  Antibiotic medicine.  Other medicines.  Drinking enough water. Follow these instructions at home:  Medicines  Take over-the-counter and prescription medicines only as told by your doctor.  If you were prescribed an antibiotic medicine, take it as told by your doctor. Do not stop taking it even if you start to feel better. General instructions  Make sure you: ? Pee until your bladder is empty. ? Do not hold pee for a long time. ? Empty your bladder after sex. ? Wipe from front to back after pooping if you are a female. Use each tissue one time when you wipe.  Drink enough fluid to keep your pee pale yellow.  Keep all follow-up visits as told by your doctor. This is important. Contact a doctor if:  You do not get better after 1-2 days.  Your symptoms go away and then come back. Get help right away if:  You have very bad back pain.  You have very bad pain in your lower belly.  You have a fever.  You are sick to your stomach (nauseous).  You are throwing up. Summary  A urinary tract infection (UTI) is an infection of any part of the urinary tract.  This condition is caused by germs in your genital area.  There are many risk factors for a UTI. These include having a small, thin   tube to drain pee and not being able to control when you pee or poop.  Treatment includes antibiotic medicines for germs.  Drink enough fluid to keep your pee pale yellow. This information is not intended to replace advice given to you by your health care provider. Make sure you discuss any questions you have with your health care provider. Document Revised: 08/17/2018 Document Reviewed: 03/09/2018 Elsevier Patient Education  2020 Elsevier Inc.  

## 2020-06-05 NOTE — Progress Notes (Signed)
Urological Symptom Review  Patient is experiencing the following symptoms: Get up at night to urinate Urinary tract infection   Review of Systems  Gastrointestinal (upper)  : Negative for upper GI symptoms  Gastrointestinal (lower) : Negative for lower GI symptoms  Constitutional : Negative for symptoms  Skin: Negative for skin symptoms  Eyes: Negative for eye symptoms  Ear/Nose/Throat : Sinus problems  Hematologic/Lymphatic: Negative for Hematologic/Lymphatic symptoms  Cardiovascular : Negative for cardiovascular symptoms  Respiratory : Negative for respiratory symptoms  Endocrine: Negative for endocrine symptoms  Musculoskeletal: Negative for musculoskeletal symptoms  Neurological: Negative for neurological symptoms  Psychologic: Negative for psychiatric symptoms

## 2020-06-05 NOTE — Progress Notes (Signed)
06/05/2020 10:40 AM   [name] [name] 1946-02-02 [id number]  Referring provider: Eartha Inch, MD [address] [place],  [place] [address]  Recurrent UTI  HPI: Ms Duquette is a 74yo her for evaluation of recurrent UTI. Starting 50 years ago she was placed on prophylactic antibiotics which prevented the UTIs. She stopped it over 30 years ago. She gets UTIs often after intercourse. Last culture was 05/19/2020 grew E coli resistent to bactrim. She was placed on nitrofurintoin    PMH: Past Medical History:  Diagnosis Date  . Atrial fibrillation (HCC)   . 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 Coronary artery disease   . Diabetes mellitus without complication (HCC)    type 2  . GERD (gastroesophageal reflux disease)   . High cholesterol   . Hypertension   . Presence of permanent cardiac pacemaker 08/17/2016    Surgical History: Past Surgical History:  Procedure Laterality Date  . ABDOMINAL HYSTERECTOMY    . CORONARY ANGIOPLASTY    . EP IMPLANTABLE DEVICE N/A 08/17/2016   Procedure: Pacemaker Implant;  Surgeon: Marinus Maw, MD;  Location: [facility] INVASIVE CV LAB;  Service: Cardiovascular;  Laterality: N/A;  . FOOT SURGERY    . INSERT / REPLACE / REMOVE PACEMAKER  08/17/2016  . KNEE ARTHROSCOPY    . ROTATOR CUFF REPAIR      Home Medications:  Allergies as of 06/05/2020      Reactions   Propoxyphene Rash      Medication List       Accurate as of June 05, 2020 10:40 AM. If you have any questions, ask your nurse or doctor.        atenolol 50 MG tablet Commonly known as: TENORMIN Take 25 mg by mouth every morning.   glipiZIDE 2.5 MG 24 hr tablet Commonly known as: GLUCOTROL XL Take 2.5 mg by mouth daily.   lisinopril-hydrochlorothiazide 10-12.5 MG tablet Commonly known as: ZESTORETIC Take 1 tablet by mouth every morning.   loratadine 10 MG tablet Commonly known as: CLARITIN Take 10 mg by mouth daily.   metFORMIN 500 MG 24 hr tablet Commonly known as:  GLUCOPHAGE-XR Take 500 mg by mouth 2 (two) times daily.   nitroGLYCERIN 0.4 MG SL tablet Commonly known as: NITROSTAT Place 0.4 mg as needed under the tongue for chest pain.   omeprazole 20 MG capsule Commonly known as: PRILOSEC Take 20 mg by mouth every morning.   phenazopyridine 100 MG tablet Commonly known as: Pyridium Take 1 tablet (100 mg total) by mouth 3 (three) times daily as needed for pain.   rosuvastatin 40 MG tablet Commonly known as: CRESTOR Take 40 mg by mouth at bedtime.   STOOL SOFTENER PO Take 3 capsules by mouth daily.   Vitamin D3 25 MCG (1000 UT) Caps Take 2,000 Units by mouth every morning.   Xarelto 20 MG Tabs tablet Generic drug: rivaroxaban TAKE ONE TABLET (20MG  DOSE) BY MOUTH DAILY       Allergies:  Allergies  Allergen Reactions  . Propoxyphene Rash    Family History: Family History  Problem Relation Age of Onset  . Heart disease Mother   . Diabetes Mother   . Heart attack Father   . Cancer - Colon Sister   . Diabetes Sister   . Cancer - Lung Brother   . Cancer - Lung Brother   . Heart disease Son     Social History:  reports that she has never smoked. She has never used  smokeless tobacco. She reports that she does not drink alcohol and does not use drugs.  ROS: All other review of systems were reviewed and are negative except what is noted above in HPI  Physical Exam: BP 110/64   Pulse (!) 59   Temp 98.4 F (36.9 C)   Ht 5\' 5"  (1.651 m)   Wt 214 lb (97.1 kg)   BMI 35.61 kg/m   Constitutional:  Alert and oriented, No acute distress. HEENT: [place] AT, moist mucus membranes.  Trachea midline, no masses. Cardiovascular: No clubbing, cyanosis, or edema. Respiratory: Normal respiratory effort, no increased work of breathing. GI: Abdomen is soft, nontender, nondistended, no abdominal masses GU: No CVA tenderness.  Lymph: No cervical or inguinal lymphadenopathy. Skin: No rashes, bruises or suspicious lesions. Neurologic: Grossly  intact, no focal deficits, moving all 4 extremities. Psychiatric: Normal mood and affect.  Laboratory Data: Lab Results  Component Value Date   WBC 8.1 02/13/2018   HGB 14.9 02/13/2018   HCT 46.7 (H) 02/13/2018   MCV 88.1 02/13/2018   PLT 264 02/13/2018    Lab Results  Component Value Date   CREATININE 0.78 02/13/2018    No results found for: PSA  No results found for: TESTOSTERONE  No results found for: HGBA1C  Urinalysis    Component Value Date/Time   COLORURINE YELLOW 02/13/2018 1200   APPEARANCEUR CLEAR 02/13/2018 1200   LABSPEC 1.010 02/13/2018 1200   PHURINE 5.0 02/13/2018 1200   GLUCOSEU NEGATIVE 02/13/2018 1200   HGBUR NEGATIVE 02/13/2018 1200   BILIRUBINUR small (A) 05/19/2020 1409   KETONESUR small (15) (A) 05/19/2020 1409   KETONESUR NEGATIVE 02/13/2018 1200   PROTEINUR >=300 (A) 05/19/2020 1409   PROTEINUR NEGATIVE 02/13/2018 1200   UROBILINOGEN 1.0 05/19/2020 1409   UROBILINOGEN >8.0 (H) 05/09/2014 0423   NITRITE Positive (A) 05/19/2020 1409   NITRITE NEGATIVE 02/13/2018 1200   LEUKOCYTESUR Large (3+) (A) 05/19/2020 1409    Lab Results  Component Value Date   BACTERIA FEW (A) 05/09/2014    Pertinent Imaging:  No results found for this or any previous visit.  No results found for this or any previous visit.  No results found for this or any previous visit.  No results found for this or any previous visit.  No results found for this or any previous visit.  No results found for this or any previous visit.  No results found for this or any previous visit.  No results found for this or any previous visit.   Assessment & Plan:    1. Recurrent UTI -We will start macrobid 50mg  qhs -RTC 3 months - BLADDER SCAN AMB NON-IMAGING - Urinalysis, Routine w reflex microscopic   No follow-ups on file.  05/11/2014, MD  [facility] [place]

## 2020-06-25 ENCOUNTER — Ambulatory Visit (INDEPENDENT_AMBULATORY_CARE_PROVIDER_SITE_OTHER): Payer: Medicare Other

## 2020-06-25 DIAGNOSIS — I441 Atrioventricular block, second degree: Secondary | ICD-10-CM | POA: Diagnosis not present

## 2020-06-27 ENCOUNTER — Telehealth: Payer: Self-pay | Admitting: Internal Medicine

## 2020-06-27 NOTE — Telephone Encounter (Signed)
Patient thought she sent her transmission in on 10/13 but she received a call that it did not go through. She tried again then to send another one and still does not think it is going through.

## 2020-06-27 NOTE — Telephone Encounter (Signed)
Calling patient to assist with monitor.   Patient states she is not at home right now to send a transmission. Patient advised when she gets home to unplug the monitor for 30 seconds then plug it back in. Try to send another transmission. I will check to see if it is received on Monday 06/30/20, I will call her either way. If it does not go though we will call [employer] to get help with the monitor. Patient agreeable to plan. Verbalized understanding.

## 2020-06-30 NOTE — Telephone Encounter (Signed)
Following up with patient on manual transmission. Paitent advised to unplug remote box and attempt to send one.   Presenting: AP/VS 60. Lead trends appear stable.

## 2020-07-01 LAB — CUP PACEART REMOTE DEVICE CHECK
Battery Remaining Longevity: 126 mo
Battery Remaining Percentage: 100 %
Brady Statistic RA Percent Paced: 44 %
Brady Statistic RV Percent Paced: 37 %
Date Time Interrogation Session: 20211018143300
Implantable Lead Implant Date: 20171205
Implantable Lead Implant Date: 20171205
Implantable Lead Location: 753859
Implantable Lead Location: 753860
Implantable Lead Model: 7740
Implantable Lead Model: 7741
Implantable Lead Serial Number: 673793
Implantable Lead Serial Number: 819256
Implantable Pulse Generator Implant Date: 20171205
Lead Channel Impedance Value: 623 Ohm
Lead Channel Impedance Value: 709 Ohm
Lead Channel Pacing Threshold Amplitude: 0.6 V
Lead Channel Pacing Threshold Amplitude: 1.4 V
Lead Channel Pacing Threshold Pulse Width: 0.4 ms
Lead Channel Pacing Threshold Pulse Width: 0.4 ms
Lead Channel Setting Pacing Amplitude: 2 V
Lead Channel Setting Pacing Amplitude: 2.5 V
Lead Channel Setting Pacing Pulse Width: 0.4 ms
Lead Channel Setting Sensing Sensitivity: 2.5 mV
Pulse Gen Serial Number: 732442

## 2020-07-01 NOTE — Progress Notes (Signed)
Remote pacemaker transmission.   

## 2020-09-22 ENCOUNTER — Ambulatory Visit: Payer: Medicare Other | Admitting: Urology

## 2020-09-23 ENCOUNTER — Encounter: Payer: Self-pay | Admitting: Internal Medicine

## 2020-09-23 ENCOUNTER — Ambulatory Visit: Payer: Medicare Other | Admitting: Internal Medicine

## 2020-09-23 ENCOUNTER — Other Ambulatory Visit: Payer: Self-pay

## 2020-09-23 VITALS — BP 116/68 | HR 71 | Ht 65.0 in | Wt 169.6 lb

## 2020-09-23 DIAGNOSIS — I441 Atrioventricular block, second degree: Secondary | ICD-10-CM | POA: Diagnosis not present

## 2020-09-23 DIAGNOSIS — Z950 Presence of cardiac pacemaker: Secondary | ICD-10-CM | POA: Diagnosis not present

## 2020-09-23 DIAGNOSIS — I480 Paroxysmal atrial fibrillation: Secondary | ICD-10-CM

## 2020-09-23 DIAGNOSIS — I10 Essential (primary) hypertension: Secondary | ICD-10-CM | POA: Diagnosis not present

## 2020-09-23 NOTE — Patient Instructions (Addendum)
Medication Instructions:  Your physician recommends that you continue on your current medications as directed. Please refer to the Current Medication list given to you today.  Labwork: None ordered.  Testing/Procedures: None ordered.  Follow-Up: Your physician wants you to follow-up in: one year with Lewayne Bunting, MD in [place].   You will receive a reminder letter in the mail two months in advance. If you don't receive a letter, please call our office to schedule the follow-up appointment.  Remote monitoring is used to monitor your Pacemaker from home. This monitoring reduces the number of office visits required to check your device to one time per year. It allows [place] to keep an eye on the functioning of your device to ensure it is working properly. You are scheduled for a device check from home on 12/24/2020. You may send your transmission at any time that day. If you have a wireless device, the transmission will be sent automatically. After your physician reviews your transmission, you will receive a postcard with your next transmission date.  Any Other Special Instructions Will Be Listed Below (If Applicable).  If you need a refill on your cardiac medications before your next appointment, please call your pharmacy.

## 2020-09-23 NOTE — Progress Notes (Signed)
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 She also has a history of paroxysmal atrial fibrillation. She denies chest pain or shortness of breath. She has lost 45 lbs in the interim. She feels well.  Allergies  Allergen Reactions  . Propoxyphene Rash     Current Outpatient Medications  Medication Sig Dispense Refill  . atenolol (TENORMIN) 50 MG tablet Take 25 mg by mouth every morning.     . Cholecalciferol (VITAMIN D3) 1000 units CAPS Take 2,000 Units by mouth every morning.     [name] [name] Calcium (STOOL SOFTENER PO) Take 3 capsules by mouth daily.    Marland Kitchen lisinopril-hydrochlorothiazide (PRINZIDE,ZESTORETIC) 10-12.5 MG tablet Take 1 tablet by mouth every morning.     . metFORMIN (GLUCOPHAGE-XR) 500 MG 24 hr tablet Take 500 mg by mouth 2 (two) times daily.     . nitroGLYCERIN (NITROSTAT) 0.4 MG SL tablet Place 0.4 mg as needed under the tongue for chest pain.    Marland Kitchen omeprazole (PRILOSEC) 20 MG capsule Take 20 mg by mouth every morning.     . rosuvastatin (CRESTOR) 40 MG tablet Take 40 mg by mouth at bedtime.     [name] [name] 20 MG TABS tablet TAKE ONE TABLET (20MG  DOSE) BY MOUTH DAILY 90 tablet 1   No current facility-administered medications for this visit.     Past Medical History:  Diagnosis Date  . Atrial fibrillation (HCC)   . 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 Coronary artery disease   . Diabetes mellitus without complication (HCC)    type 2  . GERD (gastroesophageal reflux disease)   . High cholesterol   . Hypertension   . 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   Past Surgical History:  Procedure Laterality Date  . ABDOMINAL HYSTERECTOMY    . CORONARY ANGIOPLASTY    . EP IMPLANTABLE DEVICE N/A 08/17/2016   Procedure: Pacemaker Implant;  Surgeon: 14/01/2016,  MD;  Location: [facility] INVASIVE CV LAB;  Service: Cardiovascular;  Laterality: N/A;  . FOOT SURGERY    . INSERT / REPLACE / REMOVE PACEMAKER  08/17/2016  . KNEE ARTHROSCOPY    . ROTATOR CUFF REPAIR       Family History  Problem Relation Age of Onset  . Heart disease Mother   . Diabetes Mother   . Heart attack Father   . Cancer - Colon Sister   . Diabetes Sister   . Cancer - Lung Brother   . Cancer - Lung Brother   . Heart disease Son      Social History   Socioeconomic History  . Marital status: Divorced    Spouse name: Not on file  . Number of children: 2  . Years of education: Not on file  . Highest education level: Not on file  Occupational History  . Occupation: retired  Tobacco Use  . Smoking status: Never Smoker  . Smokeless tobacco: Never Used  Vaping Use  . Vaping Use: Never used  Substance and Sexual Activity  . Alcohol use: No  . Drug use: No  . Sexual activity: Not on file  Other Topics Concern  . Not on file  Social History Narrative  . Not on file   Social Determinants of Health   Financial Resource Strain: Not on file  Food Insecurity: Not on file  Transportation Needs: Not on file  Physical Activity: Not on file  Stress: Not on file  Social Connections: Not on file  Intimate Partner Violence: Not on file     BP 116/68   Pulse 71   Ht 5\' 5"  (1.651 m)   Wt 169 lb 9.6 oz (76.9 kg)   SpO2 97%   BMI 28.22 kg/m   Physical Exam:  Well appearing NAD HEENT: Unremarkable Neck:  No JVD, no thyromegally Lymphatics:  No adenopathy Back:  No CVA tenderness Lungs:  Clear with no wheezes HEART:  Regular rate rhythm, no murmurs, no rubs, no clicks Abd:  soft, positive bowel sounds, no organomegally, no rebound, no guarding Ext:  2 plus pulses, no edema, no cyanosis, no clubbing Skin:  No rashes no nodules Neuro:  CN II through XII intact, motor grossly intact  EKG - NSR  DEVICE  Normal device function.  See PaceArt for details.    Assess/Plan: 1. CHB - she is asymptomatic, s/p PPM insertion. No change 2. PPM - her DDD PM is working normally. We will recheck in several months.  3. HTN - her bp is only minimally elevated. I asked her to lose weight and maintain a low sodium diet. 4. Obesity - I am happy for her that she has lost 45 lbs!. She will continue her current plan.   [employer] [name] Engelmann,MD

## 2020-09-24 ENCOUNTER — Ambulatory Visit (INDEPENDENT_AMBULATORY_CARE_PROVIDER_SITE_OTHER): Payer: Medicare Other

## 2020-09-24 DIAGNOSIS — I441 Atrioventricular block, second degree: Secondary | ICD-10-CM | POA: Diagnosis not present

## 2020-09-26 LAB — CUP PACEART REMOTE DEVICE CHECK
Battery Remaining Longevity: 126 mo
Battery Remaining Percentage: 100 %
Brady Statistic RA Percent Paced: 62 %
Brady Statistic RV Percent Paced: 12 %
Date Time Interrogation Session: 20220112100100
Implantable Lead Implant Date: 20171205
Implantable Lead Implant Date: 20171205
Implantable Lead Location: 753859
Implantable Lead Location: 753860
Implantable Lead Model: 7740
Implantable Lead Model: 7741
Implantable Lead Serial Number: 673793
Implantable Lead Serial Number: 819256
Implantable Pulse Generator Implant Date: 20171205
Lead Channel Impedance Value: 666 Ohm
Lead Channel Impedance Value: 735 Ohm
Lead Channel Pacing Threshold Amplitude: 0.7 V
Lead Channel Pacing Threshold Amplitude: 1.3 V
Lead Channel Pacing Threshold Pulse Width: 0.4 ms
Lead Channel Pacing Threshold Pulse Width: 0.4 ms
Lead Channel Setting Pacing Amplitude: 2 V
Lead Channel Setting Pacing Amplitude: 2.5 V
Lead Channel Setting Pacing Pulse Width: 0.4 ms
Lead Channel Setting Sensing Sensitivity: 2.5 mV
Pulse Gen Serial Number: 732442

## 2020-10-08 NOTE — Progress Notes (Signed)
Remote pacemaker transmission.   

## 2020-11-17 LAB — CUP PACEART INCLINIC DEVICE CHECK
Brady Statistic RA Percent Paced: 45 %
Brady Statistic RV Percent Paced: 34 %
Date Time Interrogation Session: 20220111140000
Implantable Lead Implant Date: 20171205
Implantable Lead Implant Date: 20171205
Implantable Lead Location: 753859
Implantable Lead Location: 753860
Implantable Lead Model: 7740
Implantable Lead Model: 7741
Implantable Lead Serial Number: 673793
Implantable Lead Serial Number: 819256
Implantable Pulse Generator Implant Date: 20171205
Lead Channel Pacing Threshold Amplitude: 1 V
Lead Channel Pacing Threshold Amplitude: 1 V
Lead Channel Pacing Threshold Pulse Width: 0.4 ms
Lead Channel Pacing Threshold Pulse Width: 0.4 ms
Lead Channel Sensing Intrinsic Amplitude: 6.3 mV
Lead Channel Sensing Intrinsic Amplitude: 6.9 mV
Pulse Gen Serial Number: 732442

## 2020-11-26 ENCOUNTER — Telehealth: Payer: Self-pay | Admitting: Internal Medicine

## 2020-11-26 NOTE — Telephone Encounter (Signed)
Patient with diagnosis of afib on Xarelto for anticoagulation.    Procedure: Anterior/Posterior Repair for grade 2-3 cystocele   Date of procedure: 12/08/20  CHA2DS2-VASc Score = 5  This indicates a 7.2% annual risk of stroke. The patient's score is based upon: CHF History: No HTN History: Yes Diabetes History: Yes Stroke History: No Vascular Disease History: Yes Age Score: 1 Gender Score: 1  CrCl 36mL/min Platelet count 215K  Per office protocol, patient can hold Xarelto for 2-3 days prior to procedure.

## 2020-11-26 NOTE — Telephone Encounter (Signed)
   [facility] Pre-operative Risk Assessment    HEARTCARE STAFF: - Please ensure there is not already an duplicate clearance open for this procedure. - Under Visit Info/Reason for Call, type in Other and utilize the format Clearance MM/DD/YY or Clearance TBD. Do not use dashes or single digits. - If request is for dental extraction, please clarify the # of teeth to be extracted.  Request for surgical clearance:  1. What type of surgery is being performed? Anterior/Posterior Repair for grade 2-3 cystocele   2. When is this surgery scheduled? 12/08/20   3. What type of clearance is required (medical clearance vs. Pharmacy clearance to hold med vs. Both)? pharmacy  4. Are there any medications that need to be held prior to surgery and how long? Xarelto TBD by Cardiology  5. Practice name and name of physician performing surgery? Dr. Paula Compton, [facility]   6. What is the office phone number? [phone]   7.   What is the office fax number? [phone]   8.   Anesthesia type (None, local, MAC, general) ? General    Johnna Acosta 11/26/2020, 2:49 PM  _________________________________________________________________   (provider comments below)

## 2020-11-26 NOTE — Telephone Encounter (Signed)
   Primary Cardiologist: Lewayne Bunting, MD  Chart reviewed as part of pre-operative protocol coverage.  Per pharmacy recommendations, patient can hold xarelto 2-3 days prior to his upcoming cystocele procedure with plans to restart as soon as she is cleared to do so by her gynecologist  I will route this recommendation to the requesting party via Epic fax function and remove from pre-op pool.  Please call with questions.  Beatriz Stallion, PA-C 11/26/2020, 4:33 PM

## 2020-11-30 NOTE — H&P (Addendum)
[name] [name] is an 75 y.o. female G2P2 who presnts for scheduled Anterior/Posterior repair for grade 2-3 cystocele, possible small rectocele. Pt has a cystocele that has been present for years and just bothersome with increased pelvic pressure over last year. She reports no issues with voiding, no SUI, and no urgency. She is sexually active and has vaginal dryness that is annoying but not painful. TAH in her 30's for endometriosis and rectocele repair in 50's. She is a little constipated and always has been--takes colace, miralax prn. She recently lost 45 lbs. She has CHTN, lipid and diabetes. She has a pacemaker also for a fib and is on xarelto --placed 3 years ago.    Pertinent Gynecological History:  OB History: NSVD x 2   Menstrual History:  No LMP recorded. Patient has had a hysterectomy.    Past Medical History:  Diagnosis Date  . Atrial fibrillation (HCC)   . 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 Coronary artery disease   . Diabetes mellitus without complication (HCC)    type 2  . GERD (gastroesophageal reflux disease)   . High cholesterol   . Hypertension   . Presence of permanent cardiac pacemaker 08/17/2016    Past Surgical History:  Procedure Laterality Date  . ABDOMINAL HYSTERECTOMY    . CORONARY ANGIOPLASTY    . EP IMPLANTABLE DEVICE N/A 08/17/2016   Procedure: Pacemaker Implant;  Surgeon: Marinus Maw, MD;  Location: [facility] INVASIVE CV LAB;  Service: Cardiovascular;  Laterality: N/A;  . FOOT SURGERY    . INSERT / REPLACE / REMOVE PACEMAKER  08/17/2016  . KNEE ARTHROSCOPY    . ROTATOR CUFF REPAIR      Family History  Problem Relation Age of Onset  . Heart disease Mother   . Diabetes Mother   . Heart attack Father   . Cancer - Colon Sister   . Diabetes Sister   . Cancer - Lung Brother   . Cancer - Lung Brother   . Heart disease Son     Social History:  reports that she has never smoked. She has never used smokeless tobacco. She reports that she does not drink  alcohol and does not use drugs.  Allergies:  Allergies  Allergen Reactions  . Propoxyphene Rash    No medications prior to admission.    Review of Systems  Constitutional: Negative for fever.  Gastrointestinal: Negative for abdominal pain.  Genitourinary: Negative for vaginal bleeding.    There were no vitals taken for this visit. Physical Exam Cardiovascular:     Rate and Rhythm: Normal rate and regular rhythm.  Pulmonary:     Effort: Pulmonary effort is normal.  Abdominal:     Palpations: Abdomen is soft.  Genitourinary:    General: Normal vulva.     Vagina: Prolapsed vaginal walls present.     Uterus: Absent.      Adnexa: Left adnexa normal.     Rectum: Normal.     Comments: Grade 2-3 cystocele, possible small rectocele Atrophic change Neurological:     Mental Status: She is alert.  Psychiatric:        Mood and Affect: Mood normal.     No results found for this or any previous visit (from the past 24 hour(s)).  No results found.  Assessment/Plan: The patient was counseled regarding the risks of anterior and possible posterior repair. The procedure was reviewed in detail and expectations regarding recovery.  Risks of bleeding, infection and possible damage to bowel  and bladder were reviewed.  The patient understands that should a complication arise she would might need an abdominal incision and that this would delay her recovery.  She would accept a blood transfusion if needed. Her cardiologist has given clearance to come off her xarelto 2-3 days before procedure and is ok to be off until 3 days after.   She is ready to proceed.  Oliver Pila 11/30/2020, 9:13 AM

## 2020-12-03 ENCOUNTER — Encounter: Payer: Self-pay | Admitting: Internal Medicine

## 2020-12-03 ENCOUNTER — Other Ambulatory Visit: Payer: Self-pay

## 2020-12-03 ENCOUNTER — Encounter (HOSPITAL_BASED_OUTPATIENT_CLINIC_OR_DEPARTMENT_OTHER): Payer: Self-pay | Admitting: Obstetrics and Gynecology

## 2020-12-03 NOTE — Progress Notes (Signed)
Spoke w/ via phone for pre-op interview---pt Lab needs dos---- none              Lab results------see below COVID test ------12-04-2020 1145 Arrive at -------530 am 12-08-2020 NPO after MN NO Solid Food.  Clear liquids from MN until--- Med rec completed Medications to take morning of surgery -----atenolol omeprazole Diabetic medication -----none day of surgery Patient instructed to bring photo id and insurance card day of surgery Patient aware to have Driver (ride ) / caregiver daughter in law April butler will stay for surgery    for 24 hours after surgery  Patient Special Instructions -----pt given overnight stay instructions Pre-Op special Istructions -----none Patient verbalized understanding of instructions that were given at this phone interview. Patient denies shortness of breath, chest pain, fever, cough at this phone interview.  Anesthesia Review: pacekmaker, htn angioplasty with stent x 1, dm type 2, pt denies any cardiac s & s and states has never used nitro.  Device orders received from dr taylor and placed on chart and are in epic  PCP: dr Casimiro Needle bagder [facility] Cardiologist : dr Marvel Plan 09-23-2020 epic, cardiac clearance note krista kroeger pa 11-26-2020 on chart/epic Chest x-ray :08-18-2016 epic EKG : 09-23-2020 chart/epic Echo :08-17-2016 epic Stress test:none Cardiac Cath : 14 yrs ago per pt Activity level: cleans house and climbs stairs without problems Sleep Study/ CPAP :none Fasting Blood Sugar :      / Checks Blood Sugar -- times a day:  Dm type 2 does not check cbg Blood Thinner/ Instructions /Last Dose:note to stop xarelto 2-3 days before surgery 31-02-2021 krista kroeger on chart, patient aware ASA / Instructions/ Last Dose :

## 2020-12-03 NOTE — Progress Notes (Signed)
PERIOPERATIVE PRESCRIPTION FOR IMPLANTED CARDIAC DEVICE PROGRAMMING  Patient Information: Name:  [name] [name]  DOB:  11/26/1945  MRN:  [id number]    Planned Procedure: ANTERIOR AND POSTERIOR REPAIR  Surgeon: DR Huel Cote  Date of Procedure: 12-08-2020  Cautery will be used.  Position during surgery:    Please send documentation back to:  [facility] (Fax # [phone])   Device Information:  Clinic EP Physician:  Lewayne Bunting, MD   Device Type:  Pacemaker Manufacturer and Phone #:  [employer]: [phone] Pacemaker Dependent?:  No. Date of Last Device Check: 10/26/20 Normal Device Function?:  Yes.    Electrophysiologist's Recommendations:   Have magnet available.  Provide continuous ECG monitoring when magnet is used or reprogramming is to be performed.   Procedure should not interfere with device function.  No device programming or magnet placement needed.  Per Device Clinic Standing Orders, [name] [place], [place]  2:44 PM 12/03/2020

## 2020-12-03 NOTE — Progress Notes (Signed)
Erroneous encouner

## 2020-12-04 ENCOUNTER — Encounter (HOSPITAL_COMMUNITY)
Admission: RE | Admit: 2020-12-04 | Discharge: 2020-12-04 | Disposition: A | Discharge status: Home / Self Care | Payer: Medicare Other | Source: Ambulatory Visit | Attending: Obstetrics and Gynecology | Admitting: Obstetrics and Gynecology

## 2020-12-04 ENCOUNTER — Other Ambulatory Visit (HOSPITAL_COMMUNITY)
Admission: RE | Admit: 2020-12-04 | Discharge: 2020-12-04 | Disposition: A | Discharge status: Home / Self Care | Payer: Medicare Other | Source: Ambulatory Visit | Attending: Obstetrics and Gynecology | Admitting: Obstetrics and Gynecology

## 2020-12-04 DIAGNOSIS — Z01812 Encounter for preprocedural laboratory examination: Secondary | ICD-10-CM | POA: Insufficient documentation

## 2020-12-04 DIAGNOSIS — Z20822 Contact with and (suspected) exposure to covid-19: Secondary | ICD-10-CM | POA: Diagnosis not present

## 2020-12-04 LAB — CBC
HCT: 43.7 % (ref 36.0–46.0)
Hemoglobin: 13.8 g/dL (ref 12.0–15.0)
MCH: 27.5 pg (ref 26.0–34.0)
MCHC: 31.6 g/dL (ref 30.0–36.0)
MCV: 87.2 fL (ref 80.0–100.0)
Platelets: 228 10*3/uL (ref 150–400)
RBC: 5.01 MIL/uL (ref 3.87–5.11)
RDW: 13.8 % (ref 11.5–15.5)
WBC: 6.2 10*3/uL (ref 4.0–10.5)
nRBC: 0 % (ref 0.0–0.2)

## 2020-12-04 LAB — BASIC METABOLIC PANEL
Anion gap: 9 (ref 5–15)
BUN: 14 mg/dL (ref 8–23)
CO2: 26 mmol/L (ref 22–32)
Calcium: 9.1 mg/dL (ref 8.9–10.3)
Chloride: 102 mmol/L (ref 98–111)
Creatinine, Ser: 1.17 mg/dL — ABNORMAL HIGH (ref 0.44–1.00)
GFR, Estimated: 49 mL/min — ABNORMAL LOW (ref >60)
Glucose, Bld: 171 mg/dL — ABNORMAL HIGH (ref 70–99)
Potassium: 4.6 mmol/L (ref 3.5–5.1)
Sodium: 137 mmol/L (ref 135–145)

## 2020-12-04 LAB — SARS CORONAVIRUS 2 (TAT 6-24 HRS): SARS Coronavirus 2: NEGATIVE

## 2020-12-06 NOTE — Anesthesia Preprocedure Evaluation (Addendum)
Anesthesia Evaluation  Patient identified by MRN, date of birth, ID band  Reviewed: Allergy & Precautions, Patient's Chart, lab work & pertinent test results  Airway Mallampati: II  TM Distance: >3 FB     Dental   Pulmonary    breath sounds clear to auscultation       Cardiovascular hypertension, Pt. on medications and Pt. on home beta blockers + CAD  + dysrhythmias Atrial Fibrillation + pacemaker  Rhythm:Regular Rate:Normal     Neuro/Psych  Headaches, negative psych ROS   GI/Hepatic Neg liver ROS, GERD  ,  Endo/Other  diabetes  Renal/GU negative Renal ROSK+ 4.6     Musculoskeletal negative musculoskeletal ROS (+)   Abdominal   Peds  Hematology Hgb 13.0   Anesthesia Other Findings   Reproductive/Obstetrics                           Anesthesia Physical Anesthesia Plan  ASA: III  Anesthesia Plan: General   Post-op Pain Management:    Induction: Intravenous  PONV Risk Score and Plan: Ondansetron, Dexamethasone and Midazolam  Airway Management Planned: Oral ETT  Additional Equipment:   Intra-op Plan:   Post-operative Plan: Extubation in OR  Informed Consent: I have reviewed the patients History and Physical, chart, labs and discussed the procedure including the risks, benefits and alternatives for the proposed anesthesia with the patient or authorized representative who has indicated his/her understanding and acceptance.       Plan Discussed with: CRNA and Anesthesiologist  Anesthesia Plan Comments:        Anesthesia Quick Evaluation

## 2020-12-08 ENCOUNTER — Ambulatory Visit (HOSPITAL_BASED_OUTPATIENT_CLINIC_OR_DEPARTMENT_OTHER): Payer: Medicare Other | Admitting: Anesthesiology

## 2020-12-08 ENCOUNTER — Encounter (HOSPITAL_BASED_OUTPATIENT_CLINIC_OR_DEPARTMENT_OTHER): Payer: Self-pay | Admitting: Obstetrics and Gynecology

## 2020-12-08 ENCOUNTER — Other Ambulatory Visit: Payer: Self-pay

## 2020-12-08 ENCOUNTER — Encounter (HOSPITAL_BASED_OUTPATIENT_CLINIC_OR_DEPARTMENT_OTHER)
Admission: RE | Disposition: A | Discharge status: Home / Self Care | Payer: Self-pay | Source: Home / Self Care | Attending: Obstetrics and Gynecology

## 2020-12-08 ENCOUNTER — Ambulatory Visit (HOSPITAL_BASED_OUTPATIENT_CLINIC_OR_DEPARTMENT_OTHER)
Admission: RE | Admit: 2020-12-08 | Discharge: 2020-12-08 | Disposition: A | Discharge status: Home / Self Care | Payer: Medicare Other | Attending: Obstetrics and Gynecology | Admitting: Obstetrics and Gynecology

## 2020-12-08 DIAGNOSIS — Z801 Family history of malignant neoplasm of trachea, bronchus and lung: Secondary | ICD-10-CM | POA: Diagnosis not present

## 2020-12-08 DIAGNOSIS — N816 Rectocele: Secondary | ICD-10-CM | POA: Diagnosis not present

## 2020-12-08 DIAGNOSIS — Z833 Family history of diabetes mellitus: Secondary | ICD-10-CM | POA: Diagnosis not present

## 2020-12-08 DIAGNOSIS — Z950 Presence of cardiac pacemaker: Secondary | ICD-10-CM | POA: Diagnosis not present

## 2020-12-08 DIAGNOSIS — E119 Type 2 diabetes mellitus without complications: Secondary | ICD-10-CM | POA: Diagnosis not present

## 2020-12-08 DIAGNOSIS — Z8249 Family history of ischemic heart disease and other diseases of the circulatory system: Secondary | ICD-10-CM | POA: Insufficient documentation

## 2020-12-08 DIAGNOSIS — I10 Essential (primary) hypertension: Secondary | ICD-10-CM | POA: Diagnosis not present

## 2020-12-08 DIAGNOSIS — I4891 Unspecified atrial fibrillation: Secondary | ICD-10-CM | POA: Insufficient documentation

## 2020-12-08 DIAGNOSIS — Z885 Allergy status to narcotic agent status: Secondary | ICD-10-CM | POA: Insufficient documentation

## 2020-12-08 DIAGNOSIS — N819 Female genital prolapse, unspecified: Secondary | ICD-10-CM | POA: Insufficient documentation

## 2020-12-08 DIAGNOSIS — Z7901 Long term (current) use of anticoagulants: Secondary | ICD-10-CM | POA: Insufficient documentation

## 2020-12-08 DIAGNOSIS — N8110 Cystocele, unspecified: Secondary | ICD-10-CM | POA: Diagnosis not present

## 2020-12-08 DIAGNOSIS — Z800 Family history of malignant neoplasm of digestive organs: Secondary | ICD-10-CM | POA: Insufficient documentation

## 2020-12-08 HISTORY — DX: Rectocele: N81.6

## 2020-12-08 HISTORY — DX: Uterovaginal prolapse, unspecified: N81.4

## 2020-12-08 HISTORY — DX: Headache, unspecified: R51.9

## 2020-12-08 HISTORY — DX: Presence of spectacles and contact lenses: Z97.3

## 2020-12-08 HISTORY — DX: Cystocele, unspecified: N81.10

## 2020-12-08 HISTORY — PX: CYSTOCELE REPAIR: SHX163

## 2020-12-08 HISTORY — DX: Constipation, unspecified: K59.00

## 2020-12-08 LAB — TYPE AND SCREEN
ABO/RH(D): A POS
Antibody Screen: NEGATIVE

## 2020-12-08 LAB — ABO/RH: ABO/RH(D): A POS

## 2020-12-08 LAB — GLUCOSE, CAPILLARY
Glucose-Capillary: 118 mg/dL — ABNORMAL HIGH (ref 70–99)
Glucose-Capillary: 135 mg/dL — ABNORMAL HIGH (ref 70–99)

## 2020-12-08 SURGERY — COLPORRHAPHY, ANTERIOR, FOR CYSTOCELE REPAIR
Anesthesia: General | Site: Vagina

## 2020-12-08 MED ORDER — ATENOLOL 25 MG PO TABS: 25.0000 mg | ORAL_TABLET | Freq: Every morning | ORAL | Status: DC

## 2020-12-08 MED ORDER — ONDANSETRON HCL 4 MG/2ML IJ SOLN
4.0000 mg | Freq: Once | INTRAMUSCULAR | Status: AC | PRN
  Administered 2020-12-08: 4 mg via INTRAVENOUS

## 2020-12-08 MED ORDER — DOCUSATE SODIUM 100 MG PO CAPS
ORAL_CAPSULE | ORAL | Status: AC
  Filled 2020-12-08: qty 1

## 2020-12-08 MED ORDER — FENTANYL CITRATE (PF) 100 MCG/2ML IJ SOLN: 25.0000 ug | INTRAMUSCULAR | Status: DC | PRN

## 2020-12-08 MED ORDER — LACTATED RINGERS IV SOLN: INTRAVENOUS | Status: DC

## 2020-12-08 MED ORDER — SIMETHICONE 80 MG PO CHEW
80.0000 mg | CHEWABLE_TABLET | Freq: Four times a day (QID) | ORAL | Status: DC | PRN
Start: 2020-12-08 — End: 2020-12-09

## 2020-12-08 MED ORDER — MENTHOL 3 MG MT LOZG: 1.0000 | LOZENGE | OROMUCOSAL | Status: DC | PRN

## 2020-12-08 MED ORDER — OXYCODONE HCL 5 MG PO TABS
5.0000 mg | ORAL_TABLET | ORAL | Status: DC | PRN
Start: 2020-12-08 — End: 2020-12-09
  Administered 2020-12-08: 5 mg via ORAL

## 2020-12-08 MED ORDER — IBUPROFEN 600 MG PO TABS: 600.0000 mg | ORAL_TABLET | Freq: Four times a day (QID) | ORAL | 0 refills | Status: DC | PRN

## 2020-12-08 MED ORDER — ESTRADIOL 0.1 MG/GM VA CREA
TOPICAL_CREAM | VAGINAL | Status: DC | PRN
  Administered 2020-12-08: 1 via VAGINAL

## 2020-12-08 MED ORDER — DOCUSATE SODIUM 100 MG PO CAPS
100.0000 mg | ORAL_CAPSULE | Freq: Two times a day (BID) | ORAL | Status: DC
  Administered 2020-12-08: 100 mg via ORAL

## 2020-12-08 MED ORDER — NITROGLYCERIN 0.4 MG SL SUBL: 0.4000 mg | SUBLINGUAL_TABLET | SUBLINGUAL | Status: DC | PRN

## 2020-12-08 MED ORDER — BUPIVACAINE HCL (PF) 0.5 % IJ SOLN
INTRAMUSCULAR | Status: DC | PRN
  Administered 2020-12-08: 4 mL

## 2020-12-08 MED ORDER — PANTOPRAZOLE SODIUM 40 MG PO TBEC
DELAYED_RELEASE_TABLET | ORAL | Status: AC
  Filled 2020-12-08: qty 1

## 2020-12-08 MED ORDER — METFORMIN HCL ER 500 MG PO TB24
500.0000 mg | ORAL_TABLET | Freq: Two times a day (BID) | ORAL | Status: DC
Start: 2020-12-08 — End: 2020-12-09
  Administered 2020-12-08: 500 mg via ORAL
  Filled 2020-12-08: qty 1

## 2020-12-08 MED ORDER — PHENYLEPHRINE 40 MCG/ML (10ML) SYRINGE FOR IV PUSH (FOR BLOOD PRESSURE SUPPORT)
PREFILLED_SYRINGE | INTRAVENOUS | Status: DC | PRN
  Administered 2020-12-08 (×2): 120 ug via INTRAVENOUS

## 2020-12-08 MED ORDER — ACETAMINOPHEN 10 MG/ML IV SOLN: 1000.0000 mg | Freq: Once | INTRAVENOUS | Status: DC | PRN

## 2020-12-08 MED ORDER — ESTRADIOL 0.1 MG/GM VA CREA
TOPICAL_CREAM | VAGINAL | Status: AC
  Filled 2020-12-08: qty 42.5

## 2020-12-08 MED ORDER — PROPOFOL 10 MG/ML IV BOLUS
INTRAVENOUS | Status: DC | PRN
  Administered 2020-12-08: 130 mg via INTRAVENOUS

## 2020-12-08 MED ORDER — DEXAMETHASONE SODIUM PHOSPHATE 10 MG/ML IJ SOLN
INTRAMUSCULAR | Status: DC | PRN
  Administered 2020-12-08: 4 mg via INTRAVENOUS

## 2020-12-08 MED ORDER — FENTANYL CITRATE (PF) 100 MCG/2ML IJ SOLN
INTRAMUSCULAR | Status: AC
  Filled 2020-12-08: qty 2

## 2020-12-08 MED ORDER — ONDANSETRON HCL 4 MG/2ML IJ SOLN: 4.0000 mg | Freq: Four times a day (QID) | INTRAMUSCULAR | Status: DC | PRN

## 2020-12-08 MED ORDER — BUPIVACAINE HCL (PF) 0.5 % IJ SOLN
INTRAMUSCULAR | Status: AC
  Filled 2020-12-08: qty 30

## 2020-12-08 MED ORDER — IBUPROFEN 200 MG PO TABS: 600.0000 mg | ORAL_TABLET | Freq: Four times a day (QID) | ORAL | Status: DC | PRN

## 2020-12-08 MED ORDER — ACETAMINOPHEN 500 MG PO TABS: 1000.0000 mg | ORAL_TABLET | Freq: Four times a day (QID) | ORAL | 0 refills | Status: DC

## 2020-12-08 MED ORDER — PROPOFOL 10 MG/ML IV BOLUS
INTRAVENOUS | Status: AC
  Filled 2020-12-08: qty 20

## 2020-12-08 MED ORDER — POVIDONE-IODINE 10 % EX SWAB: 2.0000 "application " | Freq: Once | CUTANEOUS | Status: DC

## 2020-12-08 MED ORDER — PANTOPRAZOLE SODIUM 40 MG PO TBEC
40.0000 mg | DELAYED_RELEASE_TABLET | Freq: Every day | ORAL | Status: DC
  Administered 2020-12-08: 40 mg via ORAL

## 2020-12-08 MED ORDER — LIDOCAINE 2% (20 MG/ML) 5 ML SYRINGE
INTRAMUSCULAR | Status: AC
  Filled 2020-12-08: qty 5

## 2020-12-08 MED ORDER — ONDANSETRON HCL 4 MG/2ML IJ SOLN
INTRAMUSCULAR | Status: AC
  Filled 2020-12-08: qty 2

## 2020-12-08 MED ORDER — ACETAMINOPHEN 10 MG/ML IV SOLN
INTRAVENOUS | Status: DC | PRN
  Administered 2020-12-08: 1000 mg via INTRAVENOUS

## 2020-12-08 MED ORDER — ROSUVASTATIN CALCIUM 40 MG PO TABS: 40.0000 mg | ORAL_TABLET | Freq: Every day | ORAL | Status: DC

## 2020-12-08 MED ORDER — ONDANSETRON HCL 4 MG/2ML IJ SOLN
INTRAMUSCULAR | Status: DC | PRN
  Administered 2020-12-08: 4 mg via INTRAVENOUS

## 2020-12-08 MED ORDER — LISINOPRIL-HYDROCHLOROTHIAZIDE 10-12.5 MG PO TABS: 1.0000 | ORAL_TABLET | Freq: Every morning | ORAL | Status: DC

## 2020-12-08 MED ORDER — ROCURONIUM BROMIDE 10 MG/ML (PF) SYRINGE
PREFILLED_SYRINGE | INTRAVENOUS | Status: AC
  Filled 2020-12-08: qty 10

## 2020-12-08 MED ORDER — PHENYLEPHRINE 40 MCG/ML (10ML) SYRINGE FOR IV PUSH (FOR BLOOD PRESSURE SUPPORT)
PREFILLED_SYRINGE | INTRAVENOUS | Status: AC
  Filled 2020-12-08: qty 10

## 2020-12-08 MED ORDER — DEXAMETHASONE SODIUM PHOSPHATE 10 MG/ML IJ SOLN
INTRAMUSCULAR | Status: AC
  Filled 2020-12-08: qty 1

## 2020-12-08 MED ORDER — ACETAMINOPHEN 500 MG PO TABS
1000.0000 mg | ORAL_TABLET | Freq: Four times a day (QID) | ORAL | Status: DC
  Administered 2020-12-08: 1000 mg via ORAL

## 2020-12-08 MED ORDER — ONDANSETRON HCL 4 MG PO TABS: 4.0000 mg | ORAL_TABLET | Freq: Four times a day (QID) | ORAL | Status: DC | PRN

## 2020-12-08 MED ORDER — ACETAMINOPHEN 10 MG/ML IV SOLN
INTRAVENOUS | Status: AC
  Filled 2020-12-08: qty 100

## 2020-12-08 MED ORDER — VASOPRESSIN 20 UNIT/ML IV SOLN
INTRAVENOUS | Status: AC
  Filled 2020-12-08: qty 1

## 2020-12-08 MED ORDER — VASOPRESSIN 20 UNIT/ML IV SOLN
INTRAVENOUS | Status: DC | PRN
  Administered 2020-12-08: 12 mL via INTRAMUSCULAR

## 2020-12-08 MED ORDER — MAGNESIUM HYDROXIDE 400 MG/5ML PO SUSP: 30.0000 mL | Freq: Every day | ORAL | Status: DC | PRN

## 2020-12-08 MED ORDER — LIDOCAINE 2% (20 MG/ML) 5 ML SYRINGE
INTRAMUSCULAR | Status: DC | PRN
  Administered 2020-12-08: 50 mg via INTRAVENOUS

## 2020-12-08 MED ORDER — ACETAMINOPHEN 500 MG PO TABS
ORAL_TABLET | ORAL | Status: AC
  Filled 2020-12-08: qty 2

## 2020-12-08 MED ORDER — FENTANYL CITRATE (PF) 100 MCG/2ML IJ SOLN
INTRAMUSCULAR | Status: DC | PRN
  Administered 2020-12-08 (×2): 25 ug via INTRAVENOUS
  Administered 2020-12-08: 50 ug via INTRAVENOUS

## 2020-12-08 MED ORDER — OXYCODONE HCL 5 MG PO TABS
ORAL_TABLET | ORAL | Status: AC
  Filled 2020-12-08: qty 1

## 2020-12-08 SURGICAL SUPPLY — 25 items
BLADE SURG 15 STRL LF DISP TIS (BLADE) IMPLANT
BLADE SURG 15 STRL SS (BLADE)
COVER WAND RF STERILE (DRAPES) ×2 IMPLANT
DECANTER SPIKE VIAL GLASS SM (MISCELLANEOUS) IMPLANT
GAUZE 4X4 16PLY RFD (DISPOSABLE) ×2 IMPLANT
GAUZE PACKING 2X5 YD STRL (GAUZE/BANDAGES/DRESSINGS) ×2 IMPLANT
GLOVE SURG ENC MOIS LTX SZ7 (GLOVE) ×2 IMPLANT
GLOVE SURG UNDER POLY LF SZ7.5 (GLOVE) ×2 IMPLANT
GOWN STRL REUS W/TWL LRG LVL3 (GOWN DISPOSABLE) ×12 IMPLANT
KIT TURNOVER CYSTO (KITS) ×2 IMPLANT
NEEDLE HYPO 22GX1.5 SAFETY (NEEDLE) IMPLANT
NEEDLE MAYO CATGUT SZ4 (NEEDLE) IMPLANT
NEEDLE SPNL 22GX3.5 QUINCKE BK (NEEDLE) ×2 IMPLANT
NS IRRIG 1000ML POUR BTL (IV SOLUTION) ×2 IMPLANT
PACK VAGINAL WOMENS (CUSTOM PROCEDURE TRAY) ×2 IMPLANT
SUT PROLENE 1 CT 1 30 (SUTURE) IMPLANT
SUT VIC AB 0 CT1 18XCR BRD8 (SUTURE) ×1 IMPLANT
SUT VIC AB 0 CT1 27 (SUTURE)
SUT VIC AB 0 CT1 27XBRD ANBCTR (SUTURE) IMPLANT
SUT VIC AB 0 CT1 8-18 (SUTURE) ×2
SUT VIC AB 2-0 CT1 27 (SUTURE) ×4
SUT VIC AB 2-0 CT1 TAPERPNT 27 (SUTURE) ×2 IMPLANT
SUT VIC AB 2-0 UR5 27 (SUTURE) IMPLANT
TOWEL OR 17X26 10 PK STRL BLUE (TOWEL DISPOSABLE) ×4 IMPLANT
TRAY FOLEY W/BAG SLVR 14FR LF (SET/KITS/TRAYS/PACK) ×2 IMPLANT

## 2020-12-08 NOTE — Discharge Summary (Signed)
Physician Discharge Summary  Patient ID: SHANIE MAUZY MRN: [id number] DOB/AGE: [birth year] 75 y.o.  Admit date: 12/08/2020 Discharge date: 12/08/2020  Admission Diagnoses: Pelvic prolapse  Discharge Diagnoses:  Active Problems:   Pelvic prolapse s/p anterior and posterior repair  Discharged Condition: good  Hospital Course: Pt was admitted to extended observation post-surgery and did well.  By the evening of post-operative Day #0 she was ambulating, tolerating po meds and voiding without problem.  She was d/c home with instructions and prescriptions.  She will resume her xarelto the night of 12/09/20.  Treatments: surgery: Anterior and posterior repair  Discharge Exam: Blood pressure (!) 118/49, pulse 65, temperature 98.3 F (36.8 C), resp. rate 17, height 5\' 5"  (1.651 m), weight 79.5 kg, SpO2 95 %. General appearance: alert and cooperative GI: soft NT Minimal vaginal bleeding and packing removed  Disposition: Discharge disposition: 01-Home or Self Care       Discharge Instructions    Diet - low sodium heart healthy   Complete by: As directed    Discharge instructions   Complete by: As directed    Nothing in vagina for 6 weeks.  No sex, tampons or douching.  May shower or take a bath with no bubblebath or soap in water.  No heavy lifting for 6 weeks.  Restart your xarelto tomorrow night 12/09/20. Call with heavy bleeding or fever.     Allergies as of 12/08/2020      Reactions   Propoxyphene Rash   darvocet      Medication List    TAKE these medications   acetaminophen 500 MG tablet Commonly known as: TYLENOL Take 2 tablets (1,000 mg total) by mouth every 6 (six) hours.   atenolol 50 MG tablet Commonly known as: TENORMIN Take 25 mg by mouth every morning.   ibuprofen 600 MG tablet Commonly known as: ADVIL Take 1 tablet (600 mg total) by mouth every 6 (six) hours as needed for moderate pain.   lisinopril-hydrochlorothiazide 10-12.5 MG tablet Commonly  known as: ZESTORETIC Take 1 tablet by mouth every morning.   metFORMIN 500 MG 24 hr tablet Commonly known as: GLUCOPHAGE-XR Take 500 mg by mouth 2 (two) times daily.   nitroGLYCERIN 0.4 MG SL tablet Commonly known as: NITROSTAT Place 0.4 mg as needed under the tongue for chest pain.   omeprazole 20 MG capsule Commonly known as: PRILOSEC Take 20 mg by mouth every morning.   rosuvastatin 40 MG tablet Commonly known as: CRESTOR Take 40 mg by mouth at bedtime.   STOOL SOFTENER PO Take 3 capsules by mouth daily.   Vitamin D3 25 MCG (1000 UT) Caps Take 2,000 Units by mouth every morning.   Xarelto 20 MG Tabs tablet Generic drug: rivaroxaban TAKE ONE TABLET (20MG  DOSE) BY MOUTH DAILY What changed: See the new instructions.       Follow-up Information    12/10/2020, MD. Schedule an appointment as soon as possible for a visit in 6 week(s).   Specialty: Obstetrics and Gynecology Why: post op check Contact information: [address] [place] [address] [place] [phone]               Signed: [id number] 12/08/2020, 7:25 PM

## 2020-12-08 NOTE — Op Note (Signed)
Operative Note    Preoperative Diagnosis Pelvic prolapse with cystocele and rectocele  Postoperative Diagnosis Same  Procedure Anterior and posterior repair  Surgeon Huel Cote, MD Ellison Hughs, MD  Anesthesia LMA  Fluids: EBL 50mL UOP Voided prior to procedure and catheter placed at end with clear urine noted IVF  Findings Pt had a grade 2-3 cystocele and recurrent rectocele, but primarily distal on the posterior vaginal wall, no enterocele noted.  Specimen none   Procedure Note Patient was taken to the operating room where LMA aneshthesia  was obtained without difficulty. She was then prepped and draped in the normal sterile fashion in the dorsal lithotomy position. An appropriate timeout was performed.  Attention was then turned to the vagina and the cuff was grasped with Allys clamps x 2 and injected with a dilute solution of Pitressin at the midline of the anterior vaginal wall.  An incision was then started and the vaginal mucosa was underscored and dissected off the pubo-vesicle fascia with the cystocele freed from the mucosa.  The fascia was then re-approximated with 0 vicryl interrupted sutures and the excess mucosa trimmed away.  The mucosa and the vaginal cuff were then closed with 2-0 vicryl in a running fashion.  Attention was then turned posteriorly where the introitus was denuded and underscored in the midline with mayo scissors.  The vaginal mucosa was then dissected off the underlying puborectal fascia and reflected away from the midline.  The rectocele was noted to be primarily in the last two thirds of the posterior wall, but no enterocele noted. It was then reduced and the puborectal fascia re-approximated with 0 vicryl interrupted sutures. The excess mucosa was then trimmed away and then closed with 2-0 vicryl in a running locked suture.  All appeared hemostatic and estrace coated vaginal packing placed.  Sponge, instrument and needle counts were  correct.    Patient was then awakened and taken to the recovery room in good condition.

## 2020-12-08 NOTE — Interval H&P Note (Signed)
History and Physical Interval Note:  12/08/2020 7:03 AM  [name] [name]  has presented today for surgery, with the diagnosis of cystocele.  The various methods of treatment have been discussed with the patient and family. After consideration of risks, benefits and other options for treatment, the patient has consented to  Procedure(s): ANTERIOR REPAIR (CYSTOCELE) (N/A) as a surgical intervention.  The patient's history has been reviewed, patient examined, no change in status, stable for surgery.  I have reviewed the patient's chart and labs.  Questions were answered to the patient's satisfaction.  She last took her Eliquis on 12/06/20 in the evening and has been off since in preparation for the surgery.   Oliver Pila

## 2020-12-08 NOTE — Transfer of Care (Signed)
Immediate Anesthesia Transfer of Care Note  Patient: [name] [name]  Procedure(s) Performed: Procedure(s) (LRB): ANTERIOR REPAIR (CYSTOCELE) (N/A)  Patient Location: PACU  Anesthesia Type: General  Level of Consciousness: awake, alert  and oriented  Airway & Oxygen Therapy: Patient Spontanous Breathing and Patient connected to nasal cannula oxygen  Post-op Assessment: Report given to PACU RN and Post -op Vital signs reviewed and stable  Post vital signs: Reviewed and stable  Complications: No apparent anesthesia complications Last Vitals:  Vitals Value Taken Time  BP 117/58 12/08/20 0908  Temp    Pulse 59 12/08/20 0911  Resp 16 12/08/20 0911  SpO2 94 % 12/08/20 0911  Vitals shown include unvalidated device data.  Last Pain:  Vitals:   12/08/20 0544  TempSrc: Oral         Complications: No complications documented.

## 2020-12-08 NOTE — Progress Notes (Signed)
Day of Surgery Procedure(s) (LRB): ANTERIOR REPAIR (CYSTOCELE) (N/A)  Subjective: Patient reports tolerating PO and no problems voiding.  She is doing very well and has ambulated the halls multiple times,voided twice, and pain is controlled on tylenol.  She would like to go home  Objective: I have reviewed patient's vital signs and intake and output.  General: alert and cooperative GI: soft NT Vaginal Bleeding: minimal--vaginal packing removed with scant blood on packing  Assessment: s/p Procedure(s): ANTERIOR REPAIR (CYSTOCELE) (N/A): progressing well  Plan: Discharge home  LOS: 0 days    Oliver Pila 12/08/2020, 7:19 PM

## 2020-12-08 NOTE — Anesthesia Postprocedure Evaluation (Signed)
Anesthesia Post Note  Patient: [name] [name]  Procedure(s) Performed: ANTERIOR REPAIR (CYSTOCELE) (N/A Vagina )     Patient location during evaluation: PACU Anesthesia Type: General Level of consciousness: awake Pain management: pain level controlled Vital Signs Assessment: post-procedure vital signs reviewed and stable Respiratory status: spontaneous breathing Cardiovascular status: stable Postop Assessment: no apparent nausea or vomiting Anesthetic complications: no   No complications documented.  Last Vitals:  Vitals:   12/08/20 1036 12/08/20 1130  BP: (!) 142/55 (!) 121/52  Pulse: 60 60  Resp: 15 16  Temp: 36.7 C 36.8 C  SpO2: 95% 96%    Last Pain:  Vitals:   12/08/20 1130  TempSrc:   PainSc: 2                  [name]

## 2020-12-08 NOTE — Anesthesia Procedure Notes (Signed)
Procedure Name: LMA Insertion Date/Time: 12/08/2020 7:35 AM Performed by: Norva Pavlov, CRNA Pre-anesthesia Checklist: Patient identified, Emergency Drugs available, Suction available and Patient being monitored Patient Re-evaluated:Patient Re-evaluated prior to induction Oxygen Delivery Method: Circle system utilized Preoxygenation: Pre-oxygenation with 100% oxygen Induction Type: IV induction Ventilation: Mask ventilation without difficulty LMA: LMA inserted LMA Size: 4.0 Number of attempts: 1 Airway Equipment and Method: Bite block Placement Confirmation: positive ETCO2 Tube secured with: Tape Dental Injury: Teeth and Oropharynx as per pre-operative assessment

## 2020-12-09 ENCOUNTER — Encounter (HOSPITAL_BASED_OUTPATIENT_CLINIC_OR_DEPARTMENT_OTHER): Payer: Self-pay | Admitting: Obstetrics and Gynecology

## 2020-12-24 ENCOUNTER — Ambulatory Visit (INDEPENDENT_AMBULATORY_CARE_PROVIDER_SITE_OTHER): Payer: Medicare Other

## 2020-12-24 DIAGNOSIS — I441 Atrioventricular block, second degree: Secondary | ICD-10-CM | POA: Diagnosis not present

## 2020-12-26 LAB — CUP PACEART REMOTE DEVICE CHECK
Battery Remaining Longevity: 126 mo
Battery Remaining Percentage: 100 %
Brady Statistic RA Percent Paced: 58 %
Brady Statistic RV Percent Paced: 17 %
Date Time Interrogation Session: 20220413020100
Implantable Lead Implant Date: 20171205
Implantable Lead Implant Date: 20171205
Implantable Lead Location: 753859
Implantable Lead Location: 753860
Implantable Lead Model: 7740
Implantable Lead Model: 7741
Implantable Lead Serial Number: 673793
Implantable Lead Serial Number: 819256
Implantable Pulse Generator Implant Date: 20171205
Lead Channel Impedance Value: 632 Ohm
Lead Channel Impedance Value: 682 Ohm
Lead Channel Pacing Threshold Amplitude: 0.5 V
Lead Channel Pacing Threshold Amplitude: 1 V
Lead Channel Pacing Threshold Pulse Width: 0.4 ms
Lead Channel Pacing Threshold Pulse Width: 0.4 ms
Lead Channel Setting Pacing Amplitude: 2 V
Lead Channel Setting Pacing Amplitude: 2.5 V
Lead Channel Setting Pacing Pulse Width: 0.4 ms
Lead Channel Setting Sensing Sensitivity: 2.5 mV
Pulse Gen Serial Number: 732442

## 2021-01-07 NOTE — Progress Notes (Signed)
Remote pacemaker transmission.   

## 2021-03-25 ENCOUNTER — Ambulatory Visit (INDEPENDENT_AMBULATORY_CARE_PROVIDER_SITE_OTHER): Payer: Medicare Other

## 2021-03-25 DIAGNOSIS — I441 Atrioventricular block, second degree: Secondary | ICD-10-CM | POA: Diagnosis not present

## 2021-03-25 LAB — CUP PACEART REMOTE DEVICE CHECK
Battery Remaining Longevity: 114 mo
Battery Remaining Percentage: 100 %
Brady Statistic RA Percent Paced: 57 %
Brady Statistic RV Percent Paced: 42 %
Date Time Interrogation Session: 20220713020200
Implantable Lead Implant Date: 20171205
Implantable Lead Implant Date: 20171205
Implantable Lead Location: 753859
Implantable Lead Location: 753860
Implantable Lead Model: 7740
Implantable Lead Model: 7741
Implantable Lead Serial Number: 673793
Implantable Lead Serial Number: 819256
Implantable Pulse Generator Implant Date: 20171205
Lead Channel Impedance Value: 681 Ohm
Lead Channel Impedance Value: 712 Ohm
Lead Channel Pacing Threshold Amplitude: 0.6 V
Lead Channel Pacing Threshold Amplitude: 1.1 V
Lead Channel Pacing Threshold Pulse Width: 0.4 ms
Lead Channel Pacing Threshold Pulse Width: 0.4 ms
Lead Channel Setting Pacing Amplitude: 2 V
Lead Channel Setting Pacing Amplitude: 2.5 V
Lead Channel Setting Pacing Pulse Width: 0.4 ms
Lead Channel Setting Sensing Sensitivity: 2.5 mV
Pulse Gen Serial Number: 732442

## 2021-04-17 NOTE — Progress Notes (Signed)
Remote pacemaker transmission.   

## 2021-06-29 ENCOUNTER — Ambulatory Visit (INDEPENDENT_AMBULATORY_CARE_PROVIDER_SITE_OTHER): Payer: Medicare Other

## 2021-06-29 DIAGNOSIS — I441 Atrioventricular block, second degree: Secondary | ICD-10-CM

## 2021-07-02 LAB — CUP PACEART REMOTE DEVICE CHECK
Date Time Interrogation Session: 20221020080006
Implantable Lead Implant Date: 20171205
Implantable Lead Implant Date: 20171205
Implantable Lead Location: 753859
Implantable Lead Location: 753860
Implantable Lead Model: 7740
Implantable Lead Model: 7741
Implantable Lead Serial Number: 673793
Implantable Lead Serial Number: 819256
Implantable Pulse Generator Implant Date: 20171205
Pulse Gen Serial Number: 732442

## 2021-07-08 NOTE — Progress Notes (Signed)
Remote pacemaker transmission.   

## 2021-08-25 ENCOUNTER — Encounter: Payer: Self-pay | Admitting: Physician Assistant

## 2021-08-25 ENCOUNTER — Emergency Department (HOSPITAL_COMMUNITY): Payer: Medicare Other

## 2021-08-25 ENCOUNTER — Other Ambulatory Visit: Payer: Self-pay

## 2021-08-25 ENCOUNTER — Inpatient Hospital Stay (HOSPITAL_COMMUNITY)
Admission: EM | Admit: 2021-08-25 | Discharge: 2021-08-30 | DRG: 234 | Disposition: A | Discharge status: Home / Self Care | Payer: Medicare Other | Attending: Surgery | Admitting: Surgery

## 2021-08-25 ENCOUNTER — Ambulatory Visit (INDEPENDENT_AMBULATORY_CARE_PROVIDER_SITE_OTHER): Payer: Medicare Other | Admitting: Physician Assistant

## 2021-08-25 VITALS — BP 142/86 | HR 67 | Ht 65.0 in | Wt 183.4 lb

## 2021-08-25 DIAGNOSIS — I25110 Atherosclerotic heart disease of native coronary artery with unstable angina pectoris: Principal | ICD-10-CM | POA: Diagnosis present

## 2021-08-25 DIAGNOSIS — R079 Chest pain, unspecified: Secondary | ICD-10-CM

## 2021-08-25 DIAGNOSIS — Z801 Family history of malignant neoplasm of trachea, bronchus and lung: Secondary | ICD-10-CM

## 2021-08-25 DIAGNOSIS — Z79899 Other long term (current) drug therapy: Secondary | ICD-10-CM

## 2021-08-25 DIAGNOSIS — I441 Atrioventricular block, second degree: Secondary | ICD-10-CM | POA: Diagnosis present

## 2021-08-25 DIAGNOSIS — I2510 Atherosclerotic heart disease of native coronary artery without angina pectoris: Secondary | ICD-10-CM | POA: Diagnosis not present

## 2021-08-25 DIAGNOSIS — Z9049 Acquired absence of other specified parts of digestive tract: Secondary | ICD-10-CM

## 2021-08-25 DIAGNOSIS — I200 Unstable angina: Secondary | ICD-10-CM

## 2021-08-25 DIAGNOSIS — Z955 Presence of coronary angioplasty implant and graft: Secondary | ICD-10-CM

## 2021-08-25 DIAGNOSIS — Z950 Presence of cardiac pacemaker: Secondary | ICD-10-CM

## 2021-08-25 DIAGNOSIS — Z9861 Coronary angioplasty status: Secondary | ICD-10-CM

## 2021-08-25 DIAGNOSIS — I480 Paroxysmal atrial fibrillation: Secondary | ICD-10-CM | POA: Diagnosis present

## 2021-08-25 DIAGNOSIS — Z90710 Acquired absence of both cervix and uterus: Secondary | ICD-10-CM

## 2021-08-25 DIAGNOSIS — Z7984 Long term (current) use of oral hypoglycemic drugs: Secondary | ICD-10-CM

## 2021-08-25 DIAGNOSIS — Z888 Allergy status to other drugs, medicaments and biological substances status: Secondary | ICD-10-CM

## 2021-08-25 DIAGNOSIS — Z8249 Family history of ischemic heart disease and other diseases of the circulatory system: Secondary | ICD-10-CM

## 2021-08-25 DIAGNOSIS — Z7901 Long term (current) use of anticoagulants: Secondary | ICD-10-CM

## 2021-08-25 DIAGNOSIS — E7800 Pure hypercholesterolemia, unspecified: Secondary | ICD-10-CM | POA: Diagnosis present

## 2021-08-25 DIAGNOSIS — K219 Gastro-esophageal reflux disease without esophagitis: Secondary | ICD-10-CM | POA: Diagnosis present

## 2021-08-25 DIAGNOSIS — Z833 Family history of diabetes mellitus: Secondary | ICD-10-CM

## 2021-08-25 DIAGNOSIS — J9811 Atelectasis: Secondary | ICD-10-CM | POA: Diagnosis not present

## 2021-08-25 DIAGNOSIS — Z20822 Contact with and (suspected) exposure to covid-19: Secondary | ICD-10-CM | POA: Diagnosis present

## 2021-08-25 DIAGNOSIS — E8770 Fluid overload, unspecified: Secondary | ICD-10-CM | POA: Diagnosis not present

## 2021-08-25 DIAGNOSIS — I25119 Atherosclerotic heart disease of native coronary artery with unspecified angina pectoris: Secondary | ICD-10-CM | POA: Diagnosis present

## 2021-08-25 DIAGNOSIS — I10 Essential (primary) hypertension: Secondary | ICD-10-CM | POA: Diagnosis present

## 2021-08-25 DIAGNOSIS — Z951 Presence of aortocoronary bypass graft: Secondary | ICD-10-CM

## 2021-08-25 DIAGNOSIS — E119 Type 2 diabetes mellitus without complications: Secondary | ICD-10-CM | POA: Diagnosis present

## 2021-08-25 LAB — CBC WITH DIFFERENTIAL/PLATELET
Abs Immature Granulocytes: 0.01 10*3/uL (ref 0.00–0.07)
Basophils Absolute: 0.1 10*3/uL (ref 0.0–0.1)
Basophils Relative: 1 %
Eosinophils Absolute: 0.1 10*3/uL (ref 0.0–0.5)
Eosinophils Relative: 2 %
HCT: 44.8 % (ref 36.0–46.0)
Hemoglobin: 14 g/dL (ref 12.0–15.0)
Immature Granulocytes: 0 %
Lymphocytes Relative: 19 %
Lymphs Abs: 1.4 10*3/uL (ref 0.7–4.0)
MCH: 26.4 pg (ref 26.0–34.0)
MCHC: 31.3 g/dL (ref 30.0–36.0)
MCV: 84.4 fL (ref 80.0–100.0)
Monocytes Absolute: 0.4 10*3/uL (ref 0.1–1.0)
Monocytes Relative: 6 %
Neutro Abs: 5.2 10*3/uL (ref 1.7–7.7)
Neutrophils Relative %: 72 %
Platelets: 239 10*3/uL (ref 150–400)
RBC: 5.31 MIL/uL — ABNORMAL HIGH (ref 3.87–5.11)
RDW: 13.7 % (ref 11.5–15.5)
WBC: 7.1 10*3/uL (ref 4.0–10.5)
nRBC: 0 % (ref 0.0–0.2)

## 2021-08-25 LAB — CUP PACEART INCLINIC DEVICE CHECK
Date Time Interrogation Session: 20221213123117
Implantable Lead Implant Date: 20171205
Implantable Lead Implant Date: 20171205
Implantable Lead Location: 753859
Implantable Lead Location: 753860
Implantable Lead Model: 7740
Implantable Lead Model: 7741
Implantable Lead Serial Number: 673793
Implantable Lead Serial Number: 819256
Implantable Pulse Generator Implant Date: 20171205
Lead Channel Impedance Value: 727 Ohm
Lead Channel Impedance Value: 797 Ohm
Lead Channel Pacing Threshold Amplitude: 0.9 V
Lead Channel Pacing Threshold Amplitude: 1 V
Lead Channel Pacing Threshold Pulse Width: 0.4 ms
Lead Channel Pacing Threshold Pulse Width: 0.4 ms
Lead Channel Sensing Intrinsic Amplitude: 4.4 mV
Lead Channel Setting Pacing Amplitude: 2 V
Lead Channel Setting Pacing Amplitude: 2.5 V
Lead Channel Setting Pacing Pulse Width: 0.4 ms
Lead Channel Setting Sensing Sensitivity: 2.5 mV
Pulse Gen Serial Number: 732442

## 2021-08-25 LAB — HEMOGLOBIN A1C
Hgb A1c MFr Bld: 7.1 % — ABNORMAL HIGH (ref 4.8–5.6)
Mean Plasma Glucose: 157.07 mg/dL

## 2021-08-25 LAB — BASIC METABOLIC PANEL
Anion gap: 8 (ref 5–15)
BUN: 16 mg/dL (ref 8–23)
CO2: 28 mmol/L (ref 22–32)
Calcium: 9.2 mg/dL (ref 8.9–10.3)
Chloride: 105 mmol/L (ref 98–111)
Creatinine, Ser: 0.86 mg/dL (ref 0.44–1.00)
GFR, Estimated: >60 mL/min (ref >60)
Glucose, Bld: 116 mg/dL — ABNORMAL HIGH (ref 70–99)
Potassium: 3.7 mmol/L (ref 3.5–5.1)
Sodium: 141 mmol/L (ref 135–145)

## 2021-08-25 LAB — CBG MONITORING, ED: Glucose-Capillary: 175 mg/dL — ABNORMAL HIGH (ref 70–99)

## 2021-08-25 LAB — TROPONIN I (HIGH SENSITIVITY)
Troponin I (High Sensitivity): 11 ng/L (ref <18)
Troponin I (High Sensitivity): 12 ng/L (ref <18)
Troponin I (High Sensitivity): 11 ng/L (ref <18)

## 2021-08-25 LAB — PROTIME-INR
INR: 1.2 (ref 0.8–1.2)
Prothrombin Time: 15 seconds (ref 11.4–15.2)

## 2021-08-25 LAB — TSH: TSH: 1.572 u[IU]/mL (ref 0.350–4.500)

## 2021-08-25 LAB — RESP PANEL BY RT-PCR (FLU A&B, COVID) ARPGX2
Influenza A by PCR: NEGATIVE
Influenza B by PCR: NEGATIVE
SARS Coronavirus 2 by RT PCR: NEGATIVE

## 2021-08-25 LAB — BRAIN NATRIURETIC PEPTIDE: B Natriuretic Peptide: 328.3 pg/mL — ABNORMAL HIGH (ref 0.0–100.0)

## 2021-08-25 LAB — APTT: aPTT: 30 seconds (ref 24–36)

## 2021-08-25 LAB — HEPARIN LEVEL (UNFRACTIONATED): Heparin Unfractionated: >1.1 IU/mL — ABNORMAL HIGH (ref 0.30–0.70)

## 2021-08-25 MED ORDER — VITAMIN D 25 MCG (1000 UNIT) PO TABS: 2000.0000 [IU] | ORAL_TABLET | Freq: Every morning | ORAL | Status: DC

## 2021-08-25 MED ORDER — NITROGLYCERIN 0.4 MG SL SUBL: 0.4000 mg | SUBLINGUAL_TABLET | SUBLINGUAL | Status: DC | PRN

## 2021-08-25 MED ORDER — NITROGLYCERIN 0.4 MG/HR TD PT24
0.4000 mg | MEDICATED_PATCH | Freq: Every day | TRANSDERMAL | Status: DC
  Administered 2021-08-25: 0.4 mg via TRANSDERMAL
  Filled 2021-08-25 (×2): qty 1

## 2021-08-25 MED ORDER — ASPIRIN EC 81 MG PO TBEC: 81.0000 mg | DELAYED_RELEASE_TABLET | Freq: Every day | ORAL | Status: DC

## 2021-08-25 MED ORDER — HEPARIN (PORCINE) 25000 UT/250ML-% IV SOLN
1200.0000 [IU]/h | INTRAVENOUS | Status: DC
  Administered 2021-08-25: 1200 [IU]/h via INTRAVENOUS
  Filled 2021-08-25: qty 250

## 2021-08-25 MED ORDER — INSULIN ASPART 100 UNIT/ML IJ SOLN: 0.0000 [IU] | Freq: Three times a day (TID) | INTRAMUSCULAR | Status: DC

## 2021-08-25 MED ORDER — ROSUVASTATIN CALCIUM 20 MG PO TABS
40.0000 mg | ORAL_TABLET | Freq: Every day | ORAL | Status: DC
  Administered 2021-08-25 – 2021-08-29 (×5): 40 mg via ORAL
  Filled 2021-08-25 (×5): qty 2

## 2021-08-25 MED ORDER — ATENOLOL 25 MG PO TABS: 25.0000 mg | ORAL_TABLET | Freq: Every morning | ORAL | Status: DC

## 2021-08-25 MED ORDER — ACETAMINOPHEN 325 MG PO TABS
650.0000 mg | ORAL_TABLET | ORAL | Status: DC | PRN
  Administered 2021-08-25: 650 mg via ORAL
  Filled 2021-08-25: qty 2

## 2021-08-25 MED ORDER — PANTOPRAZOLE SODIUM 40 MG PO TBEC: 40.0000 mg | DELAYED_RELEASE_TABLET | Freq: Every day | ORAL | Status: DC

## 2021-08-25 MED ORDER — SODIUM CHLORIDE 0.9% FLUSH
3.0000 mL | Freq: Two times a day (BID) | INTRAVENOUS | Status: DC
  Administered 2021-08-25 (×2): 3 mL via INTRAVENOUS

## 2021-08-25 MED ORDER — ONDANSETRON HCL 4 MG/2ML IJ SOLN: 4.0000 mg | Freq: Four times a day (QID) | INTRAMUSCULAR | Status: DC | PRN

## 2021-08-25 NOTE — Patient Instructions (Signed)
YOU HAVE BEEN RECOMMENDED TO GO TO EMERGENCY ROOM PER PROVIDER  RENEE URSUY PA-C

## 2021-08-25 NOTE — ED Notes (Signed)
Patient is resting comfortably. Sitting in bed watching television

## 2021-08-25 NOTE — Progress Notes (Addendum)
Cardiology Office Note Date:  08/25/2021  Patient ID:  [name], [name] 02-May-1946, MRN [id number] PCP:  [name] Inch, MD  Electrophysiologist: Dr. Ladona Ridgel  refresh   Chief Complaint: CP  History of Present Illness: [name] [name] is a 75 y.o. female with history of CAD (PCI 2008), HTN, HLD, DM, Afib, advanced heart block w/PPM  She comes in today to be seen for Dr. Ladona Ridgel, last seen by him Jan 2022, she had lost weight intentionally and was doing well.  No changes were made  She comes today with c/o of about a month of chest discomfort. Up to a month ago she was very active with no exertional intolerances.  She was walking regularly, line dancing, caring for her home including yard work. She started to get a central/L chest discomfort seemed to be proceed by a heavy pounding sensation and then an aching/heaviness to her L chest, encompassed her breast area and radiates to her L shoulder/axilla and down to her elbow Lasts about 10 minutes or so, resolving with rest Perhaps an associated feeling of being winded, no diaphoresis, no near syncope or syncope, no N/V. Initially this was only exertional, and has stopped her form doing her usual exercise/activities, in the last couple weeks has happened even at rest, just comes on.  Not food/meal related but has also noted that she does seem to be belching often, wether or not she is having the CP  She initially was not too concerned, figured whatever it was would go away, but has gotten worse, happening more often even several times some days Rates 6-8/10 scale when she has it Will sit or if seated, just breathe through it  She has not had it today, is not active   Device information BSci dual chamber PPM implanted 08/17/2016   Past Medical History:  Diagnosis Date   Atrial fibrillation (HCC)    AV block, 2nd degree 08/2016   Constipation    Coronary artery disease    Cystocele with prolapse    Cystocele with  rectocele    Diabetes mellitus without complication (HCC)    type 2   GERD (gastroesophageal reflux disease)    Headache    sinus migraine occ   High cholesterol    Hypertension    Presence of permanent cardiac pacemaker 08/17/2016   [employer]   Wears glasses     Past Surgical History:  Procedure Laterality Date   ABDOMINAL HYSTERECTOMY  age 89   partial   APPENDECTOMY  age 73   BACK SURGERY  14 yrs ago   L 5   basal cell area removed  2019   shoulder   colonscopy  2019   polyps removed   CORONARY ANGIOPLASTY  14 yrs ago   stent x 1    CYSTOCELE REPAIR N/A 12/08/2020   Procedure: ANTERIOR REPAIR (CYSTOCELE);  Surgeon: Huel Cote, MD;  Location: [facility];  Service: Gynecology;  Laterality: N/A;   EP IMPLANTABLE DEVICE N/A 08/17/2016   Procedure: Pacemaker Implant;  Surgeon: Marinus Maw, MD;  Location: [facility] INVASIVE CV LAB;  Service: Cardiovascular;  Laterality: N/A;   FOOT SURGERY Left yrs ago   INSERT / REPLACE / REMOVE PACEMAKER  08/17/2016   KNEE ARTHROSCOPY Right yrs ago   ROTATOR CUFF REPAIR Right yrs ago    Current Outpatient Medications  Medication Sig Dispense Refill   atenolol (TENORMIN) 50 MG tablet Take 25 mg by mouth every morning.  Cholecalciferol (VITAMIN D3) 1000 units CAPS Take 2,000 Units by mouth every morning.      Docusate Calcium (STOOL SOFTENER PO) Take 3 capsules by mouth daily.     ibuprofen (ADVIL) 600 MG tablet Take 1 tablet (600 mg total) by mouth every 6 (six) hours as needed for moderate pain. 30 tablet 0   lisinopril-hydrochlorothiazide (PRINZIDE,ZESTORETIC) 10-12.5 MG tablet Take 1 tablet by mouth every morning.      metFORMIN (GLUCOPHAGE-XR) 500 MG 24 hr tablet Take 500 mg by mouth 2 (two) times daily.      nitroGLYCERIN (NITROSTAT) 0.4 MG SL tablet Place 0.4 mg as needed under the tongue for chest pain.     omeprazole (PRILOSEC) 20 MG capsule Take 20 mg by mouth every morning.      rosuvastatin (CRESTOR)  40 MG tablet Take 40 mg by mouth at bedtime.      XARELTO 20 MG TABS tablet TAKE ONE TABLET (20MG  DOSE) BY MOUTH DAILY (Patient taking differently: at bedtime.) 90 tablet 1   acetaminophen (TYLENOL) 500 MG tablet Take 2 tablets (1,000 mg total) by mouth every 6 (six) hours. (Patient not taking: Reported on 08/25/2021) 30 tablet 0   No current facility-administered medications for this visit.    Allergies:   Propoxyphene   Social History:  The patient  reports that she has never smoked. She has never used smokeless tobacco. She reports current alcohol use. She reports that she does not use drugs.   Family History:  The patient's family history includes Cancer - Colon in her sister; Cancer - Lung in her brother and brother; Diabetes in her mother and sister; Heart attack in her father; Heart disease in her mother and son.  ROS:  Please see the history of present illness.    All other systems are reviewed and otherwise negative.   PHYSICAL EXAM:  VS:  BP (!) 142/86   Pulse 67   Ht 5\' 5"  (1.651 m)   Wt 183 lb 6.4 oz (83.2 kg)   SpO2 95%   BMI 30.52 kg/m  BMI: Body mass index is 30.52 kg/m. Well nourished, well developed, in no acute distress HEENT: normocephalic, atraumatic Neck: no JVD, carotid bruits or masses Cardiac:  RRR; no significant murmurs, no rubs, or gallops Lungs:  CTA b/l, no wheezing, rhonchi or rales Abd: soft, nontender MS: no deformity or atrophy Ext:  no edema Skin: warm and dry, no rash Neuro:  No gross deficits appreciated Psych: euthymic mood, full affect  PPM site is stable, no tethering or discomfort   EKG:  Done today and reviewed by myself shows  SR/ Vpaced 1st degree block w/V pacing (programmed DDD with AAI-VVI  Device interrogation done today and reviewed by myself:  Battery and lead measurements are goo No arrhythmias AP 54% VP 68% No R waves today at 40bpm   08/17/2016: TTE Study Conclusions  - Left ventricle: The cavity size was normal.  There was moderate    concentric hypertrophy. Systolic function was normal. The    estimated ejection fraction was in the range of 60% to 65%. Wall    motion was normal; there were no regional wall motion    abnormalities. Left ventricular diastolic function parameters    were normal.  - Aortic valve: Mildly to moderately calcified annulus. Trileaflet;    normal thickness, mildly calcified leaflets. There was trivial    regurgitation. Valve area (VTI): 1.98 cm^2. Valve area (Vmax):    1.99 cm^2. Valve area (Vmean): 1.99  cm^2.  - Mitral valve: Calcified annulus. There was mild regurgitation.  - Atrial septum: There was increased thickness of the septum,    consistent with lipomatous hypertrophy.   Recent Labs: 12/04/2020: BUN 14; Creatinine, Ser 1.17; Hemoglobin 13.8; Platelets 228; Potassium 4.6; Sodium 137  No results found for requested labs within last 8760 hours.   CrCl cannot be calculated (Patient's most recent lab result is older than the maximum 21 days allowed.).   Wt Readings from Last 3 Encounters:  08/25/21 183 lb 6.4 oz (83.2 kg)  12/08/20 175 lb 3.2 oz (79.5 kg)  12/04/20 172 lb (78 kg)     Other studies reviewed: Additional studies/records reviewed today include: summarized above  ASSESSMENT AND PLAN:  PPM Intact function  Paroxysmal AFib CHA2DS2Vasc is 4, on  Xarelto, appropriately dosed 0 % burden  CAD New c/o CP/pressure  Escalating in behavior No active at this time EKG is paced  She has not tried her s/l NTG She is agreeable to a hospital admission, but do not think we should wait on a bed Dr. Ladona Ridgel has seen her, agrees with sending her to the ER, the patient is as well agreeable, she currently feels well and will drive herself  She will need cath, took her Xarelto last evening, so probably not until tomorrow  I have made our hospital team aware and spoken to the chare RN in the ER  HTN Not addressed today    Disposition: F/u pending her  hospital stay evolution and recommendations  Current medicines are reviewed at length with the patient today.  The patient did not have any concerns regarding medicines.  Norma Fredrickson, PA-C 08/25/2021 12:02 PM     [facility] [address] [place] [address] [phone] (office)  [phone] (fax)

## 2021-08-25 NOTE — ED Notes (Signed)
Pt called for room, no answer.

## 2021-08-25 NOTE — ED Triage Notes (Signed)
Pt. Stated, Ive had chest pain and some SOB for about a month and it seems like its getting worse. I went to see Dr. Ladona Ridgel and he sent me here.

## 2021-08-25 NOTE — ED Provider Notes (Signed)
[place] EMERGENCY DEPARTMENT Provider Note  CSN: [id number] Arrival date & time: 08/25/21 1130    History Chief Complaint  Patient presents with   Chest Pain   Shortness of Breath    [name] [name] is a 75 y.o. female with history of CAD, Afib on Xarelto, and heart block s/p PPM. She was in the cardiology office earlier today for about a month of progressively worsening exertional CP. Mild SOB, improved with rest. Currently symptoms are minimal. She was sent to the ED for admission to have LHC tomorrow.    Past Medical History:  Diagnosis Date   Atrial fibrillation (HCC)    AV block, 2nd degree 08/2016   Constipation    Coronary artery disease    Cystocele with prolapse    Cystocele with rectocele    Diabetes mellitus without complication (HCC)    type 2   GERD (gastroesophageal reflux disease)    Headache    sinus migraine occ   High cholesterol    Hypertension    Presence of permanent cardiac pacemaker 08/17/2016   [employer]   Wears glasses     Past Surgical History:  Procedure Laterality Date   ABDOMINAL HYSTERECTOMY  age 4   partial   APPENDECTOMY  age 28   BACK SURGERY  14 yrs ago   L 5   basal cell area removed  2019   shoulder   colonscopy  2019   polyps removed   CORONARY ANGIOPLASTY  14 yrs ago   stent x 1    CYSTOCELE REPAIR N/A 12/08/2020   Procedure: ANTERIOR REPAIR (CYSTOCELE);  Surgeon: Paula Compton, MD;  Location: [facility];  Service: Gynecology;  Laterality: N/A;   EP IMPLANTABLE DEVICE N/A 08/17/2016   Procedure: Pacemaker Implant;  Surgeon: Evans Lance, MD;  Location: [place] CV LAB;  Service: Cardiovascular;  Laterality: N/A;   FOOT SURGERY Left yrs ago   INSERT / REPLACE / REMOVE PACEMAKER  08/17/2016   KNEE ARTHROSCOPY Right yrs ago   ROTATOR CUFF REPAIR Right yrs ago    Family History  Problem Relation Age of Onset   Heart disease Mother    Diabetes Mother    Heart attack Father     Cancer - Colon Sister    Diabetes Sister    Cancer - Lung Brother    Cancer - Lung Brother    Heart disease Son     Social History   Tobacco Use   Smoking status: Never   Smokeless tobacco: Never  Vaping Use   Vaping Use: Never used  Substance Use Topics   Alcohol use: Yes    Comment: occ wine   Drug use: No     Home Medications Prior to Admission medications   Medication Sig Start Date End Date Taking? Authorizing Provider  acetaminophen (TYLENOL) 500 MG tablet Take 2 tablets (1,000 mg total) by mouth every 6 (six) hours. Patient not taking: Reported on 08/25/2021 12/08/20   Paula Compton, MD  atenolol (TENORMIN) 50 MG tablet Take 25 mg by mouth every morning.  04/29/15   [provider]  Cholecalciferol (VITAMIN D3) 1000 units CAPS Take 2,000 Units by mouth every morning.     [provider]  Docusate Calcium (STOOL SOFTENER PO) Take 3 capsules by mouth daily.    [provider]  ibuprofen (ADVIL) 600 MG tablet Take 1 tablet (600 mg total) by mouth every 6 (six) hours as needed for moderate pain. 12/08/20  Huel Cote, MD  lisinopril-hydrochlorothiazide (PRINZIDE,ZESTORETIC) 10-12.5 MG tablet Take 1 tablet by mouth every morning.  08/16/16   [provider]  metFORMIN (GLUCOPHAGE-XR) 500 MG 24 hr tablet Take 500 mg by mouth 2 (two) times daily.  07/12/16   [provider]  nitroGLYCERIN (NITROSTAT) 0.4 MG SL tablet Place 0.4 mg as needed under the tongue for chest pain. 04/05/17   [provider]  omeprazole (PRILOSEC) 20 MG capsule Take 20 mg by mouth every morning.  08/16/16   [provider]  rosuvastatin (CRESTOR) 40 MG tablet Take 40 mg by mouth at bedtime.  08/16/16   [provider]  XARELTO 20 MG TABS tablet TAKE ONE TABLET (20MG  DOSE) BY MOUTH DAILY Patient taking differently: at bedtime. 03/19/20   05/20/20, MD     Allergies    Propoxyphene   Review of Systems   Review of  Systems A comprehensive review of systems was completed and negative except as noted in HPI.    Physical Exam BP (!) 164/75    Pulse 60    Temp 98.7 F (37.1 C)    Resp 12    SpO2 98%   Physical Exam Vitals and nursing note reviewed.  Constitutional:      Appearance: Normal appearance.  HENT:     Head: Normocephalic and atraumatic.     Nose: Nose normal.     Mouth/Throat:     Mouth: Mucous membranes are moist.  Eyes:     Extraocular Movements: Extraocular movements intact.     Conjunctiva/sclera: Conjunctivae normal.  Cardiovascular:     Rate and Rhythm: Normal rate.  Pulmonary:     Effort: Pulmonary effort is normal.     Breath sounds: Normal breath sounds.  Abdominal:     General: Abdomen is flat.     Palpations: Abdomen is soft.     Tenderness: There is no abdominal tenderness.  Musculoskeletal:        General: No swelling. Normal range of motion.     Cervical back: Neck supple.  Skin:    General: Skin is warm and dry.  Neurological:     General: No focal deficit present.     Mental Status: She is alert.  Psychiatric:        Mood and Affect: Mood normal.     ED Results / Procedures / Treatments   Labs (all labs ordered are listed, but only abnormal results are displayed) Labs Reviewed  BASIC METABOLIC PANEL - Abnormal; Notable for the following components:      Result Value   Glucose, Bld 116 (*)    All other components within normal limits  CBC WITH DIFFERENTIAL/PLATELET - Abnormal; Notable for the following components:   RBC 5.31 (*)    All other components within normal limits  BRAIN NATRIURETIC PEPTIDE - Abnormal; Notable for the following components:   B Natriuretic Peptide 328.3 (*)    All other components within normal limits  TROPONIN I (HIGH SENSITIVITY)  TROPONIN I (HIGH SENSITIVITY)    EKG EKG Interpretation  Date/Time:  Tuesday August 25 2021 11:44:57 EST Ventricular Rate:  72 PR Interval:  336 QRS Duration: 178 QT  Interval:  448 QTC Calculation: 490 R Axis:   -65 Text Interpretation: Atrial-sensed ventricular-paced rhythm with prolonged AV conduction Abnormal ECG No significant change since last tracing Confirmed by [id number] [phone]) on 08/25/2021 2:59:32 PM  Radiology DG Chest 2 View  Result Date: 08/25/2021 CLINICAL DATA:  Shortness of breath,  LEFT side chest pain, symptoms for 1 month, getting worse EXAM: CHEST - 2 VIEW COMPARISON:  08/18/2016 FINDINGS: LEFT subclavian sequential transvenous pacemaker leads project at RIGHT atrium and RIGHT ventricle. Normal heart size, mediastinal contours, and pulmonary vascularity. Mild chronic bronchitic changes with minimal chronic bibasilar atelectasis. No acute infiltrate, pleural effusion or pneumothorax. Bones demineralized. IMPRESSION: Mild chronic bronchitic changes and minimal bibasilar atelectasis. No acute abnormalities. Electronically Signed   By: Lavonia Dana M.D.   On: 08/25/2021 12:29   CUP PACEART INCLINIC DEVICE CHECK  Result Date: 08/25/2021 Pacemaker check in clinic. Normal device function. Thresholds, sensing, impedances consistent with previous measurements. Device programmed to maximize longevity. No mode switch or high ventricular rates noted. Device programmed at appropriate safety margins. Histogram distribution appropriate for patient activity level. Device programmed to optimize intrinsic conduction. Estimated longevity _9 years__. Patient enrolled in remote follow-up. Patient education completed. She has no R waves today at 40, but only VP 68%   Procedures Procedures  Medications Ordered in the ED Medications - No data to display   MDM Rules/Calculators/A&P MDM Patient here from cardiology office for admission to have [place] tomorrow. Currently not having any significant discomfort. She is aware of the plan. Labs and imaging done in triage reviewed and unremarkable, including CBC, BMP, Initial trop and CXR. Spoke with [employer],  who is aware of patient and will arrange for Cardiology team to admit.   ED Course  I have reviewed the triage vital signs and the nursing notes.  Pertinent labs & imaging results that were available during my care of the patient were reviewed by me and considered in my medical decision making (see chart for details).     Final Clinical Impression(s) / ED Diagnoses Final diagnoses:  Unstable angina [facility])    Rx / DC Orders ED Discharge Orders     None        Truddie Hidden, MD 08/25/21 [phone]

## 2021-08-25 NOTE — H&P (Addendum)
Cardiology Office Note Date:  08/25/2021  Patient ID:  Maretta, Overdorf 1946-06-25, MRN [id number] PCP:  Eartha Inch, MD             Electrophysiologist: Dr. Ladona Ridgel    Chief Complaint: CP   History of Present Illness: [name] [name] is a 75 y.o. female with history of CAD (PCI 2008), HTN, HLD, DM, Afib, advanced heart block w/PPM   She comes in today to be seen for Dr. Ladona Ridgel, last seen by him Jan 2022, she had lost weight intentionally and was doing well.  No changes were made   She comes today with c/o of about a month of chest discomfort. Up to a month ago she was very active with no exertional intolerances.  She was walking regularly, line dancing, caring for her home including yard work. She started to get a central/L chest discomfort seemed to be proceed by a heavy pounding sensation and then an aching/heaviness to her L chest, encompassed her breast area and radiates to her L shoulder/axilla and down to her elbow Lasts about 10 minutes or so, resolving with rest Perhaps an associated feeling of being winded, no diaphoresis, no near syncope or syncope, no N/V. Initially this was only exertional, and has stopped her form doing her usual exercise/activities, in the last couple weeks has happened even at rest, just comes on.   Not food/meal related but has also noted that she does seem to be belching often, wether or not she is having the CP   She initially was not too concerned, figured whatever it was would go away, but has gotten worse, happening more often even several times some days Rates 6-8/10 scale when she has it Will sit or if seated, just breathe through it   She has not had it today, is not active     Device information BSci dual chamber PPM implanted 08/17/2016         Past Medical History:  Diagnosis Date   Atrial fibrillation (HCC)     AV block, 2nd degree 08/2016   Constipation     Coronary artery disease     Cystocele with prolapse      Cystocele with rectocele     Diabetes mellitus without complication (HCC)      type 2   GERD (gastroesophageal reflux disease)     Headache      sinus migraine occ   High cholesterol     Hypertension     Presence of permanent cardiac pacemaker 08/17/2016    [employer]   Wears glasses             Past Surgical History:  Procedure Laterality Date   ABDOMINAL HYSTERECTOMY   age 70    partial   APPENDECTOMY   age 48   BACK SURGERY   14 yrs ago    L 5   basal cell area removed   2019    shoulder   colonscopy   2019    polyps removed   CORONARY ANGIOPLASTY   14 yrs ago    stent x 1    CYSTOCELE REPAIR N/A 12/08/2020    Procedure: ANTERIOR REPAIR (CYSTOCELE);  Surgeon: Huel Cote, MD;  Location: [facility];  Service: Gynecology;  Laterality: N/A;   EP IMPLANTABLE DEVICE N/A 08/17/2016    Procedure: Pacemaker Implant;  Surgeon: Marinus Maw, MD;  Location: [facility] INVASIVE CV LAB;  Service: Cardiovascular;  Laterality: N/A;  FOOT SURGERY Left yrs ago   INSERT / REPLACE / REMOVE PACEMAKER   08/17/2016   KNEE ARTHROSCOPY Right yrs ago   ROTATOR CUFF REPAIR Right yrs ago            Current Outpatient Medications  Medication Sig Dispense Refill   atenolol (TENORMIN) 50 MG tablet Take 25 mg by mouth every morning.        Cholecalciferol (VITAMIN D3) 1000 units CAPS Take 2,000 Units by mouth every morning.        Docusate Calcium (STOOL SOFTENER PO) Take 3 capsules by mouth daily.       ibuprofen (ADVIL) 600 MG tablet Take 1 tablet (600 mg total) by mouth every 6 (six) hours as needed for moderate pain. 30 tablet 0   lisinopril-hydrochlorothiazide (PRINZIDE,ZESTORETIC) 10-12.5 MG tablet Take 1 tablet by mouth every morning.        metFORMIN (GLUCOPHAGE-XR) 500 MG 24 hr tablet Take 500 mg by mouth 2 (two) times daily.        nitroGLYCERIN (NITROSTAT) 0.4 MG SL tablet Place 0.4 mg as needed under the tongue for chest pain.       omeprazole (PRILOSEC) 20 MG  capsule Take 20 mg by mouth every morning.        rosuvastatin (CRESTOR) 40 MG tablet Take 40 mg by mouth at bedtime.        XARELTO 20 MG TABS tablet TAKE ONE TABLET (20MG  DOSE) BY MOUTH DAILY (Patient taking differently: at bedtime.) 90 tablet 1   acetaminophen (TYLENOL) 500 MG tablet Take 2 tablets (1,000 mg total) by mouth every 6 (six) hours. (Patient not taking: Reported on 08/25/2021) 30 tablet 0    No current facility-administered medications for this visit.      Allergies:   Propoxyphene    Social History:  The patient  reports that she has never smoked. She has never used smokeless tobacco. She reports current alcohol use. She reports that she does not use drugs.    Family History:  The patient's family history includes Cancer - Colon in her sister; Cancer - Lung in her brother and brother; Diabetes in her mother and sister; Heart attack in her father; Heart disease in her mother and son.   ROS:  Please see the history of present illness.    All other systems are reviewed and otherwise negative.    PHYSICAL EXAM:  VS:  BP (!) 142/86    Pulse 67    Ht 5\' 5"  (1.651 m)    Wt 183 lb 6.4 oz (83.2 kg)    SpO2 95%    BMI 30.52 kg/m  BMI: Body mass index is 30.52 kg/m. Well nourished, well developed, in no acute distress HEENT: normocephalic, atraumatic Neck: no JVD, carotid bruits or masses Cardiac:  RRR; no significant murmurs, no rubs, or gallops Lungs:  CTA b/l, no wheezing, rhonchi or rales Abd: soft, nontender MS: no deformity or atrophy Ext:  no edema Skin: warm and dry, no rash Neuro:  No gross deficits appreciated Psych: euthymic mood, full affect   PPM site is stable, no tethering or discomfort     EKG:  Done today and reviewed by myself shows  SR/ Vpaced 1st degree block w/V pacing (programmed DDD with AAI-VVI   Device interrogation done today and reviewed by myself:  Battery and lead measurements are goo No arrhythmias AP 54% VP 68% No R waves today at  40bpm     08/17/2016: TTE Study Conclusions  -  Left ventricle: The cavity size was normal. There was moderate    concentric hypertrophy. Systolic function was normal. The    estimated ejection fraction was in the range of 60% to 65%. Wall    motion was normal; there were no regional wall motion    abnormalities. Left ventricular diastolic function parameters    were normal.  - Aortic valve: Mildly to moderately calcified annulus. Trileaflet;    normal thickness, mildly calcified leaflets. There was trivial    regurgitation. Valve area (VTI): 1.98 cm^2. Valve area (Vmax):    1.99 cm^2. Valve area (Vmean): 1.99 cm^2.  - Mitral valve: Calcified annulus. There was mild regurgitation.  - Atrial septum: There was increased thickness of the septum,    consistent with lipomatous hypertrophy.    Recent Labs: 12/04/2020: BUN 14; Creatinine, Ser 1.17; Hemoglobin 13.8; Platelets 228; Potassium 4.6; Sodium 137  No results found for requested labs within last 8760 hours.    CrCl cannot be calculated (Patient's most recent lab result is older than the maximum 21 days allowed.).       Wt Readings from Last 3 Encounters:  08/25/21 183 lb 6.4 oz (83.2 kg)  12/08/20 175 lb 3.2 oz (79.5 kg)  12/04/20 172 lb (78 kg)      Other studies reviewed: Additional studies/records reviewed today include: summarized above   ASSESSMENT AND PLAN:   PPM Intact function   Paroxysmal AFib CHA2DS2Vasc is 4, on  Xarelto, appropriately dosed 0 % burden   CAD New c/o CP/pressure  Escalating in behavior No active at this time EKG is paced   She has not tried her s/l NTG She is agreeable to a hospital admission, but do not think we should wait on a bed Dr. Ladona Ridgel has seen her, agrees with sending her to the ER, the patient is as well agreeable, she currently feels well and will drive herself   She will need cath, took her Xarelto last evening, so probably not until tomorrow   I have made our hospital team  aware and spoken to the chare RN in the ER     Hospital cardiology team will follow her in patient    Signed, Francis Dowse, PA-C 08/25/2021 12:02 PM     [facility] [address] [place] [address] [phone] (office)  [phone] (fax)  EP Attending  Patient seen and examined. She has symptoms consistent with [place] including rest pain. She will be admitted and undergo heart cath. We cannot evaluate her ECG due to the presence of heart block and ventricular pacing.   Sharlot Gowda Lyndee Herbst,MD

## 2021-08-25 NOTE — Progress Notes (Signed)
ANTICOAGULATION CONSULT NOTE - Initial Consult  Pharmacy Consult for Heparin Indication: atrial fibrillation  Allergies  Allergen Reactions   Propoxyphene Rash and Other (See Comments)    Darvocet     Patient Measurements:   Heparin Dosing Weight: 74.8 kg  Vital Signs: Temp: 98.7 F (37.1 C) (12/13 1319) Temp Source: Oral (12/13 1146) BP: 164/75 (12/13 1455) Pulse Rate: 60 (12/13 1455)  Labs: Recent Labs    08/25/21 1153  HGB 14.0  HCT 44.8  PLT 239  CREATININE 0.86  TROPONINIHS 11    Estimated Creatinine Clearance: 61.2 mL/min (by C-G formula based on SCr of 0.86 mg/dL).   Medical History: Past Medical History:  Diagnosis Date   Atrial fibrillation (HCC)    AV block, 2nd degree 08/2016   Constipation    Coronary artery disease    Cystocele with prolapse    Cystocele with rectocele    Diabetes mellitus without complication (HCC)    type 2   GERD (gastroesophageal reflux disease)    Headache    sinus migraine occ   High cholesterol    Hypertension    Presence of permanent cardiac pacemaker 08/17/2016   [employer]   Wears glasses     Medications:  (Not in a hospital admission)  Scheduled:   nitroGLYCERIN  0.4 mg Transdermal Daily   sodium chloride flush  3 mL Intravenous Q12H   Infusions:  PRN:   Assessment: 60 yof with a history of CAD (PCI 2008), HTN, HLD, DM, Afib, advanced heart block w/PPM. Patient is presenting with chest pain. Heparin per pharmacy consult placed for atrial fibrillation. Cards planning for cath tomorrow.  Patient is on xarelto prior to arrival. Last dose 12/12 @ 2200. Will require aPTT monitoring due to likely falsely high anti-Xa level secondary to DOAC use.  Hgb 14; plt 239  Goal of Therapy:  Heparin level 0.3-0.7 units/ml aPTT 66-102 seconds Monitor platelets by anticoagulation protocol: Yes   Plan:  No initial heparin bolus Start heparin infusion at 1200 units/hr Check aPTT & anti-Xa level in 6-8 hours  and daily while on heparin Continue to monitor via aPTT until levels are correlated Continue to monitor H&H and platelets  Delmar Landau, PharmD, BCPS 08/25/2021 3:20 PM ED Clinical Pharmacist -  [phone]

## 2021-08-26 ENCOUNTER — Observation Stay (HOSPITAL_COMMUNITY): Payer: Medicare Other

## 2021-08-26 ENCOUNTER — Encounter (HOSPITAL_COMMUNITY): Payer: Self-pay | Admitting: Internal Medicine

## 2021-08-26 ENCOUNTER — Inpatient Hospital Stay (HOSPITAL_COMMUNITY)
Admission: EM | Disposition: A | Discharge status: Home / Self Care | Payer: Self-pay | Source: Home / Self Care | Attending: Surgery

## 2021-08-26 ENCOUNTER — Observation Stay (HOSPITAL_COMMUNITY): Payer: Medicare Other | Admitting: Certified Registered"

## 2021-08-26 ENCOUNTER — Encounter (HOSPITAL_COMMUNITY)
Admission: EM | Disposition: A | Discharge status: Home / Self Care | Payer: Self-pay | Source: Home / Self Care | Attending: Surgery

## 2021-08-26 ENCOUNTER — Inpatient Hospital Stay (HOSPITAL_COMMUNITY): Payer: Medicare Other

## 2021-08-26 ENCOUNTER — Ambulatory Visit (HOSPITAL_COMMUNITY): Admission: RE | Admit: 2021-08-26 | Payer: Medicare Other | Source: Home / Self Care | Admitting: Cardiology

## 2021-08-26 DIAGNOSIS — Z7984 Long term (current) use of oral hypoglycemic drugs: Secondary | ICD-10-CM | POA: Diagnosis not present

## 2021-08-26 DIAGNOSIS — Z8249 Family history of ischemic heart disease and other diseases of the circulatory system: Secondary | ICD-10-CM | POA: Diagnosis not present

## 2021-08-26 DIAGNOSIS — Z833 Family history of diabetes mellitus: Secondary | ICD-10-CM | POA: Diagnosis not present

## 2021-08-26 DIAGNOSIS — Z90710 Acquired absence of both cervix and uterus: Secondary | ICD-10-CM | POA: Diagnosis not present

## 2021-08-26 DIAGNOSIS — I200 Unstable angina: Secondary | ICD-10-CM | POA: Diagnosis present

## 2021-08-26 DIAGNOSIS — I25110 Atherosclerotic heart disease of native coronary artery with unstable angina pectoris: Secondary | ICD-10-CM | POA: Diagnosis present

## 2021-08-26 DIAGNOSIS — J9811 Atelectasis: Secondary | ICD-10-CM | POA: Diagnosis not present

## 2021-08-26 DIAGNOSIS — E8770 Fluid overload, unspecified: Secondary | ICD-10-CM | POA: Diagnosis not present

## 2021-08-26 DIAGNOSIS — I480 Paroxysmal atrial fibrillation: Secondary | ICD-10-CM | POA: Diagnosis present

## 2021-08-26 DIAGNOSIS — I441 Atrioventricular block, second degree: Secondary | ICD-10-CM | POA: Diagnosis present

## 2021-08-26 DIAGNOSIS — Z801 Family history of malignant neoplasm of trachea, bronchus and lung: Secondary | ICD-10-CM | POA: Diagnosis not present

## 2021-08-26 DIAGNOSIS — E119 Type 2 diabetes mellitus without complications: Secondary | ICD-10-CM | POA: Diagnosis present

## 2021-08-26 DIAGNOSIS — Z79899 Other long term (current) drug therapy: Secondary | ICD-10-CM | POA: Diagnosis not present

## 2021-08-26 DIAGNOSIS — K219 Gastro-esophageal reflux disease without esophagitis: Secondary | ICD-10-CM | POA: Diagnosis present

## 2021-08-26 DIAGNOSIS — Z950 Presence of cardiac pacemaker: Secondary | ICD-10-CM | POA: Diagnosis not present

## 2021-08-26 DIAGNOSIS — I10 Essential (primary) hypertension: Secondary | ICD-10-CM | POA: Diagnosis present

## 2021-08-26 DIAGNOSIS — Z7901 Long term (current) use of anticoagulants: Secondary | ICD-10-CM | POA: Diagnosis not present

## 2021-08-26 DIAGNOSIS — E7800 Pure hypercholesterolemia, unspecified: Secondary | ICD-10-CM | POA: Diagnosis present

## 2021-08-26 DIAGNOSIS — Z20822 Contact with and (suspected) exposure to covid-19: Secondary | ICD-10-CM | POA: Diagnosis present

## 2021-08-26 DIAGNOSIS — Z9049 Acquired absence of other specified parts of digestive tract: Secondary | ICD-10-CM | POA: Diagnosis not present

## 2021-08-26 DIAGNOSIS — Z791 Long term (current) use of non-steroidal anti-inflammatories (NSAID): Secondary | ICD-10-CM | POA: Diagnosis not present

## 2021-08-26 DIAGNOSIS — Z9861 Coronary angioplasty status: Secondary | ICD-10-CM | POA: Diagnosis not present

## 2021-08-26 HISTORY — PX: ENDOVEIN HARVEST OF GREATER SAPHENOUS VEIN: SHX5059

## 2021-08-26 HISTORY — PX: CORONARY ARTERY BYPASS GRAFT: SHX141

## 2021-08-26 HISTORY — PX: LEFT HEART CATH AND CORONARY ANGIOGRAPHY: CATH118249

## 2021-08-26 HISTORY — PX: TEE WITHOUT CARDIOVERSION: SHX5443

## 2021-08-26 LAB — CBC WITH DIFFERENTIAL/PLATELET
Abs Immature Granulocytes: 0.04 10*3/uL (ref 0.00–0.07)
Basophils Absolute: 0 10*3/uL (ref 0.0–0.1)
Basophils Relative: 0 %
Eosinophils Absolute: 0 10*3/uL (ref 0.0–0.5)
Eosinophils Relative: 0 %
HCT: 39.5 % (ref 36.0–46.0)
Hemoglobin: 12.7 g/dL (ref 12.0–15.0)
Immature Granulocytes: 0 %
Lymphocytes Relative: 9 %
Lymphs Abs: 0.8 10*3/uL (ref 0.7–4.0)
MCH: 31.8 pg (ref 26.0–34.0)
MCHC: 32.2 g/dL (ref 30.0–36.0)
MCV: 98.8 fL (ref 80.0–100.0)
Monocytes Absolute: 0.6 10*3/uL (ref 0.1–1.0)
Monocytes Relative: 7 %
Neutro Abs: 7.5 10*3/uL (ref 1.7–7.7)
Neutrophils Relative %: 84 %
Platelets: 267 10*3/uL (ref 150–400)
RBC: 4 MIL/uL (ref 3.87–5.11)
RDW: 14.5 % (ref 11.5–15.5)
WBC: 9 10*3/uL (ref 4.0–10.5)
nRBC: 0 % (ref 0.0–0.2)

## 2021-08-26 LAB — TYPE AND SCREEN
ABO/RH(D): A POS
Antibody Screen: NEGATIVE

## 2021-08-26 LAB — POCT I-STAT 7, (LYTES, BLD GAS, ICA,H+H)
Acid-Base Excess: 1 mmol/L (ref 0.0–2.0)
Acid-Base Excess: 3 mmol/L — ABNORMAL HIGH (ref 0.0–2.0)
Acid-Base Excess: 1 mmol/L (ref 0.0–2.0)
Acid-Base Excess: 1 mmol/L (ref 0.0–2.0)
Acid-base deficit: 1 mmol/L (ref 0.0–2.0)
Acid-base deficit: 2 mmol/L (ref 0.0–2.0)
Acid-base deficit: 4 mmol/L — ABNORMAL HIGH (ref 0.0–2.0)
Acid-base deficit: 4 mmol/L — ABNORMAL HIGH (ref 0.0–2.0)
Acid-base deficit: 3 mmol/L — ABNORMAL HIGH (ref 0.0–2.0)
Acid-base deficit: 3 mmol/L — ABNORMAL HIGH (ref 0.0–2.0)
Bicarbonate: 25.1 mmol/L (ref 20.0–28.0)
Bicarbonate: 27.7 mmol/L (ref 20.0–28.0)
Bicarbonate: 23.7 mmol/L (ref 20.0–28.0)
Bicarbonate: 24.8 mmol/L (ref 20.0–28.0)
Bicarbonate: 24.9 mmol/L (ref 20.0–28.0)
Bicarbonate: 25 mmol/L (ref 20.0–28.0)
Bicarbonate: 23.9 mmol/L (ref 20.0–28.0)
Bicarbonate: 24.4 mmol/L (ref 20.0–28.0)
Bicarbonate: 23.9 mmol/L (ref 20.0–28.0)
Bicarbonate: 23.2 mmol/L (ref 20.0–28.0)
Calcium, Ion: 1.18 mmol/L (ref 1.15–1.40)
Calcium, Ion: 0.96 mmol/L — ABNORMAL LOW (ref 1.15–1.40)
Calcium, Ion: 0.97 mmol/L — ABNORMAL LOW (ref 1.15–1.40)
Calcium, Ion: 1.01 mmol/L — ABNORMAL LOW (ref 1.15–1.40)
Calcium, Ion: 1.02 mmol/L — ABNORMAL LOW (ref 1.15–1.40)
Calcium, Ion: 1.14 mmol/L — ABNORMAL LOW (ref 1.15–1.40)
Calcium, Ion: 1.14 mmol/L — ABNORMAL LOW (ref 1.15–1.40)
Calcium, Ion: 1.15 mmol/L (ref 1.15–1.40)
Calcium, Ion: 1.15 mmol/L (ref 1.15–1.40)
Calcium, Ion: 1.17 mmol/L (ref 1.15–1.40)
HCT: 38 % (ref 36.0–46.0)
HCT: 28 % — ABNORMAL LOW (ref 36.0–46.0)
HCT: 26 % — ABNORMAL LOW (ref 36.0–46.0)
HCT: 26 % — ABNORMAL LOW (ref 36.0–46.0)
HCT: 27 % — ABNORMAL LOW (ref 36.0–46.0)
HCT: 31 % — ABNORMAL LOW (ref 36.0–46.0)
HCT: 33 % — ABNORMAL LOW (ref 36.0–46.0)
HCT: 32 % — ABNORMAL LOW (ref 36.0–46.0)
HCT: 31 % — ABNORMAL LOW (ref 36.0–46.0)
HCT: 29 % — ABNORMAL LOW (ref 36.0–46.0)
Hemoglobin: 12.9 g/dL (ref 12.0–15.0)
Hemoglobin: 9.5 g/dL — ABNORMAL LOW (ref 12.0–15.0)
Hemoglobin: 8.8 g/dL — ABNORMAL LOW (ref 12.0–15.0)
Hemoglobin: 8.8 g/dL — ABNORMAL LOW (ref 12.0–15.0)
Hemoglobin: 9.2 g/dL — ABNORMAL LOW (ref 12.0–15.0)
Hemoglobin: 10.5 g/dL — ABNORMAL LOW (ref 12.0–15.0)
Hemoglobin: 11.2 g/dL — ABNORMAL LOW (ref 12.0–15.0)
Hemoglobin: 10.9 g/dL — ABNORMAL LOW (ref 12.0–15.0)
Hemoglobin: 10.5 g/dL — ABNORMAL LOW (ref 12.0–15.0)
Hemoglobin: 9.9 g/dL — ABNORMAL LOW (ref 12.0–15.0)
O2 Saturation: 96 %
O2 Saturation: 100 %
O2 Saturation: 100 %
O2 Saturation: 100 %
O2 Saturation: 100 %
O2 Saturation: 93 %
O2 Saturation: 88 %
O2 Saturation: 92 %
O2 Saturation: 96 %
O2 Saturation: 96 %
Patient temperature: 35.3
Patient temperature: 37
Patient temperature: 37.4
Patient temperature: 37.5
Patient temperature: 37.6
Potassium: 3.6 mmol/L (ref 3.5–5.1)
Potassium: 4.7 mmol/L (ref 3.5–5.1)
Potassium: 4 mmol/L (ref 3.5–5.1)
Potassium: 4.2 mmol/L (ref 3.5–5.1)
Potassium: 4.8 mmol/L (ref 3.5–5.1)
Potassium: 3.5 mmol/L (ref 3.5–5.1)
Potassium: 3.9 mmol/L (ref 3.5–5.1)
Potassium: 4.1 mmol/L (ref 3.5–5.1)
Potassium: 4.1 mmol/L (ref 3.5–5.1)
Potassium: 3.8 mmol/L (ref 3.5–5.1)
Sodium: 138 mmol/L (ref 135–145)
Sodium: 144 mmol/L (ref 135–145)
Sodium: 139 mmol/L (ref 135–145)
Sodium: 139 mmol/L (ref 135–145)
Sodium: 140 mmol/L (ref 135–145)
Sodium: 143 mmol/L (ref 135–145)
Sodium: 143 mmol/L (ref 135–145)
Sodium: 144 mmol/L (ref 135–145)
Sodium: 142 mmol/L (ref 135–145)
Sodium: 142 mmol/L (ref 135–145)
TCO2: 26 mmol/L (ref 22–32)
TCO2: 29 mmol/L (ref 22–32)
TCO2: 25 mmol/L (ref 22–32)
TCO2: 26 mmol/L (ref 22–32)
TCO2: 26 mmol/L (ref 22–32)
TCO2: 27 mmol/L (ref 22–32)
TCO2: 26 mmol/L (ref 22–32)
TCO2: 26 mmol/L (ref 22–32)
TCO2: 25 mmol/L (ref 22–32)
TCO2: 25 mmol/L (ref 22–32)
pCO2 arterial: 39.1 mmHg (ref 32.0–48.0)
pCO2 arterial: 42 mmHg (ref 32.0–48.0)
pCO2 arterial: 33.3 mmHg (ref 32.0–48.0)
pCO2 arterial: 35.8 mmHg (ref 32.0–48.0)
pCO2 arterial: 36.5 mmHg (ref 32.0–48.0)
pCO2 arterial: 48.6 mmHg — ABNORMAL HIGH (ref 32.0–48.0)
pCO2 arterial: 56.4 mmHg — ABNORMAL HIGH (ref 32.0–48.0)
pCO2 arterial: 57.9 mmHg — ABNORMAL HIGH (ref 32.0–48.0)
pCO2 arterial: 50.9 mmHg — ABNORMAL HIGH (ref 32.0–48.0)
pCO2 arterial: 45.7 mmHg (ref 32.0–48.0)
pH, Arterial: 7.416 (ref 7.350–7.450)
pH, Arterial: 7.427 (ref 7.350–7.450)
pH, Arterial: 7.421 (ref 7.350–7.450)
pH, Arterial: 7.451 — ABNORMAL HIGH (ref 7.350–7.450)
pH, Arterial: 7.48 — ABNORMAL HIGH (ref 7.350–7.450)
pH, Arterial: 7.311 — ABNORMAL LOW (ref 7.350–7.450)
pH, Arterial: 7.235 — ABNORMAL LOW (ref 7.350–7.450)
pH, Arterial: 7.234 — ABNORMAL LOW (ref 7.350–7.450)
pH, Arterial: 7.282 — ABNORMAL LOW (ref 7.350–7.450)
pH, Arterial: 7.316 — ABNORMAL LOW (ref 7.350–7.450)
pO2, Arterial: 83 mmHg (ref 83.0–108.0)
pO2, Arterial: 414 mmHg — ABNORMAL HIGH (ref 83.0–108.0)
pO2, Arterial: 217 mmHg — ABNORMAL HIGH (ref 83.0–108.0)
pO2, Arterial: 240 mmHg — ABNORMAL HIGH (ref 83.0–108.0)
pO2, Arterial: 305 mmHg — ABNORMAL HIGH (ref 83.0–108.0)
pO2, Arterial: 66 mmHg — ABNORMAL LOW (ref 83.0–108.0)
pO2, Arterial: 65 mmHg — ABNORMAL LOW (ref 83.0–108.0)
pO2, Arterial: 80 mmHg — ABNORMAL LOW (ref 83.0–108.0)
pO2, Arterial: 96 mmHg (ref 83.0–108.0)
pO2, Arterial: 93 mmHg (ref 83.0–108.0)

## 2021-08-26 LAB — GLUCOSE, CAPILLARY
Glucose-Capillary: 116 mg/dL — ABNORMAL HIGH (ref 70–99)
Glucose-Capillary: 133 mg/dL — ABNORMAL HIGH (ref 70–99)
Glucose-Capillary: 139 mg/dL — ABNORMAL HIGH (ref 70–99)
Glucose-Capillary: 122 mg/dL — ABNORMAL HIGH (ref 70–99)
Glucose-Capillary: 140 mg/dL — ABNORMAL HIGH (ref 70–99)

## 2021-08-26 LAB — LIPID PANEL
Cholesterol: 141 mg/dL (ref 0–200)
HDL: 51 mg/dL (ref >40)
LDL Cholesterol: 68 mg/dL (ref 0–99)
Total CHOL/HDL Ratio: 2.8 RATIO
Triglycerides: 108 mg/dL (ref <150)
VLDL: 22 mg/dL (ref 0–40)

## 2021-08-26 LAB — CBC
HCT: 40.6 % (ref 36.0–46.0)
HCT: 31.9 % — ABNORMAL LOW (ref 36.0–46.0)
HCT: 32.4 % — ABNORMAL LOW (ref 36.0–46.0)
Hemoglobin: 13 g/dL (ref 12.0–15.0)
Hemoglobin: 10.2 g/dL — ABNORMAL LOW (ref 12.0–15.0)
Hemoglobin: 10.1 g/dL — ABNORMAL LOW (ref 12.0–15.0)
MCH: 26.9 pg (ref 26.0–34.0)
MCH: 26.9 pg (ref 26.0–34.0)
MCH: 26.4 pg (ref 26.0–34.0)
MCHC: 32 g/dL (ref 30.0–36.0)
MCHC: 32 g/dL (ref 30.0–36.0)
MCHC: 31.2 g/dL (ref 30.0–36.0)
MCV: 83.9 fL (ref 80.0–100.0)
MCV: 84.2 fL (ref 80.0–100.0)
MCV: 84.8 fL (ref 80.0–100.0)
Platelets: 209 10*3/uL (ref 150–400)
Platelets: 130 10*3/uL — ABNORMAL LOW (ref 150–400)
Platelets: 196 10*3/uL (ref 150–400)
RBC: 4.84 MIL/uL (ref 3.87–5.11)
RBC: 3.79 MIL/uL — ABNORMAL LOW (ref 3.87–5.11)
RBC: 3.82 MIL/uL — ABNORMAL LOW (ref 3.87–5.11)
RDW: 13.9 % (ref 11.5–15.5)
RDW: 13.9 % (ref 11.5–15.5)
RDW: 14.2 % (ref 11.5–15.5)
WBC: 7.1 10*3/uL (ref 4.0–10.5)
WBC: 9.7 10*3/uL (ref 4.0–10.5)
WBC: 17.9 10*3/uL — ABNORMAL HIGH (ref 4.0–10.5)
nRBC: 0 % (ref 0.0–0.2)
nRBC: 0 % (ref 0.0–0.2)
nRBC: 0 % (ref 0.0–0.2)

## 2021-08-26 LAB — APTT
aPTT: 70 seconds — ABNORMAL HIGH (ref 24–36)
aPTT: 32 seconds (ref 24–36)

## 2021-08-26 LAB — HEPATIC FUNCTION PANEL
ALT: 17 U/L (ref 0–44)
AST: 27 U/L (ref 15–41)
Albumin: 3.4 g/dL — ABNORMAL LOW (ref 3.5–5.0)
Alkaline Phosphatase: 46 U/L (ref 38–126)
Bilirubin, Direct: <0.1 mg/dL (ref 0.0–0.2)
Total Bilirubin: 0.4 mg/dL (ref 0.3–1.2)
Total Protein: 6.1 g/dL — ABNORMAL LOW (ref 6.5–8.1)

## 2021-08-26 LAB — BASIC METABOLIC PANEL
Anion gap: 10 (ref 5–15)
Anion gap: 6 (ref 5–15)
BUN: 17 mg/dL (ref 8–23)
BUN: 10 mg/dL (ref 8–23)
CO2: 24 mmol/L (ref 22–32)
CO2: 23 mmol/L (ref 22–32)
Calcium: 8.9 mg/dL (ref 8.9–10.3)
Calcium: 7.5 mg/dL — ABNORMAL LOW (ref 8.9–10.3)
Chloride: 101 mmol/L (ref 98–111)
Chloride: 111 mmol/L (ref 98–111)
Creatinine, Ser: 0.74 mg/dL (ref 0.44–1.00)
Creatinine, Ser: 0.74 mg/dL (ref 0.44–1.00)
GFR, Estimated: >60 mL/min (ref >60)
GFR, Estimated: >60 mL/min (ref >60)
Glucose, Bld: 133 mg/dL — ABNORMAL HIGH (ref 70–99)
Glucose, Bld: 122 mg/dL — ABNORMAL HIGH (ref 70–99)
Potassium: 3.5 mmol/L (ref 3.5–5.1)
Potassium: 4.1 mmol/L (ref 3.5–5.1)
Sodium: 135 mmol/L (ref 135–145)
Sodium: 140 mmol/L (ref 135–145)

## 2021-08-26 LAB — CK: Total CK: 30 U/L — ABNORMAL LOW (ref 38–234)

## 2021-08-26 LAB — POCT I-STAT, CHEM 8
BUN: 15 mg/dL (ref 8–23)
BUN: 14 mg/dL (ref 8–23)
BUN: 12 mg/dL (ref 8–23)
BUN: 13 mg/dL (ref 8–23)
Calcium, Ion: 1.22 mmol/L (ref 1.15–1.40)
Calcium, Ion: 1.2 mmol/L (ref 1.15–1.40)
Calcium, Ion: 0.97 mmol/L — ABNORMAL LOW (ref 1.15–1.40)
Calcium, Ion: 1 mmol/L — ABNORMAL LOW (ref 1.15–1.40)
Chloride: 102 mmol/L (ref 98–111)
Chloride: 104 mmol/L (ref 98–111)
Chloride: 103 mmol/L (ref 98–111)
Chloride: 104 mmol/L (ref 98–111)
Creatinine, Ser: 0.6 mg/dL (ref 0.44–1.00)
Creatinine, Ser: 0.6 mg/dL (ref 0.44–1.00)
Creatinine, Ser: 0.5 mg/dL (ref 0.44–1.00)
Creatinine, Ser: 0.5 mg/dL (ref 0.44–1.00)
Glucose, Bld: 157 mg/dL — ABNORMAL HIGH (ref 70–99)
Glucose, Bld: 126 mg/dL — ABNORMAL HIGH (ref 70–99)
Glucose, Bld: 112 mg/dL — ABNORMAL HIGH (ref 70–99)
Glucose, Bld: 93 mg/dL (ref 70–99)
HCT: 36 % (ref 36.0–46.0)
HCT: 37 % (ref 36.0–46.0)
HCT: 26 % — ABNORMAL LOW (ref 36.0–46.0)
HCT: 27 % — ABNORMAL LOW (ref 36.0–46.0)
Hemoglobin: 12.2 g/dL (ref 12.0–15.0)
Hemoglobin: 12.6 g/dL (ref 12.0–15.0)
Hemoglobin: 8.8 g/dL — ABNORMAL LOW (ref 12.0–15.0)
Hemoglobin: 9.2 g/dL — ABNORMAL LOW (ref 12.0–15.0)
Potassium: 3.5 mmol/L (ref 3.5–5.1)
Potassium: 3.4 mmol/L — ABNORMAL LOW (ref 3.5–5.1)
Potassium: 4 mmol/L (ref 3.5–5.1)
Potassium: 4.1 mmol/L (ref 3.5–5.1)
Sodium: 140 mmol/L (ref 135–145)
Sodium: 141 mmol/L (ref 135–145)
Sodium: 138 mmol/L (ref 135–145)
Sodium: 139 mmol/L (ref 135–145)
TCO2: 24 mmol/L (ref 22–32)
TCO2: 24 mmol/L (ref 22–32)
TCO2: 24 mmol/L (ref 22–32)
TCO2: 24 mmol/L (ref 22–32)

## 2021-08-26 LAB — HEPARIN LEVEL (UNFRACTIONATED): Heparin Unfractionated: 0.89 IU/mL — ABNORMAL HIGH (ref 0.30–0.70)

## 2021-08-26 LAB — POCT I-STAT EG7
Acid-base deficit: 1 mmol/L (ref 0.0–2.0)
Bicarbonate: 24.4 mmol/L (ref 20.0–28.0)
Calcium, Ion: 1.01 mmol/L — ABNORMAL LOW (ref 1.15–1.40)
HCT: 29 % — ABNORMAL LOW (ref 36.0–46.0)
Hemoglobin: 9.9 g/dL — ABNORMAL LOW (ref 12.0–15.0)
O2 Saturation: 85 %
Potassium: 4.3 mmol/L (ref 3.5–5.1)
Sodium: 144 mmol/L (ref 135–145)
TCO2: 26 mmol/L (ref 22–32)
pCO2, Ven: 40.5 mmHg — ABNORMAL LOW (ref 44.0–60.0)
pH, Ven: 7.388 (ref 7.250–7.430)
pO2, Ven: 51 mmHg — ABNORMAL HIGH (ref 32.0–45.0)

## 2021-08-26 LAB — MAGNESIUM
Magnesium: 1.9 mg/dL (ref 1.7–2.4)
Magnesium: 3.1 mg/dL — ABNORMAL HIGH (ref 1.7–2.4)

## 2021-08-26 LAB — HEMOGLOBIN AND HEMATOCRIT, BLOOD
HCT: 27 % — ABNORMAL LOW (ref 36.0–46.0)
Hemoglobin: 9.1 g/dL — ABNORMAL LOW (ref 12.0–15.0)

## 2021-08-26 LAB — CBG MONITORING, ED: Glucose-Capillary: 144 mg/dL — ABNORMAL HIGH (ref 70–99)

## 2021-08-26 LAB — MRSA NEXT GEN BY PCR, NASAL: MRSA by PCR Next Gen: NOT DETECTED

## 2021-08-26 LAB — ECHO INTRAOPERATIVE TEE
AV Mean grad: 6 mmHg
Height: 65 in
Weight: 2934.4 oz

## 2021-08-26 LAB — PROTIME-INR
INR: 1.5 — ABNORMAL HIGH (ref 0.8–1.2)
Prothrombin Time: 17.9 seconds — ABNORMAL HIGH (ref 11.4–15.2)

## 2021-08-26 LAB — PHOSPHORUS: Phosphorus: 4 mg/dL (ref 2.5–4.6)

## 2021-08-26 LAB — PLATELET COUNT: Platelets: 167 10*3/uL (ref 150–400)

## 2021-08-26 LAB — TROPONIN I (HIGH SENSITIVITY): Troponin I (High Sensitivity): 8 ng/L (ref <18)

## 2021-08-26 SURGERY — LEFT HEART CATH AND CORONARY ANGIOGRAPHY
Anesthesia: LOCAL

## 2021-08-26 SURGERY — CORONARY ARTERY BYPASS GRAFTING (CABG)
Anesthesia: General | Site: Leg Upper | Laterality: Right

## 2021-08-26 MED ORDER — FENTANYL CITRATE (PF) 250 MCG/5ML IJ SOLN
INTRAMUSCULAR | Status: AC
  Filled 2021-08-26: qty 5

## 2021-08-26 MED ORDER — FENTANYL CITRATE (PF) 250 MCG/5ML IJ SOLN
INTRAMUSCULAR | Status: DC | PRN
  Administered 2021-08-26: 150 ug via INTRAVENOUS
  Administered 2021-08-26 (×2): 100 ug via INTRAVENOUS
  Administered 2021-08-26: 250 ug via INTRAVENOUS
  Administered 2021-08-26: 50 ug via INTRAVENOUS
  Administered 2021-08-26: 100 ug via INTRAVENOUS
  Administered 2021-08-26: 450 ug via INTRAVENOUS
  Administered 2021-08-26: 100 ug via INTRAVENOUS
  Administered 2021-08-26: 150 ug via INTRAVENOUS
  Administered 2021-08-26: 100 ug via INTRAVENOUS
  Administered 2021-08-26: 50 ug via INTRAVENOUS
  Administered 2021-08-26: 150 ug via INTRAVENOUS

## 2021-08-26 MED ORDER — SODIUM CHLORIDE 0.9% FLUSH
3.0000 mL | Freq: Two times a day (BID) | INTRAVENOUS | Status: DC
  Administered 2021-08-27: 3 mL via INTRAVENOUS

## 2021-08-26 MED ORDER — ASPIRIN 81 MG PO CHEW: 324.0000 mg | CHEWABLE_TABLET | Freq: Every day | ORAL | Status: DC

## 2021-08-26 MED ORDER — MIDAZOLAM HCL 2 MG/2ML IJ SOLN: 2.0000 mg | INTRAMUSCULAR | Status: DC | PRN

## 2021-08-26 MED ORDER — PROPOFOL 10 MG/ML IV BOLUS
INTRAVENOUS | Status: AC
  Filled 2021-08-26: qty 20

## 2021-08-26 MED ORDER — NITROGLYCERIN IN D5W 200-5 MCG/ML-% IV SOLN
INTRAVENOUS | Status: DC | PRN
  Administered 2021-08-26: 5 ug/min via INTRAVENOUS

## 2021-08-26 MED ORDER — ARTIFICIAL TEARS OPHTHALMIC OINT
TOPICAL_OINTMENT | OPHTHALMIC | Status: AC
  Filled 2021-08-26: qty 3.5

## 2021-08-26 MED ORDER — MIDAZOLAM HCL (PF) 5 MG/ML IJ SOLN
INTRAMUSCULAR | Status: DC | PRN
  Administered 2021-08-26: 1 mg via INTRAVENOUS
  Administered 2021-08-26: 3 mg via INTRAVENOUS
  Administered 2021-08-26: 1 mg via INTRAVENOUS
  Administered 2021-08-26: 5 mg via INTRAVENOUS

## 2021-08-26 MED ORDER — ACETAMINOPHEN 650 MG RE SUPP
650.0000 mg | Freq: Once | RECTAL | Status: AC
  Administered 2021-08-26: 16:00:00 650 mg via RECTAL

## 2021-08-26 MED ORDER — INSULIN REGULAR(HUMAN) IN NACL 100-0.9 UT/100ML-% IV SOLN
INTRAVENOUS | Status: DC
  Administered 2021-08-26: 16:00:00 2 [IU]/h via INTRAVENOUS

## 2021-08-26 MED ORDER — LACTATED RINGERS IV SOLN: INTRAVENOUS | Status: DC | PRN

## 2021-08-26 MED ORDER — SODIUM CHLORIDE 0.9 % IV SOLN
INTRAVENOUS | Status: DC | PRN
Start: 2021-08-26 — End: 2021-08-26

## 2021-08-26 MED ORDER — ALBUMIN HUMAN 5 % IV SOLN
250.0000 mL | INTRAVENOUS | Status: DC | PRN
  Administered 2021-08-26: 19:00:00 12.5 g via INTRAVENOUS

## 2021-08-26 MED ORDER — TRANEXAMIC ACID (OHS) PUMP PRIME SOLUTION
2.0000 mg/kg | INTRAVENOUS | Status: DC
  Filled 2021-08-26: qty 1.66

## 2021-08-26 MED ORDER — EPHEDRINE 5 MG/ML INJ
INTRAVENOUS | Status: AC
  Filled 2021-08-26: qty 5

## 2021-08-26 MED ORDER — CHLORHEXIDINE GLUCONATE 0.12 % MT SOLN: 15.0000 mL | Freq: Once | OROMUCOSAL | Status: DC

## 2021-08-26 MED ORDER — PROTAMINE SULFATE 10 MG/ML IV SOLN
INTRAVENOUS | Status: DC | PRN
  Administered 2021-08-26: 250 mg via INTRAVENOUS

## 2021-08-26 MED ORDER — CEFAZOLIN SODIUM-DEXTROSE 2-4 GM/100ML-% IV SOLN
2.0000 g | INTRAVENOUS | Status: AC
  Administered 2021-08-26 (×2): 2 g via INTRAVENOUS
  Filled 2021-08-26: qty 100

## 2021-08-26 MED ORDER — SODIUM CHLORIDE 0.9 % IV SOLN: INTRAVENOUS | Status: AC

## 2021-08-26 MED ORDER — PANTOPRAZOLE SODIUM 40 MG PO TBEC
40.0000 mg | DELAYED_RELEASE_TABLET | Freq: Every day | ORAL | Status: DC
  Administered 2021-08-28 – 2021-08-30 (×3): 40 mg via ORAL
  Filled 2021-08-26 (×3): qty 1

## 2021-08-26 MED ORDER — CEFAZOLIN SODIUM-DEXTROSE 2-4 GM/100ML-% IV SOLN
2.0000 g | Freq: Three times a day (TID) | INTRAVENOUS | Status: AC
  Administered 2021-08-26 – 2021-08-28 (×6): 2 g via INTRAVENOUS
  Filled 2021-08-26 (×6): qty 100

## 2021-08-26 MED ORDER — CHLORHEXIDINE GLUCONATE CLOTH 2 % EX PADS
6.0000 | MEDICATED_PAD | Freq: Every day | CUTANEOUS | Status: DC
  Administered 2021-08-26 – 2021-08-27 (×2): 6 via TOPICAL

## 2021-08-26 MED ORDER — VANCOMYCIN HCL IN DEXTROSE 1-5 GM/200ML-% IV SOLN
1000.0000 mg | Freq: Once | INTRAVENOUS | Status: AC
  Administered 2021-08-26: 22:00:00 1000 mg via INTRAVENOUS
  Filled 2021-08-26: qty 200

## 2021-08-26 MED ORDER — ASPIRIN 81 MG PO CHEW
81.0000 mg | CHEWABLE_TABLET | ORAL | Status: AC
  Administered 2021-08-26: 06:00:00 81 mg via ORAL
  Filled 2021-08-26: qty 1

## 2021-08-26 MED ORDER — IOHEXOL 350 MG/ML SOLN
INTRAVENOUS | Status: DC | PRN
  Administered 2021-08-26: 10:00:00 50 mL

## 2021-08-26 MED ORDER — MILRINONE LACTATE IN DEXTROSE 20-5 MG/100ML-% IV SOLN
0.3000 ug/kg/min | INTRAVENOUS | Status: DC
  Filled 2021-08-26: qty 100

## 2021-08-26 MED ORDER — CEFAZOLIN SODIUM-DEXTROSE 2-4 GM/100ML-% IV SOLN
2.0000 g | INTRAVENOUS | Status: DC
  Filled 2021-08-26: qty 100

## 2021-08-26 MED ORDER — EPINEPHRINE HCL 5 MG/250ML IV SOLN IN NS
0.0000 ug/min | INTRAVENOUS | Status: DC
  Filled 2021-08-26: qty 250

## 2021-08-26 MED ORDER — TRAMADOL HCL 50 MG PO TABS
50.0000 mg | ORAL_TABLET | ORAL | Status: DC | PRN
  Administered 2021-08-27: 50 mg via ORAL
  Filled 2021-08-26: qty 1

## 2021-08-26 MED ORDER — BISACODYL 10 MG RE SUPP: 10.0000 mg | Freq: Every day | RECTAL | Status: DC

## 2021-08-26 MED ORDER — VERAPAMIL HCL 2.5 MG/ML IV SOLN
INTRAVENOUS | Status: DC | PRN
  Administered 2021-08-26: 09:00:00 10 mL via INTRA_ARTERIAL

## 2021-08-26 MED ORDER — NOREPINEPHRINE 4 MG/250ML-% IV SOLN
0.0000 ug/min | INTRAVENOUS | Status: DC
  Filled 2021-08-26: qty 250

## 2021-08-26 MED ORDER — LABETALOL HCL 5 MG/ML IV SOLN: 10.0000 mg | INTRAVENOUS | Status: AC | PRN

## 2021-08-26 MED ORDER — 0.9 % SODIUM CHLORIDE (POUR BTL) OPTIME
TOPICAL | Status: DC | PRN
  Administered 2021-08-26: 13:00:00 1000 mL
  Administered 2021-08-26: 12:00:00 4000 mL

## 2021-08-26 MED ORDER — SODIUM CHLORIDE 0.9 % WEIGHT BASED INFUSION
3.0000 mL/kg/h | INTRAVENOUS | Status: DC
  Administered 2021-08-26: 05:00:00 3 mL/kg/h via INTRAVENOUS

## 2021-08-26 MED ORDER — HEPARIN SODIUM (PORCINE) 1000 UNIT/ML IJ SOLN
INTRAMUSCULAR | Status: AC
  Filled 2021-08-26: qty 10

## 2021-08-26 MED ORDER — LIDOCAINE HCL (PF) 1 % IJ SOLN
INTRAMUSCULAR | Status: DC | PRN
  Administered 2021-08-26 (×2): 2 mL

## 2021-08-26 MED ORDER — ARTIFICIAL TEARS OPHTHALMIC OINT
TOPICAL_OINTMENT | OPHTHALMIC | Status: DC | PRN
  Administered 2021-08-26: 1 via OPHTHALMIC

## 2021-08-26 MED ORDER — TRANEXAMIC ACID 1000 MG/10ML IV SOLN
1.5000 mg/kg/h | INTRAVENOUS | Status: AC
  Administered 2021-08-26: 13:00:00 1.5 mg/kg/h via INTRAVENOUS
  Filled 2021-08-26: qty 25

## 2021-08-26 MED ORDER — DOCUSATE SODIUM 100 MG PO CAPS
200.0000 mg | ORAL_CAPSULE | Freq: Every day | ORAL | Status: DC
  Administered 2021-08-27: 200 mg via ORAL
  Filled 2021-08-26: qty 2

## 2021-08-26 MED ORDER — SODIUM CHLORIDE 0.9 % WEIGHT BASED INFUSION: 1.0000 mL/kg/h | INTRAVENOUS | Status: DC

## 2021-08-26 MED ORDER — MIDAZOLAM HCL 2 MG/2ML IJ SOLN
INTRAMUSCULAR | Status: AC
  Filled 2021-08-26: qty 2

## 2021-08-26 MED ORDER — ACETAMINOPHEN 160 MG/5ML PO SOLN: 650.0000 mg | Freq: Once | ORAL | Status: AC

## 2021-08-26 MED ORDER — MIDAZOLAM HCL 2 MG/2ML IJ SOLN
INTRAMUSCULAR | Status: DC | PRN
  Administered 2021-08-26 (×2): 1 mg via INTRAVENOUS

## 2021-08-26 MED ORDER — METOPROLOL TARTRATE 12.5 MG HALF TABLET: 12.5000 mg | ORAL_TABLET | Freq: Two times a day (BID) | ORAL | Status: DC

## 2021-08-26 MED ORDER — ASPIRIN 81 MG PO CHEW: 81.0000 mg | CHEWABLE_TABLET | ORAL | Status: DC

## 2021-08-26 MED ORDER — SODIUM CHLORIDE 0.9 % WEIGHT BASED INFUSION: 3.0000 mL/kg/h | INTRAVENOUS | Status: DC

## 2021-08-26 MED ORDER — TEMAZEPAM 15 MG PO CAPS: 15.0000 mg | ORAL_CAPSULE | Freq: Once | ORAL | Status: DC | PRN

## 2021-08-26 MED ORDER — LACTATED RINGERS IV SOLN: INTRAVENOUS | Status: DC

## 2021-08-26 MED ORDER — DEXTROSE 50 % IV SOLN: 0.0000 mL | INTRAVENOUS | Status: DC | PRN

## 2021-08-26 MED ORDER — SODIUM CHLORIDE 0.9 % IV SOLN: 250.0000 mL | INTRAVENOUS | Status: DC | PRN

## 2021-08-26 MED ORDER — CHLORHEXIDINE GLUCONATE CLOTH 2 % EX PADS: 6.0000 | MEDICATED_PAD | Freq: Once | CUTANEOUS | Status: DC

## 2021-08-26 MED ORDER — DEXMEDETOMIDINE HCL IN NACL 400 MCG/100ML IV SOLN: 0.0000 ug/kg/h | INTRAVENOUS | Status: DC

## 2021-08-26 MED ORDER — SODIUM CHLORIDE 0.9 % IV SOLN: INTRAVENOUS | Status: DC

## 2021-08-26 MED ORDER — NITROGLYCERIN IN D5W 200-5 MCG/ML-% IV SOLN
INTRAVENOUS | Status: AC
  Filled 2021-08-26: qty 250

## 2021-08-26 MED ORDER — VANCOMYCIN HCL 1250 MG/250ML IV SOLN
1250.0000 mg | INTRAVENOUS | Status: AC
  Administered 2021-08-26: 11:00:00 1250 mg via INTRAVENOUS
  Filled 2021-08-26: qty 250

## 2021-08-26 MED ORDER — PHENYLEPHRINE HCL-NACL 20-0.9 MG/250ML-% IV SOLN
30.0000 ug/min | INTRAVENOUS | Status: AC
Start: 2021-08-27 — End: 2021-08-26
  Administered 2021-08-26: 11:00:00 30 ug/min via INTRAVENOUS
  Filled 2021-08-26: qty 250

## 2021-08-26 MED ORDER — PLASMA-LYTE A IV SOLN
INTRAVENOUS | Status: DC | PRN
  Administered 2021-08-26: 12:00:00 1000 mL via INTRAVASCULAR

## 2021-08-26 MED ORDER — MAGNESIUM SULFATE 4 GM/100ML IV SOLN
4.0000 g | Freq: Once | INTRAVENOUS | Status: AC
  Administered 2021-08-26: 16:00:00 4 g via INTRAVENOUS
  Filled 2021-08-26: qty 100

## 2021-08-26 MED ORDER — NITROGLYCERIN IN D5W 200-5 MCG/ML-% IV SOLN
2.0000 ug/min | INTRAVENOUS | Status: AC
  Administered 2021-08-26: 11:00:00 5 ug/min via INTRAVENOUS
  Filled 2021-08-26: qty 250

## 2021-08-26 MED ORDER — HEPARIN (PORCINE) IN NACL 2000-0.9 UNIT/L-% IV SOLN
INTRAVENOUS | Status: DC | PRN
  Administered 2021-08-26: 1000 mL

## 2021-08-26 MED ORDER — POTASSIUM CHLORIDE 2 MEQ/ML IV SOLN
80.0000 meq | INTRAVENOUS | Status: DC
  Filled 2021-08-26: qty 40

## 2021-08-26 MED ORDER — PHENYLEPHRINE HCL-NACL 20-0.9 MG/250ML-% IV SOLN: 0.0000 ug/min | INTRAVENOUS | Status: DC

## 2021-08-26 MED ORDER — PHENYLEPHRINE 40 MCG/ML (10ML) SYRINGE FOR IV PUSH (FOR BLOOD PRESSURE SUPPORT)
PREFILLED_SYRINGE | INTRAVENOUS | Status: AC
  Filled 2021-08-26: qty 10

## 2021-08-26 MED ORDER — VERAPAMIL HCL 2.5 MG/ML IV SOLN
INTRAVENOUS | Status: AC
  Filled 2021-08-26: qty 2

## 2021-08-26 MED ORDER — METOPROLOL TARTRATE 5 MG/5ML IV SOLN: 2.5000 mg | INTRAVENOUS | Status: DC | PRN

## 2021-08-26 MED ORDER — PHENYLEPHRINE 40 MCG/ML (10ML) SYRINGE FOR IV PUSH (FOR BLOOD PRESSURE SUPPORT)
PREFILLED_SYRINGE | INTRAVENOUS | Status: DC | PRN
  Administered 2021-08-26: 40 ug via INTRAVENOUS
  Administered 2021-08-26: 20 ug via INTRAVENOUS
  Administered 2021-08-26: 80 ug via INTRAVENOUS
  Administered 2021-08-26: 40 ug via INTRAVENOUS

## 2021-08-26 MED ORDER — ORAL CARE MOUTH RINSE
15.0000 mL | OROMUCOSAL | Status: DC
  Administered 2021-08-26 – 2021-08-27 (×2): 15 mL via OROMUCOSAL

## 2021-08-26 MED ORDER — FENTANYL CITRATE (PF) 100 MCG/2ML IJ SOLN
INTRAMUSCULAR | Status: AC
  Filled 2021-08-26: qty 2

## 2021-08-26 MED ORDER — INSULIN REGULAR(HUMAN) IN NACL 100-0.9 UT/100ML-% IV SOLN
INTRAVENOUS | Status: AC
  Administered 2021-08-26: 12:00:00 4.8 [IU]/h via INTRAVENOUS
  Filled 2021-08-26: qty 100

## 2021-08-26 MED ORDER — MIDAZOLAM HCL (PF) 10 MG/2ML IJ SOLN
INTRAMUSCULAR | Status: AC
  Filled 2021-08-26: qty 2

## 2021-08-26 MED ORDER — CHLORHEXIDINE GLUCONATE 0.12% ORAL RINSE (MEDLINE KIT)
15.0000 mL | Freq: Two times a day (BID) | OROMUCOSAL | Status: DC
  Administered 2021-08-26: 20:00:00 15 mL via OROMUCOSAL

## 2021-08-26 MED ORDER — ALBUMIN HUMAN 5 % IV SOLN
INTRAVENOUS | Status: DC | PRN
  Administered 2021-08-26: 12.5 g via INTRAVENOUS

## 2021-08-26 MED ORDER — SODIUM CHLORIDE 0.9% FLUSH: 3.0000 mL | INTRAVENOUS | Status: DC | PRN

## 2021-08-26 MED ORDER — MAGNESIUM SULFATE 50 % IJ SOLN
40.0000 meq | INTRAMUSCULAR | Status: DC
  Filled 2021-08-26: qty 9.85

## 2021-08-26 MED ORDER — MORPHINE SULFATE (PF) 2 MG/ML IV SOLN
1.0000 mg | INTRAVENOUS | Status: DC | PRN
  Administered 2021-08-26: 18:00:00 4 mg via INTRAVENOUS
  Administered 2021-08-26 – 2021-08-27 (×2): 2 mg via INTRAVENOUS
  Filled 2021-08-26 (×2): qty 1
  Filled 2021-08-26: qty 2

## 2021-08-26 MED ORDER — SODIUM CHLORIDE 0.9 % IV SOLN: 250.0000 mL | INTRAVENOUS | Status: DC

## 2021-08-26 MED ORDER — BISACODYL 5 MG PO TBEC
10.0000 mg | DELAYED_RELEASE_TABLET | Freq: Every day | ORAL | Status: DC
  Administered 2021-08-27: 10 mg via ORAL
  Filled 2021-08-26: qty 2

## 2021-08-26 MED ORDER — HEPARIN SODIUM (PORCINE) 1000 UNIT/ML IJ SOLN
INTRAMUSCULAR | Status: DC | PRN
  Administered 2021-08-26: 4000 [IU] via INTRAVENOUS

## 2021-08-26 MED ORDER — METOPROLOL TARTRATE 12.5 MG HALF TABLET: 12.5000 mg | ORAL_TABLET | Freq: Once | ORAL | Status: DC

## 2021-08-26 MED ORDER — HEPARIN 30,000 UNITS/1000 ML (OHS) CELLSAVER SOLUTION
Status: DC
  Filled 2021-08-26: qty 1000

## 2021-08-26 MED ORDER — ROCURONIUM BROMIDE 10 MG/ML (PF) SYRINGE
PREFILLED_SYRINGE | INTRAVENOUS | Status: DC | PRN
  Administered 2021-08-26: 100 mg via INTRAVENOUS
  Administered 2021-08-26: 70 mg via INTRAVENOUS
  Administered 2021-08-26: 30 mg via INTRAVENOUS

## 2021-08-26 MED ORDER — FAMOTIDINE IN NACL 20-0.9 MG/50ML-% IV SOLN
20.0000 mg | Freq: Two times a day (BID) | INTRAVENOUS | Status: DC
  Administered 2021-08-26: 23:00:00 20 mg via INTRAVENOUS
  Filled 2021-08-26: qty 50

## 2021-08-26 MED ORDER — LACTATED RINGERS IV SOLN: 500.0000 mL | Freq: Once | INTRAVENOUS | Status: DC | PRN

## 2021-08-26 MED ORDER — PLASMA-LYTE A IV SOLN
INTRAVENOUS | Status: DC
  Filled 2021-08-26: qty 5

## 2021-08-26 MED ORDER — SODIUM CHLORIDE 0.9 % WEIGHT BASED INFUSION
1.0000 mL/kg/h | INTRAVENOUS | Status: DC
  Administered 2021-08-26: 07:00:00 1 mL/kg/h via INTRAVENOUS

## 2021-08-26 MED ORDER — CHLORHEXIDINE GLUCONATE 0.12 % MT SOLN
15.0000 mL | OROMUCOSAL | Status: AC
  Administered 2021-08-26: 16:00:00 15 mL via OROMUCOSAL

## 2021-08-26 MED ORDER — OXYCODONE HCL 5 MG PO TABS: 5.0000 mg | ORAL_TABLET | ORAL | Status: DC | PRN

## 2021-08-26 MED ORDER — METOCLOPRAMIDE HCL 5 MG/ML IJ SOLN
10.0000 mg | Freq: Four times a day (QID) | INTRAMUSCULAR | Status: AC
  Administered 2021-08-26 – 2021-08-27 (×4): 10 mg via INTRAVENOUS
  Filled 2021-08-26 (×4): qty 2

## 2021-08-26 MED ORDER — BISACODYL 5 MG PO TBEC: 5.0000 mg | DELAYED_RELEASE_TABLET | Freq: Once | ORAL | Status: DC

## 2021-08-26 MED ORDER — ACETAMINOPHEN 160 MG/5ML PO SOLN
1000.0000 mg | Freq: Four times a day (QID) | ORAL | Status: DC
  Administered 2021-08-26: 23:00:00 1000 mg
  Filled 2021-08-26: qty 40.6

## 2021-08-26 MED ORDER — THROMBIN (RECOMBINANT) 20000 UNITS EX SOLR
CUTANEOUS | Status: AC
  Filled 2021-08-26: qty 20000

## 2021-08-26 MED ORDER — NITROGLYCERIN 0.2 MG/ML ON CALL CATH LAB
INTRAVENOUS | Status: DC | PRN
  Administered 2021-08-26: 40 ug via INTRAVENOUS
  Administered 2021-08-26: 80 ug via INTRAVENOUS
  Administered 2021-08-26: 40 ug via INTRAVENOUS

## 2021-08-26 MED ORDER — HEMOSTATIC AGENTS (NO CHARGE) OPTIME
TOPICAL | Status: DC | PRN
  Administered 2021-08-26 (×2): 1 via TOPICAL

## 2021-08-26 MED ORDER — ACETAMINOPHEN 500 MG PO TABS
1000.0000 mg | ORAL_TABLET | Freq: Four times a day (QID) | ORAL | Status: DC
  Administered 2021-08-27 – 2021-08-30 (×12): 1000 mg via ORAL
  Filled 2021-08-26 (×12): qty 2

## 2021-08-26 MED ORDER — METOPROLOL TARTRATE 25 MG/10 ML ORAL SUSPENSION: 12.5000 mg | Freq: Two times a day (BID) | ORAL | Status: DC

## 2021-08-26 MED ORDER — POTASSIUM CHLORIDE 10 MEQ/50ML IV SOLN
10.0000 meq | INTRAVENOUS | Status: AC
  Administered 2021-08-26 (×3): 10 meq via INTRAVENOUS

## 2021-08-26 MED ORDER — HEPARIN (PORCINE) IN NACL 2000-0.9 UNIT/L-% IV SOLN
INTRAVENOUS | Status: AC
  Filled 2021-08-26: qty 1000

## 2021-08-26 MED ORDER — NITROGLYCERIN IN D5W 200-5 MCG/ML-% IV SOLN: 0.0000 ug/min | INTRAVENOUS | Status: DC

## 2021-08-26 MED ORDER — HEPARIN SODIUM (PORCINE) 1000 UNIT/ML IJ SOLN
INTRAMUSCULAR | Status: DC | PRN
  Administered 2021-08-26: 25000 [IU] via INTRAVENOUS

## 2021-08-26 MED ORDER — ASPIRIN EC 325 MG PO TBEC
325.0000 mg | DELAYED_RELEASE_TABLET | Freq: Every day | ORAL | Status: DC
  Administered 2021-08-27: 325 mg via ORAL
  Filled 2021-08-26: qty 1

## 2021-08-26 MED ORDER — SODIUM CHLORIDE 0.45 % IV SOLN: INTRAVENOUS | Status: DC | PRN

## 2021-08-26 MED ORDER — LIDOCAINE HCL (PF) 1 % IJ SOLN
INTRAMUSCULAR | Status: AC
  Filled 2021-08-26: qty 30

## 2021-08-26 MED ORDER — FENTANYL CITRATE (PF) 100 MCG/2ML IJ SOLN
INTRAMUSCULAR | Status: DC | PRN
  Administered 2021-08-26 (×2): 25 ug via INTRAVENOUS

## 2021-08-26 MED ORDER — PROPOFOL 10 MG/ML IV BOLUS
INTRAVENOUS | Status: DC | PRN
  Administered 2021-08-26: 20 mg via INTRAVENOUS

## 2021-08-26 MED ORDER — THROMBIN 20000 UNITS EX SOLR
OROMUCOSAL | Status: DC | PRN
  Administered 2021-08-26 (×3): 4 mL via TOPICAL

## 2021-08-26 MED ORDER — HYDRALAZINE HCL 20 MG/ML IJ SOLN: 10.0000 mg | INTRAMUSCULAR | Status: AC | PRN

## 2021-08-26 MED ORDER — DEXMEDETOMIDINE HCL IN NACL 400 MCG/100ML IV SOLN
0.1000 ug/kg/h | INTRAVENOUS | Status: AC
  Administered 2021-08-26: 12:00:00 .7 ug/kg/h via INTRAVENOUS
  Filled 2021-08-26: qty 100

## 2021-08-26 MED ORDER — ROCURONIUM BROMIDE 10 MG/ML (PF) SYRINGE
PREFILLED_SYRINGE | INTRAVENOUS | Status: AC
  Filled 2021-08-26: qty 30

## 2021-08-26 MED ORDER — TRANEXAMIC ACID (OHS) BOLUS VIA INFUSION
15.0000 mg/kg | INTRAVENOUS | Status: AC
  Administered 2021-08-26: 12:00:00 1248 mg via INTRAVENOUS
  Filled 2021-08-26: qty 1248

## 2021-08-26 MED ORDER — ONDANSETRON HCL 4 MG/2ML IJ SOLN
4.0000 mg | Freq: Four times a day (QID) | INTRAMUSCULAR | Status: DC | PRN
  Administered 2021-08-27: 4 mg via INTRAVENOUS
  Filled 2021-08-26: qty 2

## 2021-08-26 SURGICAL SUPPLY — 113 items
BAG DECANTER FOR FLEXI CONT (MISCELLANEOUS) ×5 IMPLANT
BLADE CLIPPER SURG (BLADE) ×3 IMPLANT
BLADE STERNUM SYSTEM 6 (BLADE) ×5 IMPLANT
BLADE SURG 11 STRL SS (BLADE) ×2 IMPLANT
BNDG ELASTIC 4X5.8 VLCR STR LF (GAUZE/BANDAGES/DRESSINGS) ×5 IMPLANT
BNDG ELASTIC 6X5.8 VLCR STR LF (GAUZE/BANDAGES/DRESSINGS) ×5 IMPLANT
BNDG GAUZE ELAST 4 BULKY (GAUZE/BANDAGES/DRESSINGS) ×5 IMPLANT
CANISTER SUCT 3000ML PPV (MISCELLANEOUS) ×5 IMPLANT
CATH ROBINSON RED A/P 18FR (CATHETERS) ×10 IMPLANT
CATH THORACIC 28FR (CATHETERS) ×5 IMPLANT
CATH THORACIC 36FR (CATHETERS) ×5 IMPLANT
CATH THORACIC 36FR RT ANG (CATHETERS) ×5 IMPLANT
CLIP TI WIDE RED SMALL 24 (CLIP) ×2 IMPLANT
CLIP VESOCCLUDE MED 24/CT (CLIP) IMPLANT
CLIP VESOCCLUDE SM WIDE 24/CT (CLIP) IMPLANT
CONTAINER PROTECT SURGISLUSH (MISCELLANEOUS) ×10 IMPLANT
DERMABOND ADVANCED (GAUZE/BANDAGES/DRESSINGS) ×2
DERMABOND ADVANCED .7 DNX12 (GAUZE/BANDAGES/DRESSINGS) IMPLANT
DRAIN CHANNEL 10M FLAT 3/4 FLT (DRAIN) ×2 IMPLANT
DRAPE CARDIOVASCULAR INCISE (DRAPES) ×5
DRAPE SRG 135X102X78XABS (DRAPES) ×3 IMPLANT
DRAPE WARM FLUID 44X44 (DRAPES) ×5 IMPLANT
DRSG COVADERM 4X14 (GAUZE/BANDAGES/DRESSINGS) ×5 IMPLANT
ELECT CAUTERY BLADE 6.4 (BLADE) ×5 IMPLANT
ELECT REM PT RETURN 9FT ADLT (ELECTROSURGICAL) ×10
ELECTRODE REM PT RTRN 9FT ADLT (ELECTROSURGICAL) ×6 IMPLANT
EVACUATOR SILICONE 100CC (DRAIN) ×2 IMPLANT
FELT TEFLON 1X6 (MISCELLANEOUS) ×8 IMPLANT
GAUZE 4X4 16PLY ~~LOC~~+RFID DBL (SPONGE) ×2 IMPLANT
GAUZE SPONGE 4X4 12PLY STRL (GAUZE/BANDAGES/DRESSINGS) ×10 IMPLANT
GAUZE SPONGE 4X4 12PLY STRL LF (GAUZE/BANDAGES/DRESSINGS) ×4 IMPLANT
GLOVE SURG ENC MOIS LTX SZ6 (GLOVE) IMPLANT
GLOVE SURG ENC MOIS LTX SZ6.5 (GLOVE) IMPLANT
GLOVE SURG ENC MOIS LTX SZ7 (GLOVE) IMPLANT
GLOVE SURG ENC MOIS LTX SZ7.5 (GLOVE) IMPLANT
GLOVE SURG GAMMEX LF SZ7 (GLOVE) ×6 IMPLANT
GLOVE SURG MICRO LTX SZ6 (GLOVE) ×6 IMPLANT
GLOVE SURG MICRO LTX SZ6.5 (GLOVE) ×6 IMPLANT
GLOVE SURG MICRO LTX SZ7 (GLOVE) ×10 IMPLANT
GLOVE SURG ORTHO LTX SZ7.5 (GLOVE) IMPLANT
GLOVE SURG UNDER POLY LF SZ6 (GLOVE) IMPLANT
GLOVE SURG UNDER POLY LF SZ6.5 (GLOVE) ×4 IMPLANT
GLOVE SURG UNDER POLY LF SZ7 (GLOVE) IMPLANT
GOWN STRL REUS W/ TWL LRG LVL3 (GOWN DISPOSABLE) ×12 IMPLANT
GOWN STRL REUS W/ TWL XL LVL3 (GOWN DISPOSABLE) ×3 IMPLANT
GOWN STRL REUS W/TWL LRG LVL3 (GOWN DISPOSABLE) ×30
GOWN STRL REUS W/TWL XL LVL3 (GOWN DISPOSABLE) ×5
HEMOSTAT POWDER SURGIFOAM 1G (HEMOSTASIS) ×15 IMPLANT
HEMOSTAT SURGICEL 2X14 (HEMOSTASIS) ×5 IMPLANT
INSERT FOGARTY 61MM (MISCELLANEOUS) IMPLANT
INSERT FOGARTY XLG (MISCELLANEOUS) IMPLANT
KIT BASIN OR (CUSTOM PROCEDURE TRAY) ×5 IMPLANT
KIT CATH CPB BARTLE (MISCELLANEOUS) ×5 IMPLANT
KIT SUCTION CATH 14FR (SUCTIONS) ×5 IMPLANT
KIT TURNOVER KIT B (KITS) ×5 IMPLANT
KIT VASOVIEW HEMOPRO 2 VH 4000 (KITS) ×5 IMPLANT
NS IRRIG 1000ML POUR BTL (IV SOLUTION) ×25 IMPLANT
PACK E OPEN HEART (SUTURE) ×5 IMPLANT
PACK OPEN HEART (CUSTOM PROCEDURE TRAY) ×5 IMPLANT
PAD ARMBOARD 7.5X6 YLW CONV (MISCELLANEOUS) ×10 IMPLANT
PAD ELECT DEFIB RADIOL ZOLL (MISCELLANEOUS) ×5 IMPLANT
PENCIL BUTTON HOLSTER BLD 10FT (ELECTRODE) ×5 IMPLANT
POSITIONER HEAD DONUT 9IN (MISCELLANEOUS) ×5 IMPLANT
PUNCH AORTIC ROTATE 4.0MM (MISCELLANEOUS) IMPLANT
PUNCH AORTIC ROTATE 4.5MM 8IN (MISCELLANEOUS) ×5 IMPLANT
PUNCH AORTIC ROTATE 5MM 8IN (MISCELLANEOUS) IMPLANT
SET MPS 3-ND DEL (MISCELLANEOUS) ×2 IMPLANT
SOL ANTI FOG 6CC (MISCELLANEOUS) IMPLANT
SOLUTION ANTI FOG 6CC (MISCELLANEOUS) ×2
SPONGE INTESTINAL PEANUT (DISPOSABLE) IMPLANT
SPONGE T-LAP 18X18 ~~LOC~~+RFID (SPONGE) ×10 IMPLANT
SPONGE T-LAP 4X18 ~~LOC~~+RFID (SPONGE) ×7 IMPLANT
SUPPORT HEART JANKE-BARRON (MISCELLANEOUS) ×5 IMPLANT
SUT BONE WAX W31G (SUTURE) ×5 IMPLANT
SUT MNCRL AB 4-0 PS2 18 (SUTURE) ×2 IMPLANT
SUT PROLENE 3 0 SH DA (SUTURE) IMPLANT
SUT PROLENE 3 0 SH1 36 (SUTURE) ×5 IMPLANT
SUT PROLENE 4 0 RB 1 (SUTURE)
SUT PROLENE 4 0 SH DA (SUTURE) IMPLANT
SUT PROLENE 4-0 RB1 .5 CRCL 36 (SUTURE) IMPLANT
SUT PROLENE 5 0 C 1 36 (SUTURE) IMPLANT
SUT PROLENE 6 0 C 1 30 (SUTURE) IMPLANT
SUT PROLENE 7 0 BV 1 (SUTURE) IMPLANT
SUT PROLENE 7 0 BV1 MDA (SUTURE) ×5 IMPLANT
SUT PROLENE 8 0 BV175 6 (SUTURE) IMPLANT
SUT SILK  1 MH (SUTURE)
SUT SILK 1 MH (SUTURE) IMPLANT
SUT SILK 2 0 SH (SUTURE) IMPLANT
SUT SILK 2 0 SH CR/8 (SUTURE) ×2 IMPLANT
SUT STEEL STERNAL CCS#1 18IN (SUTURE) ×4 IMPLANT
SUT STEEL SZ 6 DBL 3X14 BALL (SUTURE) IMPLANT
SUT VIC AB 1 CTX 36 (SUTURE) ×20
SUT VIC AB 1 CTX36XBRD ANBCTR (SUTURE) ×6 IMPLANT
SUT VIC AB 2-0 CT1 27 (SUTURE) ×5
SUT VIC AB 2-0 CT1 TAPERPNT 27 (SUTURE) IMPLANT
SUT VIC AB 2-0 CTX 27 (SUTURE) IMPLANT
SUT VIC AB 3-0 SH 27 (SUTURE)
SUT VIC AB 3-0 SH 27X BRD (SUTURE) IMPLANT
SUT VIC AB 3-0 X1 27 (SUTURE) IMPLANT
SUT VICRYL 4-0 PS2 18IN ABS (SUTURE) IMPLANT
SYSTEM SAHARA CHEST DRAIN ATS (WOUND CARE) ×5 IMPLANT
TAPE CLOTH SOFT 2X10 (GAUZE/BANDAGES/DRESSINGS) ×2 IMPLANT
TAPE CLOTH SURG 4X10 WHT LF (GAUZE/BANDAGES/DRESSINGS) ×2 IMPLANT
TAPE PAPER 2X10 WHT MICROPORE (GAUZE/BANDAGES/DRESSINGS) ×2 IMPLANT
TOWEL GREEN STERILE (TOWEL DISPOSABLE) ×5 IMPLANT
TOWEL GREEN STERILE FF (TOWEL DISPOSABLE) ×5 IMPLANT
TRAY FOLEY SLVR 16FR TEMP STAT (SET/KITS/TRAYS/PACK) ×5 IMPLANT
TUBE CONNECTING 12'X1/4 (SUCTIONS) ×1
TUBE CONNECTING 12X1/4 (SUCTIONS) ×1 IMPLANT
TUBING LAP HI FLOW INSUFFLATIO (TUBING) ×5 IMPLANT
UNDERPAD 30X36 HEAVY ABSORB (UNDERPADS AND DIAPERS) ×5 IMPLANT
WATER STERILE IRR 1000ML POUR (IV SOLUTION) ×10 IMPLANT
YANKAUER SUCT BULB TIP NO VENT (SUCTIONS) ×2 IMPLANT

## 2021-08-26 SURGICAL SUPPLY — 12 items
CATH INFINITI JR4 5F (CATHETERS) ×2 IMPLANT
CATH OPTITORQUE TIG 4.0 5F (CATHETERS) ×2 IMPLANT
DEVICE RAD COMP TR BAND LRG (VASCULAR PRODUCTS) ×2 IMPLANT
GLIDESHEATH SLEND SS 6F .021 (SHEATH) ×2 IMPLANT
GUIDEWIRE INQWIRE 1.5J.035X260 (WIRE) IMPLANT
INQWIRE 1.5J .035X260CM (WIRE) ×3
KIT HEART LEFT (KITS) ×3 IMPLANT
PACK CARDIAC CATHETERIZATION (CUSTOM PROCEDURE TRAY) ×3 IMPLANT
SHEATH PROBE COVER 6X72 (BAG) ×2 IMPLANT
SYR MEDRAD MARK 7 150ML (SYRINGE) ×3 IMPLANT
TRANSDUCER W/STOPCOCK (MISCELLANEOUS) ×3 IMPLANT
TUBING CIL FLEX 10 FLL-RA (TUBING) ×3 IMPLANT

## 2021-08-26 NOTE — OR Nursing (Signed)
1st call to SICU charge [id number].

## 2021-08-26 NOTE — Progress Notes (Signed)
°  Echocardiogram Echocardiogram Transesophageal has been performed.  Leta Jungling M 08/26/2021, 11:51 AM

## 2021-08-26 NOTE — Progress Notes (Signed)
Pt failed wean attempt. Low RR during CPAP/PS trial, ABG shows resp acidosis. Flipped back to full support by RT at 2025.   ABG    Component Value Date/Time   PHART 7.234 (L) 08/26/2021 2023   PCO2ART 57.9 (H) 08/26/2021 2023   PO2ART 80 (L) 08/26/2021 2023   HCO3 24.4 08/26/2021 2023   TCO2 26 08/26/2021 2023   ACIDBASEDEF 4.0 (H) 08/26/2021 2023   O2SAT 92.0 08/26/2021 2023

## 2021-08-26 NOTE — Anesthesia Procedure Notes (Signed)
Central Venous Catheter Insertion Performed by: Annye Asa, MD, anesthesiologist Start/End12/14/2022 11:52 AM, 08/26/2021 12:06 AM Patient location: OR. Preanesthetic checklist: patient identified, IV checked, risks and benefits discussed, surgical consent, monitors and equipment checked, pre-op evaluation, timeout performed and anesthesia consent Position: supine Lidocaine 1% used for infiltration and patient sedated Hand hygiene performed , maximum sterile barriers used  and Seldinger technique used Catheter size: 8.5 Fr PA cath was placed.Sheath introducer Swan type:thermodilution Procedure performed using ultrasound guided technique. Ultrasound Notes:anatomy identified, needle tip was noted to be adjacent to the nerve/plexus identified, no ultrasound evidence of intravascular and/or intraneural injection and image(s) printed for medical record Attempts: 1 Following insertion, line sutured and dressing applied. Post procedure assessment: blood return through all ports, free fluid flow and no air  Patient tolerated the procedure well with no immediate complications. Additional procedure comments: PA catheter:  Routine monitors. Timeout, sterile prep, drape, FBP R neck.  Supine position.  1% [place]ido local, finder and trocar RIJ 1st pass with US guidance.  Cordis placed over J wire. PA catheter in easily.  Sterile dressing applied.  Patient tolerated well, VSS.  Jenita Seashore, MD .

## 2021-08-26 NOTE — Progress Notes (Signed)
ANTICOAGULATION CONSULT NOTE - Follow Up Consult  Pharmacy Consult for IV Heparin Indication: atrial fibrillation  Allergies  Allergen Reactions   Propoxyphene Rash and Other (See Comments)    Darvocet     Patient Measurements: Height: 5\' 5"  (165.1 cm) IBW/kg (Calculated) : 57 Heparin Dosing Weight: 74.8 kg  Vital Signs: BP: 175/86 (12/14 1015) Pulse Rate: 63 (12/14 1015)  Labs: Recent Labs    08/25/21 0012 08/25/21 0012 08/25/21 1153 08/25/21 1426 08/25/21 1644 08/25/21 2110 08/26/21 0345 08/26/21 1024 08/26/21 1030 08/26/21 1135 08/26/21 1243 08/26/21 1310 08/26/21 1312 08/26/21 1335 08/26/21 1359  HGB 12.7  --  14.0  --   --   --  13.0  --    < > 12.2 12.6   < > 9.9* 9.1*   8.8*   9.2* 9.2*  HCT 39.5  --  44.8  --   --   --  40.6  --    < > 36.0 37.0   < > 29.0* 27.0*   26.0*   27.0* 27.0*  PLT 267  --  239  --   --   --  209  --   --   --   --   --   --  167  --   APTT  --   --   --   --  30  --  70*  --   --   --   --   --   --   --   --   LABPROT  --   --   --   --  15.0  --   --   --   --   --   --   --   --   --   --   INR  --   --   --   --  1.2  --   --   --   --   --   --   --   --   --   --   HEPARINUNFRC  --   --   --   --  >1.10*  --  0.89*  --   --   --   --   --   --   --   --   CREATININE  --    < > 0.86  --   --   --  0.74  --   --  0.60 0.60  --   --  0.50  --   CKTOTAL 30*  --   --   --   --   --   --   --   --   --   --   --   --   --   --   TROPONINIHS  --    < > 11 12  --  11  --  8  --   --   --   --   --   --   --    < > = values in this interval not displayed.     Estimated Creatinine Clearance: 65.7 mL/min (by C-G formula based on SCr of 0.5 mg/dL).   Medical History: Past Medical History:  Diagnosis Date   Atrial fibrillation (HCC)    AV block, 2nd degree 08/2016   Constipation    Coronary artery disease involving native coronary artery of native heart with angina pectoris (HCC) 2008   - DES PCI mLAD   Cystocele  with prolapse     Cystocele with rectocele    Diabetes mellitus without complication (HCC)    type 2   GERD (gastroesophageal reflux disease)    Headache    sinus migraine occ   High cholesterol    Hypertension    Presence of drug coated stent in anterior descending branch of left coronary artery 2008   Taxus DES 2.75 x 16 mm (post-dilated to 3.0 mm) - calcified mid LAD segment.   Presence of permanent cardiac pacemaker 08/17/2016   [employer]   Wears glasses     Assessment: 75 yr old woman with hx of CAD (PCI 2008), HTN, HLD, DM, Afib, advanced heart block w/PPM presented with CP. Pharmacy was consulted to dose IV  heparin for atrial fibrillation. Pt was on Xarelto PTA (last dose on 08/24/21 at 2200). Given recent Xarelto exposure, will monitor anticoagulation using aPTT until aPTT and heparin levels correlate.  Pt is S/P cardiac cath today, which showed severe L main CAD; CVTS consulted, pt for emergent CABG this AM after cath (pt in OR now). Pharmacy is consulted to resume IV heparin 6 hrs after sheath removal. As pt is currently in OR, will F/U after OR re: anticoagulation plans.  Pt had therapeutic aPTT (70 sec) at 0345 AM today pre cath on heparin infusion at 1200 units/hr; heparin level at that time was 0.89 units/ml, indicating that Xarelto is still influencing heparin levels. H/H 9.2/27.0, plt 167.   Goal of Therapy:  Heparin level 0.3-0.7 units/ml aPTT 66-102 seconds Monitor platelets by anticoagulation protocol: Yes   Plan:  F/U anticoagulation plan after OR Monitor daily aPTT, heparin level, CBC while on heparin Monitor for bleeding F/U transition back to Xarelto when able  Gillermina Hu, PharmD, BCPS, [facility] Clinical Pharmacist

## 2021-08-26 NOTE — Anesthesia Postprocedure Evaluation (Signed)
Anesthesia Post Note  Patient: [name] [name]  Procedure(s) Performed: CORONARY ARTERY BYPASS GRAFTING (CABG) TIMES 3 USING LEFT INTERNAL MAMMARY ARTERY AND GREATER SAPHENOUS VEIN HARVESTED ENDOSCOPICALLY. LIMA TO LAD, SVG TO OM1, SVG TO OM2 (Chest) TRANSESOPHAGEAL ECHOCARDIOGRAM (TEE) ENDOVEIN HARVEST OF GREATER SAPHENOUS VEIN (Right: Leg Upper) APPLICATION OF CELL SAVER     Patient location during evaluation: SICU Anesthesia Type: General Level of consciousness: sedated and patient remains intubated per anesthesia plan Pain management: pain level controlled Vital Signs Assessment: post-procedure vital signs reviewed and stable Respiratory status: patient remains intubated per anesthesia plan and patient on ventilator - see flowsheet for VS Cardiovascular status: stable (requiring NTG infusion to control BP) Postop Assessment: no apparent nausea or vomiting Anesthetic complications: no Comments: Pt doing very well post op, to begin weaning vent soon.   No notable events documented.  Last Vitals:  Vitals:   08/26/21 1015 08/26/21 1535  BP: (!) 175/86   Pulse: 63   Resp: 17   Temp:    SpO2: 96% 93%    Last Pain:  Vitals:   08/26/21 0955  TempSrc:   PainSc: 4                  Ismaeel Arvelo,E. Lummie Montijo

## 2021-08-26 NOTE — Transfer of Care (Signed)
Immediate Anesthesia Transfer of Care Note  Patient: [name] [name]  Procedure(s) Performed: CORONARY ARTERY BYPASS GRAFTING (CABG) TIMES 3 USING LEFT INTERNAL MAMMARY ARTERY AND GREATER SAPHENOUS VEIN HARVESTED ENDOSCOPICALLY. LIMA TO LAD, SVG TO OM1, SVG TO OM2 (Chest) TRANSESOPHAGEAL ECHOCARDIOGRAM (TEE) ENDOVEIN HARVEST OF GREATER SAPHENOUS VEIN (Right: Leg Upper) APPLICATION OF CELL SAVER  Patient Location: ICU  Anesthesia Type:General  Level of Consciousness: patient cooperative and Patient remains intubated per anesthesia plan  Airway & Oxygen Therapy: Patient remains intubated per anesthesia plan and Patient placed on Ventilator (see vital sign flow sheet for setting)  Post-op Assessment: Report given to RN and Post -op Vital signs reviewed and stable  Post vital signs: Reviewed and stable  Last Vitals:  Vitals Value Taken Time  BP    Temp 35.3 C 08/26/21 1552  Pulse 88 08/26/21 1552  Resp 12 08/26/21 1552  SpO2 94 % 08/26/21 1552  Vitals shown include unvalidated device data.  Last Pain:  Vitals:   08/26/21 0955  TempSrc:   PainSc: 4       Patients Stated Pain Goal: 0 (08/26/21 0955)  Complications: No notable events documented.

## 2021-08-26 NOTE — Progress Notes (Signed)
° °   °  [address]       [place] [address]             [phone]      S/p emergency CABG x 3  Intubated, close to extubation  BP 137/74    Pulse 88    Temp 98.2 F (36.8 C)    Resp (!) 21    Ht 5\' 5"  (1.651 m)    SpO2 92%    BMI 30.52 kg/m  25/14 CI= 2.75  Intake/Output Summary (Last 24 hours) at 08/26/2021 1812 Last data filed at 08/26/2021 1800 Gross per 24 hour  Intake 3819.6 ml  Output 3390 ml  Net 429.6 ml   Hct 32  Doing well early postop  08/28/2021 C. Viviann Spare, MD [facility] [phone]

## 2021-08-26 NOTE — Anesthesia Procedure Notes (Signed)
Procedure Name: Intubation Date/Time: 08/26/2021 11:22 AM Performed by: Rosiland Oz, CRNA Pre-anesthesia Checklist: Patient identified, Emergency Drugs available, Patient being monitored, Suction available and Timeout performed Patient Re-evaluated:Patient Re-evaluated prior to induction Oxygen Delivery Method: Circle system utilized Preoxygenation: Pre-oxygenation with 100% oxygen Induction Type: IV induction Ventilation: Mask ventilation without difficulty Laryngoscope Size: Miller and 3 Grade View: Grade I Tube type: Oral Tube size: 7.5 mm Number of attempts: 1 Airway Equipment and Method: Stylet Placement Confirmation: ETT inserted through vocal cords under direct vision, positive ETCO2 and breath sounds checked- equal and bilateral Secured at: 21 cm Tube secured with: Tape Dental Injury: Teeth and Oropharynx as per pre-operative assessment

## 2021-08-26 NOTE — Progress Notes (Addendum)
RWP started, RT placed pt on 40/4 at 1940.

## 2021-08-26 NOTE — Anesthesia Procedure Notes (Signed)
Arterial Line Insertion Start/End12/14/2022 10:50 AM, 08/26/2021 11:05 AM Performed by: Lynnell Chad, CRNA, CRNA  Preanesthetic checklist: patient identified, IV checked, site marked, risks and benefits discussed, surgical consent, monitors and equipment checked, pre-op evaluation, timeout performed and anesthesia consent Lidocaine 1% used for infiltration and patient sedated Left, radial was placed Catheter size: 20 G Hand hygiene performed , maximum sterile barriers used  and Seldinger technique used  Attempts: 2 Procedure performed without using ultrasound guided technique. Following insertion, dressing applied and Biopatch. Post procedure assessment: normal and unchanged  Patient tolerated the procedure well with no immediate complications.

## 2021-08-26 NOTE — Hospital Course (Addendum)
HPI: This is a 75 year old female with a past medical history of CAD (s/p PCI, stent), hypertension, hyperlipidemia, diabetes mellitus atrial fibrillation, heart block (has PPM) was seen by Francis Dowse in the EP office on 08/25/2021. Patient had complaints of chest discomfort for about the last month. At their recommendation, patient presented to the ED for further evaluation. EKG showed V pacing. Troponins have been negative. Cardiac catheterization was done on 12/14. Results showed mid left main to ostial LAD with a 95% stenosis, 75% side branch stenosis of the ostial Circumflex, LVEF 55-65%, and first marginal with a 90% stenosis. Dr. Laneta Simmers was consulted for consideration of emergent coronary artery bypass grafting surgery. Potential risks, benefits, and complications of the surgery were discussed with the patient and she agreed to proceed with surgery.  Hospital Course: Patient underwent an emergent CABG x 3. She was transferred from the OR to [facility] ICU in stable condition. She was extubated early the morning of post op day one. She was weaned off Nitro drip. Theone Murdoch, a line, leg JP, and chest tubes were removed on post op day one. Patient has a history of a fib and will consider resuming Xarelto at discharge. She was volume overloaded and diuresed accordingly. Foley and sleeve were removed on post op day 2. She was weaned off the Insulin drip. Her pre op HGA1C was 7.1. Will restart Metformin XR closer/at discharge. She was V paced (has PPM) and was restarted on Atenolol.  Her pacing wires were removed without difficulty. She was felt surgically stable for transfer from the ICU to 4E for further convalescence on 12/16. She was requiring 2 liters of oxygen via [place] but was later weaned to room air.  She was hypertensive and restarted on her home Lisinopril.  She has been tolerating a diet and has had a bowel movement. Her wounds are clean, dry, and healing without signs of infection.  She is ambulating without  difficulty.  She is felt medically stable for discharge home today.

## 2021-08-26 NOTE — Anesthesia Preprocedure Evaluation (Addendum)
Anesthesia Evaluation  Patient identified by MRN, date of birth, ID band Patient awake    Reviewed: Allergy & Precautions, NPO status , Patient's Chart, lab work & pertinent test results  History of Anesthesia Complications Negative for: history of anesthetic complications  Airway Mallampati: II  TM Distance: >3 FB Neck ROM: Full    Dental  (+) Dental Advisory Given   Pulmonary shortness of breath,  08/25/2021 SARS coronavirus NEG   breath sounds clear to auscultation       Cardiovascular hypertension, Pt. on medications + angina + CAD and + Cardiac Stents  + dysrhythmias + pacemaker (for 2 degree heart block)  Rhythm:Regular Rate:Normal  Cath: Severe distal LEFT MAIN 95% calcific, eccentric lesion -> excellent distal targets in the LAD and LCx-OM distribution Severe ostial and proximal OM1 90% stenosis (calcified) Mild proximal LAD disease with widely patent mid LAD stent. Normal, dominant RCA with 20% ostial stenosis Normal LVEDP.   Normal EF and wall motion on hand-injection LV gram.    Neuro/Psych    GI/Hepatic Neg liver ROS, GERD  Medicated and Controlled,  Endo/Other  diabetes (glu 144), Oral Hypoglycemic Agents  Renal/GU negative Renal ROS     Musculoskeletal   Abdominal   Peds  Hematology negative hematology ROS (+)   Anesthesia Other Findings   Reproductive/Obstetrics                            Anesthesia Physical Anesthesia Plan  ASA: 4  Anesthesia Plan: General   Post-op Pain Management:    Induction: Intravenous  PONV Risk Score and Plan: 3 and Treatment may vary due to age or medical condition  Airway Management Planned: Oral ETT  Additional Equipment: Arterial line, PA Cath, TEE and Ultrasound Guidance Line Placement  Intra-op Plan:   Post-operative Plan: Post-operative intubation/ventilation  Informed Consent: I have reviewed the patients History and  Physical, chart, labs and discussed the procedure including the risks, benefits and alternatives for the proposed anesthesia with the patient or authorized representative who has indicated his/her understanding and acceptance.     Dental advisory given  Plan Discussed with: CRNA and Surgeon  Anesthesia Plan Comments:        Anesthesia Quick Evaluation

## 2021-08-26 NOTE — H&P (View-Only) (Signed)
Cardiology Progress Note  Patient ID: [name] [name] MRN: [id number] DOB: [birth year] Date of Encounter: 08/26/2021  Primary Cardiologist: Lewayne Bunting, MD  Subjective   Chief Complaint: None.   HPI: No CP this AM. NPO for left heart cath.   ROS:  All other ROS reviewed and negative. Pertinent positives noted in the HPI.     Inpatient Medications  Scheduled Meds:  aspirin EC  81 mg Oral Daily   atenolol  25 mg Oral q morning   cholecalciferol  2,000 Units Oral q morning   insulin aspart  0-15 Units Subcutaneous TID WC   nitroGLYCERIN  0.4 mg Transdermal Daily   pantoprazole  40 mg Oral Daily   rosuvastatin  40 mg Oral QHS   sodium chloride flush  3 mL Intravenous Q12H   Continuous Infusions:  sodium chloride     sodium chloride 1 mL/kg/hr (08/26/21 0721)   heparin 1,200 Units/hr (08/25/21 1616)   PRN Meds: sodium chloride, acetaminophen, nitroGLYCERIN, nitroGLYCERIN, ondansetron (ZOFRAN) IV, sodium chloride flush   Vital Signs   Vitals:   08/26/21 0100 08/26/21 0200 08/26/21 0300 08/26/21 0630  BP: (!) 114/56 111/68 123/72 121/63  Pulse: 60 63 60 60  Resp: 12 20 14 16   Temp:      TempSrc:      SpO2: 97% 94% 92% 96%   No intake or output data in the 24 hours ending 08/26/21 0728 Last 3 Weights 08/25/2021 12/08/2020 12/04/2020  Weight (lbs) 183 lb 6.4 oz 175 lb 3.2 oz 172 lb  Weight (kg) 83.19 kg 79.47 kg 78.019 kg      Telemetry  Overnight telemetry shows SR with V paced rhythm, which I personally reviewed.   ECG  The most recent ECG shows SR with V paced, which I personally reviewed.   Physical Exam   Vitals:   08/26/21 0100 08/26/21 0200 08/26/21 0300 08/26/21 0630  BP: (!) 114/56 111/68 123/72 121/63  Pulse: 60 63 60 60  Resp: 12 20 14 16   Temp:      TempSrc:      SpO2: 97% 94% 92% 96%   No intake or output data in the 24 hours ending 08/26/21 0728  Last 3 Weights 08/25/2021 12/08/2020 12/04/2020  Weight (lbs) 183 lb 6.4 oz 175 lb 3.2 oz 172 lb   Weight (kg) 83.19 kg 79.47 kg 78.019 kg    There is no height or weight on file to calculate BMI.   General: Well nourished, well developed, in no acute distress Head: Atraumatic, normal size  Eyes: PEERLA, EOMI  Neck: Supple, no JVD Endocrine: No thryomegaly Cardiac: Normal S1, S2; RRR; no murmurs, rubs, or gallops Lungs: Clear to auscultation bilaterally, no wheezing, rhonchi or rales  Abd: Soft, nontender, no hepatomegaly  Ext: No edema, pulses 2+ Musculoskeletal: No deformities, BUE and BLE strength normal and equal Skin: Warm and dry, no rashes   Neuro: Alert and oriented to person, place, time, and situation, CNII-XII grossly intact, no focal deficits  Psych: Normal mood and affect   Labs  High Sensitivity Troponin:   Recent Labs  Lab 08/25/21 1153 08/25/21 1426 08/25/21 2110  TROPONINIHS 11 12 11      Cardiac EnzymesNo results for input(s): TROPONINI in the last 168 hours. No results for input(s): TROPIPOC in the last 168 hours.  Chemistry Recent Labs  Lab 08/25/21 1153 08/26/21 0345  NA 141 135  K 3.7 3.5  CL 105 101  CO2 28 24  GLUCOSE 116* 133*  BUN  16 17  CREATININE 0.86 0.74  CALCIUM 9.2 8.9  GFRNONAA >60 >60  ANIONGAP 8 10    Hematology Recent Labs  Lab 08/25/21 0012 08/25/21 1153 08/26/21 0345  WBC 9.0 7.1 7.1  RBC 4.00 5.31* 4.84  HGB 12.7 14.0 13.0  HCT 39.5 44.8 40.6  MCV 98.8 84.4 83.9  MCH 31.8 26.4 26.9  MCHC 32.2 31.3 32.0  RDW 14.5 13.7 13.9  PLT 267 239 209   BNP Recent Labs  Lab 08/25/21 1153  BNP 328.3*    DDimer No results for input(s): DDIMER in the last 168 hours.   Radiology  DG Chest 2 View  Result Date: 08/25/2021 CLINICAL DATA:  Shortness of breath, LEFT side chest pain, symptoms for 1 month, getting worse EXAM: CHEST - 2 VIEW COMPARISON:  08/18/2016 FINDINGS: LEFT subclavian sequential transvenous pacemaker leads project at RIGHT atrium and RIGHT ventricle. Normal heart size, mediastinal contours, and pulmonary  vascularity. Mild chronic bronchitic changes with minimal chronic bibasilar atelectasis. No acute infiltrate, pleural effusion or pneumothorax. Bones demineralized. IMPRESSION: Mild chronic bronchitic changes and minimal bibasilar atelectasis. No acute abnormalities. Electronically Signed   By: Lavonia Dana M.D.   On: 08/25/2021 12:29   CUP PACEART INCLINIC DEVICE CHECK  Result Date: 08/25/2021 Pacemaker check in clinic. Normal device function. Thresholds, sensing, impedances consistent with previous measurements. Device programmed to maximize longevity. No mode switch or high ventricular rates noted. Device programmed at appropriate safety margins. Histogram distribution appropriate for patient activity level. Device programmed to optimize intrinsic conduction. Estimated longevity _9 years__. Patient enrolled in remote follow-up. Patient education completed. She has no R waves today at 40, but only VP 68%   Cardiac Studies  Echo pending   Patient Profile  [name] [name] is a 75 y.o. female with Cad s/p pci, HTN, DM, Afib, CHB with ppm admitted 08/25/2021 with unstable angina.   Assessment & Plan   #Unstable Angina #CAD s/p PCI -Admitted with 6 to 8 weeks of progressively worsening chest tightness with exertion.  Radiates into the arm.  Having symptoms at rest as well.  Symptoms concerning for unstable angina per team yesterday. -Prior history of CAD status post PCI. -Troponins are negative.  EKG is V paced. -No further chest pain. -Continue aspirin and heparin drip.  N.p.o. for left heart cath today.  Risk and benefits explained.  She is willing to proceed.  See consent statement below. -Continue beta-blocker.  She is on high intensity statin. -LDL 68.  A1c 7.1.  TSH 1.57. -Last dose of Xarelto was Monday night.  Held last night.  Okay for cath today. -echo pending   Shared Decision Making/Informed Consent The risks [stroke (1 in 1000), death (1 in 1000), kidney failure [usually  temporary] (1 in 500), bleeding (1 in 200), allergic reaction [possibly serious] (1 in 200)], benefits (diagnostic support and management of coronary artery disease) and alternatives of a cardiac catheterization were discussed in detail with [name] [name] and she is willing to proceed.  #DM -Hold home metformin.  A1c 7.1. -Sliding scale insulin.  #HLD -Home statin.  #CHB s/p ppm -No issues with pacemaker.  #pAF -Hold Xarelto for heart cath.  Restart after for interventional cardiology. -Complete heart block V paced.  FEN -pre cath IVF -diet: NPO -code: full -dvt ppx: heparin drip  For questions or updates, please contact [place] Please consult [email or web address] for contact info under   Time Spent with Patient: I have spent a total of 35 minutes with patient reviewing  hospital notes, telemetry, EKGs, labs and examining the patient as well as establishing an assessment and plan that was discussed with the patient.  > 50% of time was spent in direct patient care.    Signed, Addison Naegeli. Audie Box, MD, [place]  08/26/2021 7:28 AM

## 2021-08-26 NOTE — ED Notes (Signed)
Pt resting in stretcher. Cardiology rounded on pt, stated pt's cath is scheduled for approx 0830. Pt verbalizes understanding of plan of care and remains NPO. Pt has call light in reach, denies further needs, denies pain. No distress noted.

## 2021-08-26 NOTE — Discharge Summary (Signed)
hysician Discharge Summary       [address]       [place] [address]             [phone]    Patient ID: [name] [name] MRN: [id number] DOB/AGE: [birth year] 74 y.o.  Admit date: 08/25/2021 Discharge date: 08/30/2021  Admission Diagnoses: Unstable angina ([place]) 2.  Coronary artery disease involving native coronary artery of native heart with angina pectoris [facility])  Discharge Diagnoses:  S/p CABG x 3 3. History of hyperlipidemia 4. History of hypertension 5. History of Paroxysmal atrial fibrillation (HCC) 6. 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   Coronary artery bypass grafting x 3   Left internal mammary artery graft to the LAD SVG to OM1 SVG to OM2   4.   Endoscopic vein harvest from the right leg by Dr. Cyndia Bent on 08/26/2021.  HPI: This is a 75 year old female with a past medical history of CAD (s/p PCI, stent), hypertension, hyperlipidemia, diabetes mellitus atrial fibrillation, heart block (has PPM) was seen by [name] in the EP office on 08/25/2021. Patient had complaints of chest discomfort for about the last month. At their recommendation, patient presented to the ED for further evaluation. EKG showed V pacing. Troponins have been negative. Cardiac catheterization was done on 12/14. Results showed mid left main to ostial LAD with a 95% stenosis, 75% side branch stenosis of the ostial Circumflex, LVEF 55-65%, and first marginal with a 90% stenosis. Dr. Cyndia Bent was consulted for consideration of emergent coronary artery bypass grafting surgery. Potential risks, benefits, and complications of the surgery were discussed with the patient and she agreed to proceed with surgery.  Hospital Course: Patient underwent an emergent CABG x 3. She was transferred from the OR to [facility] ICU in stable condition. She was extubated early the morning of post op day one. She was  weaned off Nitro drip. [name], a line, leg JP, and chest tubes were removed on post op day one. Patient has a history of a fib and will consider resuming Xarelto at discharge. She was volume overloaded and diuresed accordingly. Foley and sleeve were removed on post op day 2. She was weaned off the Insulin drip. Her pre op HGA1C was 7.1. Will restart Metformin XR closer/at discharge. She was V paced (has PPM) and was restarted on Atenolol.  Her pacing wires were removed without difficulty. She was felt surgically stable for transfer from the ICU to 4E for further convalescence on 12/16. She was requiring 2 liters of oxygen via [place] but was later weaned to room air.  She was hypertensive and restarted on her home Lisinopril.  She has been tolerating a diet and has had a bowel movement. Her wounds are clean, dry, and healing without signs of infection.  She is ambulating without difficulty.  She is felt medically stable for discharge home today.  Latest Vital Signs: Blood pressure 127/62, pulse 79, temperature 98.1 F (36.7 C), temperature source Oral, resp. rate 16, height 5\' 5"  (1.651 m), weight 81.6 kg, SpO2 94 %.  Physical Exam:  General appearance: alert, cooperative, and no distress Heart: regular rate and rhythm and paced Lungs: clear to auscultation bilaterally Abdomen: soft, non-tender; bowel sounds normal; no masses,  no organomegaly Extremities: edema none present Wound: clean and dry  Discharge Condition:Stable and discharged to home.  Recent laboratory studies:  Lab Results  Component Value Date   WBC 9.8 08/29/2021   HGB 8.9 (L) 08/29/2021   HCT 28.7 (L) 08/29/2021   MCV 84.7 08/29/2021   PLT 151 08/29/2021   Lab Results  Component Value Date   NA 138 08/29/2021   K 4.0 08/29/2021   CL 105 08/29/2021   CO2 27 08/29/2021   CREATININE 0.98 08/29/2021   GLUCOSE 110 (H) 08/29/2021     Diagnostic Studies:   CARDIAC CATHETERIZATION  Result Date: 08/26/2021   Mid LM to  Ost LAD lesion is 95% stenosed with 75% stenosed side branch in Ost Cx.   1st Mrg lesion is 90% stenosed.   Prox LAD lesion is 30% stenosed. Mid LAD STENT is 5% stenosed.   Ost RCA lesion is 20% stenosed.   The left ventricular systolic function is normal.  The left ventricular ejection fraction is 55-65% by visual estimate.   LV end diastolic pressure is normal.   There is no aortic valve stenosis. SUMMARY Severe distal LEFT MAIN 95% calcific, eccentric lesion -> excellent distal targets in the LAD and LCx-OM distribution Severe ostial and proximal OM1 90% stenosis (calcified) Mild proximal LAD disease with widely patent mid LAD stent. Normal, dominant RCA with 20% ostial stenosis Normal LVEDP.  Normal EF and wall motion on hand-injection LV gram. RECOMMENDATIONS Urgent CVTS consultation placed. With no active ongoing chest pain and hemodynamically stable, no IABP for now. IV NTG with plans to restart IV heparin 6 hours post sheath removal. Has echo ordered, not done Aggressive CV RF modification with GDMT. Bryan Lemma, MD  CUP [facility] DEVICE CHECK  Result Date: 08/25/2021 Pacemaker check in clinic. Normal device function. Thresholds, sensing, impedances consistent with previous measurements. Device programmed to maximize longevity. No mode switch or high ventricular rates noted. Device programmed at appropriate safety margins. Histogram distribution appropriate for patient activity level. Device programmed to optimize intrinsic conduction. Estimated longevity _9 years__. Patient enrolled in remote follow-up. Patient education completed. She has no R waves today at 40, but only VP 68%  ECHO INTRAOPERATIVE TEE  Result Date: 08/26/2021  *INTRAOPERATIVE TRANSESOPHAGEAL REPORT *  Patient Name:   [name] [name] Date of Exam: 08/26/2021 Medical Rec #:  [id number]        Height:       65.0 in Accession #:    [id number]       Weight:       183.4 lb Date of Birth:  [birth year]        BSA:          1.91  m Patient Age:    75 years         BP:           175/86 mmHg Patient Gender: F                HR:           63 bpm. Exam Location:  Anesthesiology Transesophogeal exam was perform intraoperatively during surgical procedure. Patient was closely monitored under general anesthesia during the entirety of examination. Indications:     Coronary Artery Disease Sonographer:     Leta Jungling RDCS Performing Phys: [id number] Alleen Borne Diagnosing Phys: Jairo Ben MD Complications: No known complications during this procedure. POST-OP IMPRESSIONS Limited post-CPB exam: The patient separated easily from CPB. _ Left Ventricle: The left ventricular function is unchanged from pre-bypass images. The LV remains hyperdynamic, with overall EF >70%. There are no regional wall motion abnormalities. _ Right Ventricle:  The right ventricular function appears unchanged from pre-bypass images. _ Aortic Valve: The aortic valve function appears unchanged from pre-bypass images. Trivial, central AI remains. _ Mitral Valve: The mitral valve function appears unchanged from pre-bypass images. _ Tricuspid Valve: The tricuspid valve function appears unchanged from pre-bypass images. PRE-OP FINDINGS  Left Ventricle: The left ventricle has hyperdynamic systolic function, with an ejection fraction of >65%, measured >70%. The cavity size was normal. No evidence of left ventricular regional wall motion abnormalities. There is no left ventricular hypertrophy. Left ventricular diastolic function was not evaluated. Right Ventricle: The right ventricle has normal systolic function. The cavity was normal. There is no increase in right ventricular wall thickness. Right ventricular systolic pressure is normal. Catheter present in the right ventricle. Left Atrium: Left atrial size was normal in size. No left atrial/left atrial appendage thrombus was detected. The left atrial appendage is well visualized and there is no evidence of thrombus present. Left  atrial appendage velocity is normal at greater than 40 cm/s. Right Atrium: Right atrial size was normal in size. Prominent Eustachian valve. Catheter present in the right atrium. Interatrial Septum: No atrial level shunt detected by color flow Doppler. Pericardium: There is no evidence of pericardial effusion. Mitral Valve: The mitral valve is normal in structure. Mitral valve regurgitation is not visualized by color flow Doppler. There is no evidence of mitral valve vegetation. Pulmonary venous flow is normal. There is no evidence of mitral stenosis, with peak gradient 4 mmHg, mean gradient 2 mmHg. Tricuspid Valve: The tricuspid valve was normal in structure. Tricuspid valve regurgitation is mild by color flow Doppler. The jet is directed centrally. No evidence of tricuspid stenosis is present. There is no evidence of tricuspid valve vegetation. Aortic Valve: The aortic valve is tricuspid. Aortic valve regurgitation is mild by color flow Doppler. The jet is centrally-directed. There is no stenosis of the aortic valve, with peak gradient 13 mmHg, mean gradient 6 mmHg. There is no evidence of aortic valve vegetation. Pulmonic Valve: The pulmonic valve was normal in structure, with normal leaflet mobility. No evidence of pulmonic stenosis. Pulmonic valve regurgitation is trivial, around the PA catheter, by color flow Doppler. Aorta: The aortic root, ascending aorta and aortic arch are normal in size and structure. There is evidence of sparse plaque in the descending aorta; Grade I, measuring 1-23mm in size. Pulmonary Artery: Theone Murdoch catheter present on the right. The pulmonary artery is of normal size. Venous: The inferior vena cava is dilated in size with greater than 50% respiratory variability, suggesting right atrial pressure of 8 mmHg. Shunts: There is no evidence of an atrial septal defect, by color Doppler interrogation. +-------------+--------++  AORTIC VALVE             +-------------+--------++  AV Mean  Grad: 6.0 mmHg   +-------------+--------++ +-------------+--------++  MITRAL VALVE             +-------------+--------++  MV Mean grad: 2.0 mmHg   +-------------+--------++  Jairo Ben MD Electronically signed by Jairo Ben MD Signature Date/Time: 08/26/2021/3:53:49 PM    Final     Discharge Instructions     Amb Referral to Cardiac Rehabilitation   Complete by: As directed    Diagnosis: CABG   CABG X ___: 3   After initial evaluation and assessments completed: Virtual Based Care may be provided alone or in conjunction with Phase 2 Cardiac Rehab based on patient barriers.: Yes       Discharge Medications: Allergies as of 08/30/2021  Reactions   Propoxyphene Rash, Other (See Comments)   Darvocet        Medication List     STOP taking these medications    Advil 200 MG tablet Generic drug: ibuprofen   ibuprofen 600 MG tablet Commonly known as: ADVIL       TAKE these medications    acetaminophen 500 MG tablet Commonly known as: TYLENOL Take 2 tablets (1,000 mg total) by mouth every 6 (six) hours as needed. Fever, pain What changed:  when to take this reasons to take this additional instructions   aspirin 81 MG EC tablet Take 1 tablet (81 mg total) by mouth daily.   atenolol 50 MG tablet Commonly known as: TENORMIN Take 25 mg by mouth every morning.   docusate sodium 100 MG capsule Commonly known as: COLACE Take 200 mg by mouth daily.   ezetimibe 10 MG tablet Commonly known as: ZETIA Take 1 tablet (10 mg total) by mouth daily.   lisinopril-hydrochlorothiazide 10-12.5 MG tablet Commonly known as: ZESTORETIC Take 1 tablet by mouth every morning.   metFORMIN 500 MG 24 hr tablet Commonly known as: GLUCOPHAGE-XR Take 500 mg by mouth in the morning and at bedtime.   nitroGLYCERIN 0.4 MG SL tablet Commonly known as: NITROSTAT Place 0.4 mg as needed under the tongue for chest pain.   omeprazole 20 MG capsule Commonly known as:  PRILOSEC Take 20 mg by mouth daily before breakfast.   rosuvastatin 40 MG tablet Commonly known as: CRESTOR Take 40 mg by mouth at bedtime.   traMADol 50 MG tablet Commonly known as: ULTRAM Take 1 tablet (50 mg total) by mouth every 4 (four) hours as needed for moderate pain.   Vitamin D3 25 MCG (1000 UT) Caps Take 1,000 Units by mouth every morning.   Xarelto 20 MG Tabs tablet Generic drug: rivaroxaban TAKE ONE TABLET (20MG  DOSE) BY MOUTH DAILY What changed: See the new instructions.               Durable Medical Equipment  (From admission, onward)           Start     Ordered   08/29/21 1333  For home use only DME Walker rolling  Once       Question Answer Comment  Walker: With 5 Inch Wheels   Patient needs a walker to treat with the following condition Weakness      08/29/21 1334           The patient has been discharged on:   1.Beta Blocker:  Yes [  x ]                              No   [   ]                              If No, reason:  2.Ace Inhibitor/ARB: Yes [ X  ]                                     No  [    ]                                     If No,  reason:  3.Statin:   Yes [  x ]                  No  [   ]                  If No, reason:  4.Ecasa:  Yes  [  x ]                  No   [   ]                  If No, reason:  Patient had ACS upon admission:NO  Plavix/P2Y12 inhibitor: Yes [   ]                                      No  [ x  ]   Follow Up Appointments:  Follow-up Information     O'Neal, Cassie Freer, MD. Go on 09/21/2021.   Specialties: Cardiology, Internal Medicine, Radiology Why: Appointment time is at 9:30 am Contact information: [address] [place] [address] [phone]         [facility] [place]. Go on 09/08/2021.   Specialty: Cardiothoracic Surgery Why: Appointment is with nurse only for chest tube suture removal. Appointment time is at 10:00 am Contact  information: [place], [place] [phone]        [facility] [place]. Go on 09/30/2021.   Specialty: Cardiothoracic Surgery Why: PA/LAT CXR to be taken (at [place] which is in the same building as Dr. Vivi Martens office) on 01/18 at 12:30 pm;Appointment time is at 1:00 pm Contact information: [address] [place], [place] [phone]                Signed: Ellamae Sia 08/30/2021, 8:02 AM

## 2021-08-26 NOTE — Progress Notes (Signed)
Weaning initiated. Patient is tolerating it well. Acceptable minute ventilation noted at this time

## 2021-08-26 NOTE — ED Notes (Signed)
Patient is resting comfortably. Up to BR. Pillow given

## 2021-08-26 NOTE — Progress Notes (Signed)
Pt transferred to OR 17 for CABG today, pt's friends x2 allowed in holding area before transfer, Dr. Garen Grams to have call pt's DIL, April pre-surgery, IV NS and NTG gtt continue infusing from cath lab, safety maintained, monitor with pads remains in place

## 2021-08-26 NOTE — Progress Notes (Signed)
Cardiology Progress Note  Patient ID: [name] [name] MRN: [id number] DOB: [birth year] Date of Encounter: 08/26/2021  Primary Cardiologist: Lewayne Bunting, MD  Subjective   Chief Complaint: None.   HPI: No CP this AM. NPO for left heart cath.   ROS:  All other ROS reviewed and negative. Pertinent positives noted in the HPI.     Inpatient Medications  Scheduled Meds:  aspirin EC  81 mg Oral Daily   atenolol  25 mg Oral q morning   cholecalciferol  2,000 Units Oral q morning   insulin aspart  0-15 Units Subcutaneous TID WC   nitroGLYCERIN  0.4 mg Transdermal Daily   pantoprazole  40 mg Oral Daily   rosuvastatin  40 mg Oral QHS   sodium chloride flush  3 mL Intravenous Q12H   Continuous Infusions:  sodium chloride     sodium chloride 1 mL/kg/hr (08/26/21 0721)   heparin 1,200 Units/hr (08/25/21 1616)   PRN Meds: sodium chloride, acetaminophen, nitroGLYCERIN, nitroGLYCERIN, ondansetron (ZOFRAN) IV, sodium chloride flush   Vital Signs   Vitals:   08/26/21 0100 08/26/21 0200 08/26/21 0300 08/26/21 0630  BP: (!) 114/56 111/68 123/72 121/63  Pulse: 60 63 60 60  Resp: 12 20 14 16   Temp:      TempSrc:      SpO2: 97% 94% 92% 96%   No intake or output data in the 24 hours ending 08/26/21 0728 Last 3 Weights 08/25/2021 12/08/2020 12/04/2020  Weight (lbs) 183 lb 6.4 oz 175 lb 3.2 oz 172 lb  Weight (kg) 83.19 kg 79.47 kg 78.019 kg      Telemetry  Overnight telemetry shows SR with V paced rhythm, which I personally reviewed.   ECG  The most recent ECG shows SR with V paced, which I personally reviewed.   Physical Exam   Vitals:   08/26/21 0100 08/26/21 0200 08/26/21 0300 08/26/21 0630  BP: (!) 114/56 111/68 123/72 121/63  Pulse: 60 63 60 60  Resp: 12 20 14 16   Temp:      TempSrc:      SpO2: 97% 94% 92% 96%   No intake or output data in the 24 hours ending 08/26/21 0728  Last 3 Weights 08/25/2021 12/08/2020 12/04/2020  Weight (lbs) 183 lb 6.4 oz 175 lb 3.2 oz 172 lb   Weight (kg) 83.19 kg 79.47 kg 78.019 kg    There is no height or weight on file to calculate BMI.   General: Well nourished, well developed, in no acute distress Head: Atraumatic, normal size  Eyes: PEERLA, EOMI  Neck: Supple, no JVD Endocrine: No thryomegaly Cardiac: Normal S1, S2; RRR; no murmurs, rubs, or gallops Lungs: Clear to auscultation bilaterally, no wheezing, rhonchi or rales  Abd: Soft, nontender, no hepatomegaly  Ext: No edema, pulses 2+ Musculoskeletal: No deformities, BUE and BLE strength normal and equal Skin: Warm and dry, no rashes   Neuro: Alert and oriented to person, place, time, and situation, CNII-XII grossly intact, no focal deficits  Psych: Normal mood and affect   Labs  High Sensitivity Troponin:   Recent Labs  Lab 08/25/21 1153 08/25/21 1426 08/25/21 2110  TROPONINIHS 11 12 11      Cardiac EnzymesNo results for input(s): TROPONINI in the last 168 hours. No results for input(s): TROPIPOC in the last 168 hours.  Chemistry Recent Labs  Lab 08/25/21 1153 08/26/21 0345  NA 141 135  K 3.7 3.5  CL 105 101  CO2 28 24  GLUCOSE 116* 133*  BUN  16 17  °CREATININE 0.86 0.74  °CALCIUM 9.2 8.9  °GFRNONAA >60 >60  °ANIONGAP 8 10  °  °Hematology °Recent Labs  °Lab 08/25/21 °0012 08/25/21 °1153 08/26/21 °0345  °WBC 9.0 7.1 7.1  °RBC 4.00 5.31* 4.84  °HGB 12.7 14.0 13.0  °HCT 39.5 44.8 40.6  °MCV 98.8 84.4 83.9  °MCH 31.8 26.4 26.9  °MCHC 32.2 31.3 32.0  °RDW 14.5 13.7 13.9  °PLT 267 239 209  ° °BNP °Recent Labs  °Lab 08/25/21 °1153  °BNP 328.3*  °  °DDimer No results for input(s): DDIMER in the last 168 hours.  ° °Radiology  °DG Chest 2 View ° °Result Date: 08/25/2021 °CLINICAL DATA:  Shortness of breath, LEFT side chest pain, symptoms for 1 month, getting worse EXAM: CHEST - 2 VIEW COMPARISON:  08/18/2016 FINDINGS: LEFT subclavian sequential transvenous pacemaker leads project at RIGHT atrium and RIGHT ventricle. Normal heart size, mediastinal contours, and pulmonary  vascularity. Mild chronic bronchitic changes with minimal chronic bibasilar atelectasis. No acute infiltrate, pleural effusion or pneumothorax. Bones demineralized. IMPRESSION: Mild chronic bronchitic changes and minimal bibasilar atelectasis. No acute abnormalities. Electronically Signed   By: Mark  Boles M.D.   On: 08/25/2021 12:29  ° °CUP PACEART INCLINIC DEVICE CHECK ° °Result Date: 08/25/2021 °Pacemaker check in clinic. Normal device function. Thresholds, sensing, impedances consistent with previous measurements. Device programmed to maximize longevity. No mode switch or high ventricular rates noted. Device programmed at appropriate safety margins. Histogram distribution appropriate for patient activity level. Device programmed to optimize intrinsic conduction. Estimated longevity _9 years__. Patient enrolled in remote follow-up. Patient education completed. She has no R waves today at 40, but only VP 68%  ° °Cardiac Studies  °Echo pending  ° °Patient Profile  °[name] [name] is a 75 y.o. female with Cad s/p pci, HTN, DM, Afib, CHB with ppm admitted 08/25/2021 with unstable angina.  ° °Assessment & Plan  ° °#Unstable Angina °#CAD s/p PCI °-Admitted with 6 to 8 weeks of progressively worsening chest tightness with exertion.  Radiates into the arm.  Having symptoms at rest as well.  Symptoms concerning for unstable angina per team yesterday. °-Prior history of CAD status post PCI. °-Troponins are negative.  EKG is V paced. °-No further chest pain. °-Continue aspirin and heparin drip.  N.p.o. for left heart cath today.  Risk and benefits explained.  She is willing to proceed.  See consent statement below. °-Continue beta-blocker.  She is on high intensity statin. °-LDL 68.  A1c 7.1.  TSH 1.57. °-Last dose of Xarelto was Monday night.  Held last night.  Okay for cath today. °-echo pending  ° °Shared Decision Making/Informed Consent °The risks [stroke (1 in 1000), death (1 in 1000), kidney failure [usually  temporary] (1 in 500), bleeding (1 in 200), allergic reaction [possibly serious] (1 in 200)], benefits (diagnostic support and management of coronary artery disease) and alternatives of a cardiac catheterization were discussed in detail with Ms. Tumlin and she is willing to proceed. ° °#DM °-Hold home metformin.  A1c 7.1. °-Sliding scale insulin. ° °#HLD °-Home statin. ° °#CHB s/p ppm °-No issues with pacemaker. ° °#pAF °-Hold Xarelto for heart cath.  Restart after for interventional cardiology. °-Complete heart block V paced. ° °FEN °-pre cath IVF °-diet: NPO °-code: full °-dvt ppx: heparin drip ° °For questions or updates, please contact [facility] °Please consult [email or web address] for contact info under  ° °Time Spent with Patient: I have spent a total of 35 minutes with patient reviewing   hospital notes, telemetry, EKGs, labs and examining the patient as well as establishing an assessment and plan that was discussed with the patient.  > 50% of time was spent in direct patient care. °   °Signed, °[place], MD, FACC °[place]   [facility]  °08/26/2021 7:28 AM  ° °

## 2021-08-26 NOTE — Plan of Care (Signed)

## 2021-08-26 NOTE — Op Note (Signed)
CARDIOVASCULAR SURGERY OPERATIVE NOTE  08/26/2021  Surgeon:  Alleen Borne, MD  First Assistant: Lowella Dandy,  PA-C:   An experienced assistant was required given the complexity of this surgery and the standard of surgical care. The assistant was needed for exposure, dissection, suctioning, retraction of delicate tissues and sutures, instrument exchange and for overall help during this procedure.   Preoperative Diagnosis:  Severe multi-vessel coronary artery disease   Postoperative Diagnosis:  Same   Procedure: Emergent  Median Sternotomy Extracorporeal circulation 3.   Coronary artery bypass grafting x 3  Left internal mammary artery graft to the LAD SVG to OM1 SVG to OM2  4.   Endoscopic vein harvest from the right leg   Anesthesia:  General Endotracheal   Clinical History/Surgical Indication:  The patient is a 75 year old woman with a hx of CAD s/p LAD stenting in 2008, HTN, HLD, DM and atrial fib on Xarelto, advanced heart block s/p PPM who presented to Dr. Lubertha Basque office yesterday reporting a one month hx of frequent chest pain with and without exertion, shortness of breath and fatigue. Troponins were 11,12, 11. Cath today showed 99% distal LM with 75% ostial LCX and 90% ostial OM1. LAD stent was patent. RCA had 20% ostial narrowing. LVEF normal. Given her anatomy and frequent symptoms at rest it was felt that emergent CABG was indicated. I discussed the operative procedure with the patient and her daughter-in-law by telephone including alternatives, benefits and risks; including but not limited to bleeding, blood transfusion, infection, stroke, myocardial infarction, graft failure, heart block requiring a permanent pacemaker, organ dysfunction, and death.  Orbie Pyo understands and agrees to proceed.    Preparation:  The patient was seen in the preoperative holding area  and the correct patient, correct operation were confirmed with the patient after reviewing the medical record and catheterization. The consent was signed by me. Preoperative antibiotics were given. A pulmonary arterial line and radial arterial line were placed by the anesthesia team. The patient was taken back to the operating room and positioned supine on the operating room table. After being placed under general endotracheal anesthesia by the anesthesia team a foley catheter was placed. The neck, chest, abdomen, and both legs were prepped with betadine soap and solution and draped in the usual sterile manner. A surgical time-out was taken and the correct patient and operative procedure were confirmed with the nursing and anesthesia staff.   Cardiopulmonary Bypass:  A median sternotomy was performed. The pericardium was opened in the midline. Right ventricular function appeared normal. The ascending aorta was of normal size and had no palpable plaque. There were no contraindications to aortic cannulation or cross-clamping. The patient was fully systemically heparinized and the ACT was maintained > 400 sec. The proximal aortic arch was cannulated with a 6F aortic cannula for arterial inflow. Venous cannulation was performed via the right atrial appendage using a two-staged venous cannula. An antegrade cardioplegia/vent cannula was inserted into the mid-ascending aorta. Aortic occlusion was performed with a single cross-clamp. Systemic cooling to 32 degrees Centigrade and topical cooling of the heart with iced saline were used. Hyperkalemic antegrade cold blood cardioplegia was used to induce diastolic arrest and was then given at about 20 minute intervals throughout the period of arrest to maintain myocardial temperature at or below 10 degrees centigrade. A temperature probe was inserted into the interventricular septum and an insulating pad was placed in the pericardium.   Left internal mammary artery  harvest:  The  left side of the sternum was retracted using the Rultract retractor. The left internal mammary artery was harvested as a pedicle graft. All side branches were clipped. It was a medium-sized vessel of good quality with excellent blood flow. It was ligated distally and divided. It was sprayed with topical papaverine solution to prevent vasospasm.   Endoscopic vein harvest:  The right greater saphenous vein was harvested endoscopically through a 2 cm incision medial to the right knee. It was harvested from the upper thigh to below the knee. It was a medium-sized vein of good quality. The side branches were all ligated with 4-0 silk ties.    Coronary arteries:  The coronary arteries were examined.  LAD:  Large vessel with no distal disease LCX:  OM1 and OM2 both intramyocardial but located proximally just below the surface and were large vessels. RCA:  diffusely diseased with plaque but no significant stenosis on cath.   Grafts:  LIMA to the LAD: 2.5 mm. It was sewn end to side using 8-0 prolene continuous suture. SVG to OM1:  2.5 mm. It was sewn end to side using 7-0 prolene continuous suture. SVG to OM2:  2.5 mm. It was sewn end to side using 7-0 prolene continuous suture.   The proximal vein graft anastomoses were performed to the mid-ascending aorta using continuous 6-0 prolene suture. Graft markers were placed around the proximal anastomoses.   Completion:  The patient was rewarmed to 37 degrees Centigrade. The clamp was removed from the LIMA pedicle and there was rapid warming of the septum and return of ventricular fibrillation. The crossclamp was removed with a time of 52 minutes. There was spontaneous return of sinus rhythm. The distal and proximal anastomoses were checked for hemostasis. The position of the grafts was satisfactory. Two temporary epicardial pacing wires were placed on the right atrium and two on the right ventricle. The patient was weaned from CPB  without difficulty on no inotropes. CPB time was 68 minutes. Cardiac output was 5 LPM. TEE showed normal LV systolic function. Heparin was fully reversed with protamine and the aortic and venous cannulas removed. Hemostasis was achieved. Mediastinal and left pleural drainage tubes were placed. The sternum was closed with #6 stainless steel wires. The fascia was closed with continuous # 1 vicryl suture. The subcutaneous tissue was closed with 2-0 vicryl continuous suture. The skin was closed with 3-0 vicryl subcuticular suture. All sponge, needle, and instrument counts were reported correct at the end of the case. Dry sterile dressings were placed over the incisions and around the chest tubes which were connected to pleurevac suction. The patient was then transported to the surgical intensive care unit in stable condition.

## 2021-08-26 NOTE — Progress Notes (Signed)
Pt flipped to CPAP/PS by RT per RWP. Will check ABG in 20 minutes.

## 2021-08-26 NOTE — Progress Notes (Signed)
Pt failed heart wean protocol, per MD place pt back on full support and retry again at 1915.

## 2021-08-26 NOTE — Discharge Instructions (Signed)

## 2021-08-26 NOTE — Brief Op Note (Signed)
08/25/2021 - 08/26/2021  2:21 PM  PATIENT:  [name] [name]  75 y.o. female  PRE-OPERATIVE DIAGNOSIS:  CORONARY ARTERY DISEASE LEFT MAIN DISEASE  POST-OPERATIVE DIAGNOSIS:  CORONARY ARTERY DISEASE LEFT MAIN DISEASE  PROCEDURE:  Procedure(s):  CORONARY ARTERY BYPASS GRAFTING x 3 -LIMA to LAD -SVG to OM 1 -SVG to OM 2  ENDOVEIN HARVEST OF GREATER SAPHENOUS VEIN (Right) -Right Leg (20/10 min)  TRANSESOPHAGEAL ECHOCARDIOGRAM (TEE) (N/A)  APPLICATION OF CELL SAVER (N/A)  SURGEON:  Surgeon(s) and Role:    * Bartle, Payton Doughty, MD - Primary  PHYSICIAN ASSISTANT: Lowella Dandy PA-C   ASSISTANTS: Elissa Lovett RNFA   ANESTHESIA:   general  EBL:  850 mL   BLOOD ADMINISTERED:   CC CELLSAVER  DRAINS:  Mediastinal Chest Drains, Left Pleural Chest Tube, JP Drain Right leg    LOCAL MEDICATIONS USED:  NONE  SPECIMEN:  Source of Specimen:     DISPOSITION OF SPECIMEN:  N/A  COUNTS:  YES  TOURNIQUET:  * No tourniquets in log *  DICTATION: .Dragon Dictation  PLAN OF CARE: Admit to inpatient   PATIENT DISPOSITION:  ICU - intubated and hemodynamically stable.   Delay start of Pharmacological VTE agent (>24hrs) due to surgical blood loss or risk of bleeding: yes

## 2021-08-26 NOTE — Progress Notes (Signed)
ANTICOAGULATION CONSULT NOTE   Pharmacy Consult for Heparin Indication: atrial fibrillation  Allergies  Allergen Reactions   Propoxyphene Rash and Other (See Comments)    Darvocet     Patient Measurements:   Heparin Dosing Weight: 74.8 kg  Vital Signs: BP: 123/72 (12/14 0300) Pulse Rate: 60 (12/14 0300)  Labs: Recent Labs    08/25/21 0012 08/25/21 1153 08/25/21 1426 08/25/21 1644 08/25/21 2110 08/26/21 0345  HGB 12.7 14.0  --   --   --  13.0  HCT 39.5 44.8  --   --   --  40.6  PLT 267 239  --   --   --  209  APTT  --   --   --  30  --  70*  LABPROT  --   --   --  15.0  --   --   INR  --   --   --  1.2  --   --   HEPARINUNFRC  --   --   --  >1.10*  --  0.89*  CREATININE  --  0.86  --   --   --  0.74  CKTOTAL 30*  --   --   --   --   --   TROPONINIHS  --  11 12  --  11  --      Estimated Creatinine Clearance: 65.7 mL/min (by C-G formula based on SCr of 0.74 mg/dL).   Medical History: Past Medical History:  Diagnosis Date   Atrial fibrillation (HCC)    AV block, 2nd degree 08/2016   Constipation    Coronary artery disease    Cystocele with prolapse    Cystocele with rectocele    Diabetes mellitus without complication (HCC)    type 2   GERD (gastroesophageal reflux disease)    Headache    sinus migraine occ   High cholesterol    Hypertension    Presence of permanent cardiac pacemaker 08/17/2016   [employer]   Wears glasses     Medications:  (Not in a hospital admission)  Scheduled:   aspirin EC  81 mg Oral Daily   atenolol  25 mg Oral q morning   cholecalciferol  2,000 Units Oral q morning   insulin aspart  0-15 Units Subcutaneous TID WC   nitroGLYCERIN  0.4 mg Transdermal Daily   pantoprazole  40 mg Oral Daily   rosuvastatin  40 mg Oral QHS   sodium chloride flush  3 mL Intravenous Q12H   Infusions:   sodium chloride     sodium chloride     heparin 1,200 Units/hr (08/25/21 1616)   PRN:   Assessment: 42 yof with a history of  CAD (PCI 2008), HTN, HLD, DM, Afib, advanced heart block w/PPM. Patient is presenting with chest pain. Heparin per pharmacy consult placed for atrial fibrillation. Patient is on xarelto prior to arrival. Last dose 12/12 @ 2200. Cards planning for cath today -aPTT at goal   Goal of Therapy:  Heparin level 0.3-0.7 units/ml aPTT 66-102 seconds Monitor platelets by anticoagulation protocol: Yes   Plan:  -Continue heparin at 1200 units/hr -Will follow plans post cath  Harland German, PharmD Clinical Pharmacist **Pharmacist phone directory can now be found on [email or web address] ([email or web address]).  Listed under [facility].

## 2021-08-26 NOTE — Interval H&P Note (Signed)
History and Physical Interval Note:  08/26/2021 8:49 AM  [name] [name]  has presented today for surgery, with the diagnosis of Unstable Angina.  The various methods of treatment have been discussed with the patient and family. After consideration of risks, benefits and other options for treatment, the patient has consented to  Procedure(s): LEFT HEART CATH AND CORONARY ANGIOGRAPHY (N/A)  PERCUTANEOUS CORONARY INTEVENTION  as a surgical intervention.  The patient's history has been reviewed, patient examined, no change in status, stable for surgery.  I have reviewed the patient's chart and labs.  Questions were answered to the patient's satisfaction.    Cath Lab Visit (complete for each Cath Lab visit)  Clinical Evaluation Leading to the Procedure:   ACS: Yes.    Non-ACS:    Anginal Classification: CCS IV  Anti-ischemic medical therapy: Minimal Therapy (1 class of medications)  Non-Invasive Test Results: No non-invasive testing performed  Prior CABG: No previous CABG   Bryan Lemma

## 2021-08-27 ENCOUNTER — Encounter (HOSPITAL_COMMUNITY): Payer: Self-pay | Admitting: Surgery

## 2021-08-27 ENCOUNTER — Other Ambulatory Visit (HOSPITAL_COMMUNITY): Payer: Self-pay

## 2021-08-27 ENCOUNTER — Inpatient Hospital Stay (HOSPITAL_COMMUNITY): Payer: Medicare Other

## 2021-08-27 LAB — CBC
HCT: 32.1 % — ABNORMAL LOW (ref 36.0–46.0)
HCT: 31.1 % — ABNORMAL LOW (ref 36.0–46.0)
Hemoglobin: 10.2 g/dL — ABNORMAL LOW (ref 12.0–15.0)
Hemoglobin: 10 g/dL — ABNORMAL LOW (ref 12.0–15.0)
MCH: 27.2 pg (ref 26.0–34.0)
MCH: 27.4 pg (ref 26.0–34.0)
MCHC: 31.8 g/dL (ref 30.0–36.0)
MCHC: 32.2 g/dL (ref 30.0–36.0)
MCV: 85.6 fL (ref 80.0–100.0)
MCV: 85.2 fL (ref 80.0–100.0)
Platelets: 166 10*3/uL (ref 150–400)
Platelets: 168 10*3/uL (ref 150–400)
RBC: 3.75 MIL/uL — ABNORMAL LOW (ref 3.87–5.11)
RBC: 3.65 MIL/uL — ABNORMAL LOW (ref 3.87–5.11)
RDW: 14.3 % (ref 11.5–15.5)
RDW: 14.4 % (ref 11.5–15.5)
WBC: 14.3 10*3/uL — ABNORMAL HIGH (ref 4.0–10.5)
WBC: 14.8 10*3/uL — ABNORMAL HIGH (ref 4.0–10.5)
nRBC: 0 % (ref 0.0–0.2)
nRBC: 0 % (ref 0.0–0.2)

## 2021-08-27 LAB — POCT I-STAT 7, (LYTES, BLD GAS, ICA,H+H)
Acid-base deficit: 3 mmol/L — ABNORMAL HIGH (ref 0.0–2.0)
Acid-base deficit: 3 mmol/L — ABNORMAL HIGH (ref 0.0–2.0)
Bicarbonate: 22.6 mmol/L (ref 20.0–28.0)
Bicarbonate: 23.3 mmol/L (ref 20.0–28.0)
Calcium, Ion: 1.17 mmol/L (ref 1.15–1.40)
Calcium, Ion: 1.18 mmol/L (ref 1.15–1.40)
HCT: 29 % — ABNORMAL LOW (ref 36.0–46.0)
HCT: 30 % — ABNORMAL LOW (ref 36.0–46.0)
Hemoglobin: 10.2 g/dL — ABNORMAL LOW (ref 12.0–15.0)
Hemoglobin: 9.9 g/dL — ABNORMAL LOW (ref 12.0–15.0)
O2 Saturation: 95 %
O2 Saturation: 96 %
Patient temperature: 37.6
Patient temperature: 37.7
Potassium: 3.9 mmol/L (ref 3.5–5.1)
Potassium: 3.9 mmol/L (ref 3.5–5.1)
Sodium: 142 mmol/L (ref 135–145)
Sodium: 142 mmol/L (ref 135–145)
TCO2: 24 mmol/L (ref 22–32)
TCO2: 25 mmol/L (ref 22–32)
pCO2 arterial: 44.8 mmHg (ref 32.0–48.0)
pCO2 arterial: 47.9 mmHg (ref 32.0–48.0)
pH, Arterial: 7.299 — ABNORMAL LOW (ref 7.350–7.450)
pH, Arterial: 7.315 — ABNORMAL LOW (ref 7.350–7.450)
pO2, Arterial: 87 mmHg (ref 83.0–108.0)
pO2, Arterial: 91 mmHg (ref 83.0–108.0)

## 2021-08-27 LAB — BASIC METABOLIC PANEL
Anion gap: 6 (ref 5–15)
Anion gap: 7 (ref 5–15)
BUN: 10 mg/dL (ref 8–23)
BUN: 14 mg/dL (ref 8–23)
CO2: 23 mmol/L (ref 22–32)
CO2: 24 mmol/L (ref 22–32)
Calcium: 7.7 mg/dL — ABNORMAL LOW (ref 8.9–10.3)
Calcium: 7.8 mg/dL — ABNORMAL LOW (ref 8.9–10.3)
Chloride: 110 mmol/L (ref 98–111)
Chloride: 104 mmol/L (ref 98–111)
Creatinine, Ser: 0.75 mg/dL (ref 0.44–1.00)
Creatinine, Ser: 0.83 mg/dL (ref 0.44–1.00)
GFR, Estimated: >60 mL/min (ref >60)
GFR, Estimated: >60 mL/min (ref >60)
Glucose, Bld: 138 mg/dL — ABNORMAL HIGH (ref 70–99)
Glucose, Bld: 165 mg/dL — ABNORMAL HIGH (ref 70–99)
Potassium: 3.9 mmol/L (ref 3.5–5.1)
Potassium: 3.9 mmol/L (ref 3.5–5.1)
Sodium: 139 mmol/L (ref 135–145)
Sodium: 135 mmol/L (ref 135–145)

## 2021-08-27 LAB — GLUCOSE, CAPILLARY
Glucose-Capillary: 100 mg/dL — ABNORMAL HIGH (ref 70–99)
Glucose-Capillary: 112 mg/dL — ABNORMAL HIGH (ref 70–99)
Glucose-Capillary: 122 mg/dL — ABNORMAL HIGH (ref 70–99)
Glucose-Capillary: 126 mg/dL — ABNORMAL HIGH (ref 70–99)
Glucose-Capillary: 138 mg/dL — ABNORMAL HIGH (ref 70–99)
Glucose-Capillary: 138 mg/dL — ABNORMAL HIGH (ref 70–99)
Glucose-Capillary: 146 mg/dL — ABNORMAL HIGH (ref 70–99)
Glucose-Capillary: 156 mg/dL — ABNORMAL HIGH (ref 70–99)
Glucose-Capillary: 158 mg/dL — ABNORMAL HIGH (ref 70–99)
Glucose-Capillary: 163 mg/dL — ABNORMAL HIGH (ref 70–99)

## 2021-08-27 LAB — MAGNESIUM
Magnesium: 2.5 mg/dL — ABNORMAL HIGH (ref 1.7–2.4)
Magnesium: 2.2 mg/dL (ref 1.7–2.4)

## 2021-08-27 MED ORDER — TRAMADOL HCL 50 MG PO TABS
50.0000 mg | ORAL_TABLET | ORAL | Status: DC | PRN
  Administered 2021-08-27 (×2): 50 mg via ORAL
  Filled 2021-08-27 (×2): qty 1

## 2021-08-27 MED ORDER — INSULIN DETEMIR 100 UNIT/ML ~~LOC~~ SOLN: 20.0000 [IU] | Freq: Every day | SUBCUTANEOUS | Status: DC

## 2021-08-27 MED ORDER — ATENOLOL 25 MG PO TABS
25.0000 mg | ORAL_TABLET | Freq: Every day | ORAL | Status: DC
  Administered 2021-08-27 – 2021-08-29 (×3): 25 mg via ORAL
  Filled 2021-08-27 (×3): qty 1

## 2021-08-27 MED ORDER — FUROSEMIDE 10 MG/ML IJ SOLN
40.0000 mg | Freq: Two times a day (BID) | INTRAMUSCULAR | Status: AC
  Administered 2021-08-27 (×2): 40 mg via INTRAVENOUS
  Filled 2021-08-27 (×2): qty 4

## 2021-08-27 MED ORDER — ENOXAPARIN SODIUM 40 MG/0.4ML IJ SOSY
40.0000 mg | PREFILLED_SYRINGE | Freq: Every day | INTRAMUSCULAR | Status: DC
  Administered 2021-08-27 – 2021-08-29 (×3): 40 mg via SUBCUTANEOUS
  Filled 2021-08-27 (×3): qty 0.4

## 2021-08-27 MED ORDER — ORAL CARE MOUTH RINSE
15.0000 mL | Freq: Two times a day (BID) | OROMUCOSAL | Status: DC
  Administered 2021-08-27 – 2021-08-28 (×3): 15 mL via OROMUCOSAL

## 2021-08-27 MED ORDER — INSULIN ASPART 100 UNIT/ML IJ SOLN
0.0000 [IU] | Freq: Three times a day (TID) | INTRAMUSCULAR | Status: DC
  Administered 2021-08-28 (×3): 2 [IU] via SUBCUTANEOUS

## 2021-08-27 MED ORDER — POTASSIUM CHLORIDE CRYS ER 20 MEQ PO TBCR
20.0000 meq | EXTENDED_RELEASE_TABLET | Freq: Three times a day (TID) | ORAL | Status: AC
  Administered 2021-08-27 (×2): 20 meq via ORAL
  Filled 2021-08-27 (×2): qty 1

## 2021-08-27 MED ORDER — POTASSIUM CHLORIDE CRYS ER 10 MEQ PO TBCR
EXTENDED_RELEASE_TABLET | ORAL | Status: AC
  Filled 2021-08-27: qty 1

## 2021-08-27 MED ORDER — INSULIN DETEMIR 100 UNIT/ML ~~LOC~~ SOLN
20.0000 [IU] | Freq: Every day | SUBCUTANEOUS | Status: DC
  Administered 2021-08-27 – 2021-08-30 (×4): 20 [IU] via SUBCUTANEOUS
  Filled 2021-08-27 (×4): qty 0.2

## 2021-08-27 MED ORDER — INSULIN ASPART 100 UNIT/ML IJ SOLN
0.0000 [IU] | INTRAMUSCULAR | Status: DC
Start: 2021-08-27 — End: 2021-08-27
  Administered 2021-08-27 (×2): 2 [IU] via SUBCUTANEOUS

## 2021-08-27 NOTE — TOC Benefit Eligibility Note (Signed)
Patient Product/process development scientist completed.    The patient is currently admitted and upon discharge could be taking Jardiance 10 mg.  The current 30 day co-pay is, $152.57.   The patient is insured through [employer]     Roland Earl, CPhT Pharmacy Patient Advocate Specialist [facility] Patient Advocate Team Direct Number: [phone]  Fax: [phone]

## 2021-08-27 NOTE — Progress Notes (Incomplete)
Pulled pacing wires at 1200 per order. Wires intact upon removal. Pt tolerated wire removal well and is on bed rest until 1300

## 2021-08-27 NOTE — Progress Notes (Incomplete)
Pulled 3 chest tubes at 1500 per MD order.

## 2021-08-27 NOTE — Progress Notes (Signed)
Patient passed all extubation criteria for an successful extubation.   Pulmonary Mechanics: NIF: -38 FVC: 1.22L  Patient has an audible cuff leak.   Law Corsino L. Katrinka Blazing, BS, RRT-ACCS, RCP

## 2021-08-27 NOTE — Progress Notes (Signed)
Pt extubated to 4L [facility] per RWP. Able to cough and verbalize name on command. Educated w/return demonstration with IS.

## 2021-08-27 NOTE — Progress Notes (Signed)
Cardiology Progress Note  Patient ID: [name] [name] MRN: [id number] DOB: 08-May-1946 Date of Encounter: 08/27/2021  Primary Cardiologist: Evalina Field, MD  Subjective   Chief Complaint: Chest soreness/SOB  HPI: Doing well after surgery.  In the ICU.  ROS:  All other ROS reviewed and negative. Pertinent positives noted in the HPI.     Inpatient Medications  Scheduled Meds:  acetaminophen  1,000 mg Oral Q6H   Or   acetaminophen (TYLENOL) oral liquid 160 mg/5 mL  1,000 mg Per Tube Q6H   aspirin EC  325 mg Oral Daily   Or   aspirin  324 mg Per Tube Daily   atenolol  25 mg Oral Daily   bisacodyl  10 mg Oral Daily   Or   bisacodyl  10 mg Rectal Daily   Chlorhexidine Gluconate Cloth  6 each Topical Daily   docusate sodium  200 mg Oral Daily   enoxaparin (LOVENOX) injection  40 mg Subcutaneous QHS   furosemide  40 mg Intravenous BID   insulin aspart  0-24 Units Subcutaneous Q4H   insulin detemir  20 Units Subcutaneous Daily   mouth rinse  15 mL Mouth Rinse BID   [START ON 08/28/2021] pantoprazole  40 mg Oral Daily   potassium chloride  20 mEq Oral TID   rosuvastatin  40 mg Oral QHS   sodium chloride flush  3 mL Intravenous Q12H   Continuous Infusions:  sodium chloride 20 mL/hr at 08/27/21 1000   sodium chloride     sodium chloride 10 mL/hr at 08/27/21 1000    ceFAZolin (ANCEF) IV Stopped (08/27/21 0655)   lactated ringers     lactated ringers 20 mL/hr at 08/27/21 1000   nitroGLYCERIN 15 mcg/min (08/27/21 1000)   PRN Meds: sodium chloride, dextrose, metoprolol tartrate, morphine injection, ondansetron (ZOFRAN) IV, oxyCODONE, sodium chloride flush, traMADol   Vital Signs   Vitals:   08/27/21 0948 08/27/21 1000 08/27/21 1015 08/27/21 1030  BP:      Pulse: 83 75 67 66  Resp: 16 16 10 16   Temp: 99.5 F (37.5 C) 99.5 F (37.5 C) 99.5 F (37.5 C) 99.5 F (37.5 C)  TempSrc:      SpO2: 97% 99% 99% 99%  Weight:      Height:        Intake/Output Summary (Last  24 hours) at 08/27/2021 1129 Last data filed at 08/27/2021 1000 Gross per 24 hour  Intake 5588.01 ml  Output 5655 ml  Net -66.99 ml   Last 3 Weights 08/27/2021 08/25/2021 12/08/2020  Weight (lbs) 189 lb 2.5 oz 183 lb 6.4 oz 175 lb 3.2 oz  Weight (kg) 85.8 kg 83.19 kg 79.47 kg      Telemetry  Overnight telemetry shows sinus rhythm with V paced, which I personally reviewed.   Physical Exam   Vitals:   08/27/21 0948 08/27/21 1000 08/27/21 1015 08/27/21 1030  BP:      Pulse: 83 75 67 66  Resp: 16 16 10 16   Temp: 99.5 F (37.5 C) 99.5 F (37.5 C) 99.5 F (37.5 C) 99.5 F (37.5 C)  TempSrc:      SpO2: 97% 99% 99% 99%  Weight:      Height:        Intake/Output Summary (Last 24 hours) at 08/27/2021 1129 Last data filed at 08/27/2021 1000 Gross per 24 hour  Intake 5588.01 ml  Output 5655 ml  Net -66.99 ml    Last 3 Weights 08/27/2021 08/25/2021 12/08/2020  Weight (lbs) 189 lb 2.5 oz 183 lb 6.4 oz 175 lb 3.2 oz  Weight (kg) 85.8 kg 83.19 kg 79.47 kg    Body mass index is 31.48 kg/m.   General: Well nourished, well developed, in no acute distress Head: Atraumatic, normal size  Eyes: PEERLA, EOMI  Neck: Supple, no JVD Endocrine: No thryomegaly Cardiac: Normal S1, S2; RRR; no murmurs, pericardial rub noted Lungs: Diminished breath sounds bilaterally Abd: Soft, nontender, no hepatomegaly  Ext: Trace edema Musculoskeletal: No deformities, BUE and BLE strength normal and equal Skin: Warm and dry, no rashes   Neuro: Alert and oriented to person, place, time, and situation, CNII-XII grossly intact, no focal deficits  Psych: Normal mood and affect   Labs  High Sensitivity Troponin:   Recent Labs  Lab 08/25/21 1153 08/25/21 1426 08/25/21 2110 08/26/21 1024  TROPONINIHS 11 12 11 8      Cardiac EnzymesNo results for input(s): TROPONINI in the last 168 hours. No results for input(s): TROPIPOC in the last 168 hours.  Chemistry Recent Labs  Lab 08/26/21 0345  08/26/21 1030 08/26/21 1427 08/26/21 1557 08/26/21 2118 08/26/21 2155 08/27/21 0048 08/27/21 0211 08/27/21 0415  NA 135   < > 139   < > 140   < > 142 142 139  K 3.5   < > 4.0   < > 4.1   < > 3.9 3.9 3.9  CL 101   < > 104  --  111  --   --   --  110  CO2 24  --   --   --  23  --   --   --  23  GLUCOSE 133*   < > 112*  --  122*  --   --   --  138*  BUN 17   < > 12  --  10  --   --   --  10  CREATININE 0.74   < > 0.50  --  0.74  --   --   --  0.75  CALCIUM 8.9  --   --   --  7.5*  --   --   --  7.7*  PROT 6.1*  --   --   --   --   --   --   --   --   ALBUMIN 3.4*  --   --   --   --   --   --   --   --   AST 27  --   --   --   --   --   --   --   --   ALT 17  --   --   --   --   --   --   --   --   ALKPHOS 46  --   --   --   --   --   --   --   --   BILITOT 0.4  --   --   --   --   --   --   --   --   GFRNONAA >60  --   --   --  >60  --   --   --  >60  ANIONGAP 10  --   --   --  6  --   --   --  6   < > = values in this interval not displayed.    Hematology Recent Labs  Lab 08/26/21 1555 08/26/21 1557 08/26/21 2118 08/26/21 2155 08/27/21 0048 08/27/21 0211 08/27/21 0415  WBC 9.7  --  17.9*  --   --   --  14.3*  RBC 3.79*  --  3.82*  --   --   --  3.75*  HGB 10.2*   < > 10.1*   < > 9.9* 10.2* 10.2*  HCT 31.9*   < > 32.4*   < > 29.0* 30.0* 32.1*  MCV 84.2  --  84.8  --   --   --  85.6  MCH 26.9  --  26.4  --   --   --  27.2  MCHC 32.0  --  31.2  --   --   --  31.8  RDW 13.9  --  14.2  --   --   --  14.3  PLT 130*  --  196  --   --   --  166   < > = values in this interval not displayed.   BNP Recent Labs  Lab 08/25/21 1153  BNP 328.3*    DDimer No results for input(s): DDIMER in the last 168 hours.   Radiology  DG Chest 2 View  Result Date: 08/25/2021 CLINICAL DATA:  Shortness of breath, LEFT side chest pain, symptoms for 1 month, getting worse EXAM: CHEST - 2 VIEW COMPARISON:  08/18/2016 FINDINGS: LEFT subclavian sequential transvenous pacemaker leads project at  RIGHT atrium and RIGHT ventricle. Normal heart size, mediastinal contours, and pulmonary vascularity. Mild chronic bronchitic changes with minimal chronic bibasilar atelectasis. No acute infiltrate, pleural effusion or pneumothorax. Bones demineralized. IMPRESSION: Mild chronic bronchitic changes and minimal bibasilar atelectasis. No acute abnormalities. Electronically Signed   By: Ulyses Southward M.D.   On: 08/25/2021 12:29   CARDIAC CATHETERIZATION  Result Date: 08/26/2021   Mid LM to Ost LAD lesion is 95% stenosed with 75% stenosed side branch in Ost Cx.   1st Mrg lesion is 90% stenosed.   Prox LAD lesion is 30% stenosed. Mid LAD STENT is 5% stenosed.   Ost RCA lesion is 20% stenosed.   The left ventricular systolic function is normal.  The left ventricular ejection fraction is 55-65% by visual estimate.   LV end diastolic pressure is normal.   There is no aortic valve stenosis. SUMMARY Severe distal LEFT MAIN 95% calcific, eccentric lesion -> excellent distal targets in the LAD and LCx-OM distribution Severe ostial and proximal OM1 90% stenosis (calcified) Mild proximal LAD disease with widely patent mid LAD stent. Normal, dominant RCA with 20% ostial stenosis Normal LVEDP.  Normal EF and wall motion on hand-injection LV gram. RECOMMENDATIONS Urgent CVTS consultation placed. With no active ongoing chest pain and hemodynamically stable, no IABP for now. IV NTG with plans to restart IV heparin 6 hours post sheath removal. Has echo ordered, not done Aggressive CV RF modification with GDMT. Bryan Lemma, MD  DG Chest Port 1 View  Result Date: 08/27/2021 CLINICAL DATA:  Chest tube.  CABG. EXAM: PORTABLE CHEST 1 VIEW COMPARISON:  Yesterday FINDINGS: Tracheal and esophageal extubation. Similar low volume lungs with atelectasis. Chest tubes in place. Pacer leads and Swan-Ganz catheter with tip at the main pulmonary artery. No visible pneumothorax. No pulmonary edema or change in heart size. IMPRESSION: 1.  Unchanged remaining hardware. 2. Stable low volume chest with atelectasis. 3. No visible pneumothorax. Electronically Signed   By: Tiburcio Pea M.D.   On: 08/27/2021 05:53   DG Chest [facility]  Result Date: 08/26/2021 CLINICAL DATA:  Post op CABGx3 - eval for chest tube, swan ganz, et tube EXAM: PORTABLE CHEST - 1 VIEW COMPARISON:  08/25/2021 FINDINGS: Endotracheal tube approximately 2.9 cm above carina. Nasogastric tube extends to the decompressed stomach. Right IJ Swan-Ganz to the proximal right pulmonary artery. Left chest tube in place with no evident pneumothorax. Stable left subclavian dual lead pacemaker. Relatively low lung volumes with some patchy atelectasis/consolidation in the lung bases, possible small left pleural effusion. Heart size and mediastinal contours are within normal limits. CABG markers. Sternotomy wires. IMPRESSION: Interval CABG.  Support hardware in good position. Bibasilar atelectasis with possible small left pleural effusion. Electronically Signed   By: Lucrezia Europe M.D.   On: 08/26/2021 16:26   CUP PACEART INCLINIC DEVICE CHECK  Result Date: 08/25/2021 Pacemaker check in clinic. Normal device function. Thresholds, sensing, impedances consistent with previous measurements. Device programmed to maximize longevity. No mode switch or high ventricular rates noted. Device programmed at appropriate safety margins. Histogram distribution appropriate for patient activity level. Device programmed to optimize intrinsic conduction. Estimated longevity _9 years__. Patient enrolled in remote follow-up. Patient education completed. She has no R waves today at 40, but only VP 68%  ECHO INTRAOPERATIVE TEE  Result Date: 08/26/2021  *INTRAOPERATIVE TRANSESOPHAGEAL REPORT *  Patient Name:   [name] [name] Date of Exam: 08/26/2021 Medical Rec #:  [id number]        Height:       65.0 in Accession #:    [id number]       Weight:       183.4 lb Date of Birth:  10-Aug-1946        BSA:           1.91 m Patient Age:    75 years         BP:           175/86 mmHg Patient Gender: F                HR:           63 bpm. Exam Location:  Anesthesiology Transesophogeal exam was perform intraoperatively during surgical procedure. Patient was closely monitored under general anesthesia during the entirety of examination. Indications:     Coronary Artery Disease Sonographer:     Darlina Sicilian RDCS Performing Phys: [id number] Diagnosing Phys: Annye Asa MD Complications: No known complications during this procedure. POST-OP IMPRESSIONS Limited post-CPB exam: The patient separated easily from CPB. _ Left Ventricle: The left ventricular function is unchanged from pre-bypass images. The LV remains hyperdynamic, with overall EF >70%. There are no regional wall motion abnormalities. _ Right Ventricle: The right ventricular function appears unchanged from pre-bypass images. _ Aortic Valve: The aortic valve function appears unchanged from pre-bypass images. Trivial, central AI remains. _ Mitral Valve: The mitral valve function appears unchanged from pre-bypass images. _ Tricuspid Valve: The tricuspid valve function appears unchanged from pre-bypass images. PRE-OP FINDINGS  Left Ventricle: The left ventricle has hyperdynamic systolic function, with an ejection fraction of >65%, measured >70%. The cavity size was normal. No evidence of left ventricular regional wall motion abnormalities. There is no left ventricular hypertrophy. Left ventricular diastolic function was not evaluated. Right Ventricle: The right ventricle has normal systolic function. The cavity was normal. There is no increase in right ventricular wall thickness. Right ventricular systolic pressure is normal. Catheter present in the right ventricle. Left Atrium: Left atrial size was normal in size. No left atrial/left  atrial appendage thrombus was detected. The left atrial appendage is well visualized and there is no evidence of thrombus present.  Left atrial appendage velocity is normal at greater than 40 cm/s. Right Atrium: Right atrial size was normal in size. Prominent Eustachian valve. Catheter present in the right atrium. Interatrial Septum: No atrial level shunt detected by color flow Doppler. Pericardium: There is no evidence of pericardial effusion. Mitral Valve: The mitral valve is normal in structure. Mitral valve regurgitation is not visualized by color flow Doppler. There is no evidence of mitral valve vegetation. Pulmonary venous flow is normal. There is no evidence of mitral stenosis, with peak gradient 4 mmHg, mean gradient 2 mmHg. Tricuspid Valve: The tricuspid valve was normal in structure. Tricuspid valve regurgitation is mild by color flow Doppler. The jet is directed centrally. No evidence of tricuspid stenosis is present. There is no evidence of tricuspid valve vegetation. Aortic Valve: The aortic valve is tricuspid. Aortic valve regurgitation is mild by color flow Doppler. The jet is centrally-directed. There is no stenosis of the aortic valve, with peak gradient 13 mmHg, mean gradient 6 mmHg. There is no evidence of aortic valve vegetation. Pulmonic Valve: The pulmonic valve was normal in structure, with normal leaflet mobility. No evidence of pulmonic stenosis. Pulmonic valve regurgitation is trivial, around the PA catheter, by color flow Doppler. Aorta: The aortic root, ascending aorta and aortic arch are normal in size and structure. There is evidence of sparse plaque in the descending aorta; Grade I, measuring 1-46mm in size. Pulmonary Artery: [name] catheter present on the right. The pulmonary artery is of normal size. Venous: The inferior vena cava is dilated in size with greater than 50% respiratory variability, suggesting right atrial pressure of 8 mmHg. Shunts: There is no evidence of an atrial septal defect, by color Doppler interrogation. +-------------+--------++  AORTIC VALVE             +-------------+--------++  AV  Mean Grad: 6.0 mmHg   +-------------+--------++ +-------------+--------++  MITRAL VALVE             +-------------+--------++  MV Mean grad: 2.0 mmHg   +-------------+--------++  Annye Asa MD Electronically signed by Annye Asa MD Signature Date/Time: 08/26/2021/3:53:49 PM    Final     Cardiac Studies  LHC 08/26/2021 Severe distal LEFT MAIN 95% calcific, eccentric lesion -> excellent distal targets in the LAD and LCx-OM distribution Severe ostial and proximal OM1 90% stenosis (calcified) Mild proximal LAD disease with widely patent mid LAD stent. Normal, dominant RCA with 20% ostial stenosis Normal LVEDP.   Normal EF and wall motion on hand-injection LV gram.  Patient Profile  ASHTAN PAYNTER is a 75 y.o. female with Cad s/p pci, HTN, DM, Afib, CHB with ppm admitted 08/25/2021 with unstable angina.   Assessment & Plan   #Unstable angina #Critical left main disease -Found to have 95% distal left main disease.  Went urgently for CABG yesterday.  Status post LIMA to LAD and vein graft to OM 1 and 2. -Currently in the ICU.  Appears to be extubated and recovering.  Overall doing quite well. -We will continue aspirin as you are doing.  Continue high intensity statin. -Add back beta-blocker as you are able. -Echo showed normal LV function.  #Paroxysmal atrial fibrillation -Maintaining sinus rhythm. -Continue beta-blocker as you are able. -Resume Xarelto at the discretion of surgery.  #CHB status post pacemaker -No issues.  Cardiology will follow along remotely.  We will make her a follow-up appointment  in our office when she is closer to discharge.  Please call with questions.  For questions or updates, please contact [place] Please consult [email or web address] for contact info under   Time Spent with Patient: I have spent a total of 25 minutes with patient reviewing hospital notes, telemetry, EKGs, labs and examining the patient as well as establishing an assessment and  plan that was discussed with the patient.  > 50% of time was spent in direct patient care.    Signed, Addison Naegeli. Audie Box, MD, [place]  08/27/2021 11:29 AM

## 2021-08-27 NOTE — Progress Notes (Signed)
EVENING ROUNDS NOTE :     [address]       [place] [address]             [phone]                 1 Day Post-Op Procedure(s) (LRB): CORONARY ARTERY BYPASS GRAFTING (CABG) TIMES 3 USING LEFT INTERNAL MAMMARY ARTERY AND GREATER SAPHENOUS VEIN HARVESTED ENDOSCOPICALLY. LIMA TO LAD, SVG TO OM1, SVG TO OM2 (N/A) TRANSESOPHAGEAL ECHOCARDIOGRAM (TEE) (N/A) ENDOVEIN HARVEST OF GREATER SAPHENOUS VEIN (Right) APPLICATION OF CELL SAVER (N/A)   Total Length of Stay:  LOS: 1 day  Events:   No events Off all gtts. Up to chair    BP 114/63    Pulse 66    Temp 98.1 F (36.7 C) (Oral)    Resp 12    Ht 5\' 5"  (1.651 m)    Wt 85.8 kg    SpO2 97%    BMI 31.48 kg/m   PAP: (11-39)/(3-23) 28/12 CO:  [3.9 L/min-6.8 L/min] 3.9 L/min CI:  [2 L/min/m2-3.6 L/min/m2] 2 L/min/m2  Vent Mode: CPAP;PSV FiO2 (%):  [40 %-50 %] 40 % Set Rate:  [4 bmp-16 bmp] 4 bmp Vt Set:  [450 mL] 450 mL PEEP:  [5 cmH20] 5 cmH20 Pressure Support:  [10 cmH20] 10 cmH20 Plateau Pressure:  [15 cmH20] 15 cmH20   sodium chloride Stopped (08/27/21 1208)   sodium chloride     sodium chloride 10 mL/hr at 08/27/21 1700    ceFAZolin (ANCEF) IV Stopped (08/27/21 1330)   lactated ringers     lactated ringers 20 mL/hr at 08/27/21 1700   nitroGLYCERIN Stopped (08/27/21 1038)    I/O last 3 completed shifts: In: 5661.3 [P.O.:50; I.V.:3447.4; Blood:520; NG/GT:100; IV Piggyback:1543.8] Out: 4630 [Urine:3380; Drains:50; Blood:850; Chest Tube:350]   CBC Latest Ref Rng & Units 08/27/2021 08/27/2021 08/27/2021  WBC 4.0 - 10.5 K/uL 14.3(H) - -  Hemoglobin 12.0 - 15.0 g/dL 10.2(L) 10.2(L) 9.9(L)  Hematocrit 36.0 - 46.0 % 32.1(L) 30.0(L) 29.0(L)  Platelets 150 - 400 K/uL 166 - -    BMP Latest Ref Rng & Units 08/27/2021 08/27/2021 08/27/2021  Glucose 70 - 99 mg/dL 08/29/2021) - -  BUN 8 - 23 mg/dL 10 - -  Creatinine 557(D - 1.00 mg/dL 2.20 - -  Sodium 2.54 - 145 mmol/L 139 142 142  Potassium 3.5 - 5.1 mmol/L 3.9  3.9 3.9  Chloride 98 - 111 mmol/L 110 - -  CO2 22 - 32 mmol/L 23 - -  Calcium 8.9 - 10.3 mg/dL 7.7(L) - -    ABG    Component Value Date/Time   PHART 7.315 (L) 08/27/2021 0211   PCO2ART 44.8 08/27/2021 0211   PO2ART 91 08/27/2021 0211   HCO3 22.6 08/27/2021 0211   TCO2 24 08/27/2021 0211   ACIDBASEDEF 3.0 (H) 08/27/2021 0211   O2SAT 96.0 08/27/2021 0211       08/29/2021, MD 08/27/2021 6:02 PM

## 2021-08-27 NOTE — Procedures (Signed)
Extubation Procedure Note  Patient Details:   Name: [name] [name] DOB: [birth year] MRN: [id number]   Airway Documentation:    Vent end date: 08/27/21 Vent end time: 0106   Evaluation  O2 sats: stable throughout Complications: No apparent complications Patient did tolerate procedure well. Bilateral Breath Sounds: Clear, Diminished   Yes, Ability to speak post-extubation. No inspiratory stridor noted.   Extubation procedure was clearly explained to the patient, patient nodded her head for understanding. Patient passed extubation criteria. Patient was extubation to a 4L [place] w/ bubble humidification.   [name], BS, RRT-ACCS, RCP 08/27/2021, 1:16 AM

## 2021-08-27 NOTE — Progress Notes (Signed)
1 Day Post-Op Procedure(s) (LRB): CORONARY ARTERY BYPASS GRAFTING (CABG) TIMES 3 USING LEFT INTERNAL MAMMARY ARTERY AND GREATER SAPHENOUS VEIN HARVESTED ENDOSCOPICALLY. LIMA TO LAD, SVG TO OM1, SVG TO OM2 (N/A) TRANSESOPHAGEAL ECHOCARDIOGRAM (TEE) (N/A) ENDOVEIN HARVEST OF GREATER SAPHENOUS VEIN (Right) APPLICATION OF CELL SAVER (N/A) Subjective: Sore but otherwise feels ok  Objective: Vital signs in last 24 hours: Temp:  [95.7 F (35.4 C)-99.9 F (37.7 C)] 99.3 F (37.4 C) (12/15 0700) Pulse Rate:  [62-233] 95 (12/15 0700) Cardiac Rhythm: A-V Sequential paced (12/15 0000) Resp:  [7-25] 16 (12/15 0700) BP: (126-186)/(66-89) 137/74 (12/14 1645) SpO2:  [90 %-100 %] 95 % (12/15 0700) Arterial Line BP: (81-174)/(44-77) 150/68 (12/15 0700) FiO2 (%):  [40 %-50 %] 40 % (12/15 0025) Weight:  [85.8 kg] 85.8 kg (12/15 0400)  Hemodynamic parameters for last 24 hours: PAP: (20-39)/(10-23) 22/14 CO:  [4.4 L/min-6.8 L/min] 6.8 L/min CI:  [2.3 L/min/m2-3.6 L/min/m2] 3.6 L/min/m2  Intake/Output from previous day: 12/14 0701 - 12/15 0700 In: 5411.7 [P.O.:50; I.V.:3197.8; Blood:520; NG/GT:100; IV Piggyback:1543.8] Out: 4630 [Urine:3380; Drains:50; Blood:850; Chest Tube:350] Intake/Output this shift: No intake/output data recorded.  General appearance: alert and cooperative Neurologic: intact Heart: regular rate and rhythm, S1, S2 normal, no murmur, click, rub or gallop Lungs: clear to auscultation bilaterally Abdomen: soft, non-tender; bowel sounds normal; no masses,  no organomegaly Extremities: edema mild Wound: dressings dry Chest tube output low, no air leak.  Lab Results: Recent Labs    08/26/21 2118 08/26/21 2155 08/27/21 0211 08/27/21 0415  WBC 17.9*  --   --  14.3*  HGB 10.1*   < > 10.2* 10.2*  HCT 32.4*   < > 30.0* 32.1*  PLT 196  --   --  166   < > = values in this interval not displayed.   BMET:  Recent Labs    08/26/21 2118 08/26/21 2155 08/27/21 0211  08/27/21 0415  NA 140   < > 142 139  K 4.1   < > 3.9 3.9  CL 111  --   --  110  CO2 23  --   --  23  GLUCOSE 122*  --   --  138*  BUN 10  --   --  10  CREATININE 0.74  --   --  0.75  CALCIUM 7.5*  --   --  7.7*   < > = values in this interval not displayed.    PT/INR:  Recent Labs    08/26/21 1555  LABPROT 17.9*  INR 1.5*   ABG    Component Value Date/Time   PHART 7.315 (L) 08/27/2021 0211   HCO3 22.6 08/27/2021 0211   TCO2 24 08/27/2021 0211   ACIDBASEDEF 3.0 (H) 08/27/2021 0211   O2SAT 96.0 08/27/2021 0211   CBG (last 3)  Recent Labs    08/27/21 0210 08/27/21 0402 08/27/21 0629  GLUCAP 138* 138* 112*   CXR: clear  ECG: internally paced.  Assessment/Plan: S/P Procedure(s) (LRB): CORONARY ARTERY BYPASS GRAFTING (CABG) TIMES 3 USING LEFT INTERNAL MAMMARY ARTERY AND GREATER SAPHENOUS VEIN HARVESTED ENDOSCOPICALLY. LIMA TO LAD, SVG TO OM1, SVG TO OM2 (N/A) TRANSESOPHAGEAL ECHOCARDIOGRAM (TEE) (N/A) ENDOVEIN HARVEST OF GREATER SAPHENOUS VEIN (Right) APPLICATION OF CELL SAVER (N/A)  POD 1 Hemodynamically stable in paced rhythm via internal pacer. Resume her atenolol. She has hx of atrial fib. Will decide about resuming Xarelto at discharge. Resume lisinopril if BP remains elevated after diuresis.  DC temp pacing wires, chest tubes, swan, arterial line and  leg JP.  Diurese.  DM: glucose under control. Transition to Levemir and SSI today.  IS, OOB, ambulate.     LOS: 1 day    Alleen Borne 08/27/2021

## 2021-08-28 ENCOUNTER — Inpatient Hospital Stay (HOSPITAL_COMMUNITY): Payer: Medicare Other

## 2021-08-28 LAB — GLUCOSE, CAPILLARY
Glucose-Capillary: 136 mg/dL — ABNORMAL HIGH (ref 70–99)
Glucose-Capillary: 155 mg/dL — ABNORMAL HIGH (ref 70–99)
Glucose-Capillary: 136 mg/dL — ABNORMAL HIGH (ref 70–99)
Glucose-Capillary: 112 mg/dL — ABNORMAL HIGH (ref 70–99)

## 2021-08-28 LAB — BASIC METABOLIC PANEL
Anion gap: 7 (ref 5–15)
BUN: 15 mg/dL (ref 8–23)
CO2: 24 mmol/L (ref 22–32)
Calcium: 8.1 mg/dL — ABNORMAL LOW (ref 8.9–10.3)
Chloride: 103 mmol/L (ref 98–111)
Creatinine, Ser: 0.89 mg/dL (ref 0.44–1.00)
GFR, Estimated: >60 mL/min (ref >60)
Glucose, Bld: 127 mg/dL — ABNORMAL HIGH (ref 70–99)
Potassium: 4.2 mmol/L (ref 3.5–5.1)
Sodium: 134 mmol/L — ABNORMAL LOW (ref 135–145)

## 2021-08-28 LAB — CBC
HCT: 30.8 % — ABNORMAL LOW (ref 36.0–46.0)
Hemoglobin: 9.7 g/dL — ABNORMAL LOW (ref 12.0–15.0)
MCH: 27 pg (ref 26.0–34.0)
MCHC: 31.5 g/dL (ref 30.0–36.0)
MCV: 85.8 fL (ref 80.0–100.0)
Platelets: 144 10*3/uL — ABNORMAL LOW (ref 150–400)
RBC: 3.59 MIL/uL — ABNORMAL LOW (ref 3.87–5.11)
RDW: 14.5 % (ref 11.5–15.5)
WBC: 11.6 10*3/uL — ABNORMAL HIGH (ref 4.0–10.5)
nRBC: 0 % (ref 0.0–0.2)

## 2021-08-28 MED ORDER — BISACODYL 5 MG PO TBEC: 10.0000 mg | DELAYED_RELEASE_TABLET | Freq: Every day | ORAL | Status: DC | PRN

## 2021-08-28 MED ORDER — SODIUM CHLORIDE 0.9% FLUSH: 3.0000 mL | INTRAVENOUS | Status: DC | PRN

## 2021-08-28 MED ORDER — SENNOSIDES-DOCUSATE SODIUM 8.6-50 MG PO TABS: 1.0000 | ORAL_TABLET | Freq: Two times a day (BID) | ORAL | Status: DC | PRN

## 2021-08-28 MED ORDER — CHLORHEXIDINE GLUCONATE CLOTH 2 % EX PADS
6.0000 | MEDICATED_PAD | Freq: Every day | CUTANEOUS | Status: DC
  Administered 2021-08-28: 6 via TOPICAL

## 2021-08-28 MED ORDER — EZETIMIBE 10 MG PO TABS
10.0000 mg | ORAL_TABLET | Freq: Every day | ORAL | Status: DC
  Administered 2021-08-28 – 2021-08-30 (×3): 10 mg via ORAL
  Filled 2021-08-28 (×3): qty 1

## 2021-08-28 MED ORDER — ONDANSETRON HCL 4 MG/2ML IJ SOLN: 4.0000 mg | Freq: Four times a day (QID) | INTRAMUSCULAR | Status: DC | PRN

## 2021-08-28 MED ORDER — BISACODYL 10 MG RE SUPP: 10.0000 mg | Freq: Every day | RECTAL | Status: DC | PRN

## 2021-08-28 MED ORDER — ONDANSETRON HCL 4 MG PO TABS: 4.0000 mg | ORAL_TABLET | Freq: Four times a day (QID) | ORAL | Status: DC | PRN

## 2021-08-28 MED ORDER — FUROSEMIDE 40 MG PO TABS
40.0000 mg | ORAL_TABLET | Freq: Every day | ORAL | Status: DC
  Administered 2021-08-28 – 2021-08-30 (×3): 40 mg via ORAL
  Filled 2021-08-28 (×3): qty 1

## 2021-08-28 MED ORDER — ASPIRIN EC 325 MG PO TBEC
325.0000 mg | DELAYED_RELEASE_TABLET | Freq: Every day | ORAL | Status: DC
  Administered 2021-08-28 – 2021-08-30 (×3): 325 mg via ORAL
  Filled 2021-08-28 (×3): qty 1

## 2021-08-28 MED ORDER — SODIUM CHLORIDE 0.9% FLUSH
3.0000 mL | Freq: Two times a day (BID) | INTRAVENOUS | Status: DC
  Administered 2021-08-28 – 2021-08-29 (×3): 3 mL via INTRAVENOUS

## 2021-08-28 MED ORDER — POTASSIUM CHLORIDE CRYS ER 20 MEQ PO TBCR
20.0000 meq | EXTENDED_RELEASE_TABLET | Freq: Two times a day (BID) | ORAL | Status: DC
  Administered 2021-08-28 – 2021-08-30 (×5): 20 meq via ORAL
  Filled 2021-08-28 (×5): qty 1

## 2021-08-28 MED ORDER — SODIUM CHLORIDE 0.9 % IV SOLN: 250.0000 mL | INTRAVENOUS | Status: DC | PRN

## 2021-08-28 MED ORDER — ~~LOC~~ CARDIAC SURGERY, PATIENT & FAMILY EDUCATION
Freq: Once | Status: AC
  Administered 2021-08-28: 1

## 2021-08-28 MED FILL — Electrolyte-R (PH 7.4) Solution: INTRAVENOUS | Qty: 4000 | Status: AC

## 2021-08-28 MED FILL — Mannitol IV Soln 20%: INTRAVENOUS | Qty: 500 | Status: AC

## 2021-08-28 MED FILL — Sodium Bicarbonate IV Soln 8.4%: INTRAVENOUS | Qty: 50 | Status: AC

## 2021-08-28 MED FILL — Sodium Chloride IV Soln 0.9%: INTRAVENOUS | Qty: 2000 | Status: AC

## 2021-08-28 MED FILL — Lidocaine HCl(Cardiac) IV PF Soln Pref Syr 100 MG/5ML (2%): INTRAVENOUS | Qty: 5 | Status: AC

## 2021-08-28 MED FILL — Heparin Sodium (Porcine) Inj 1000 Unit/ML: INTRAMUSCULAR | Qty: 10 | Status: AC

## 2021-08-28 NOTE — Progress Notes (Signed)
2 Days Post-Op Procedure(s) (LRB): CORONARY ARTERY BYPASS GRAFTING (CABG) TIMES 3 USING LEFT INTERNAL MAMMARY ARTERY AND GREATER SAPHENOUS VEIN HARVESTED ENDOSCOPICALLY. LIMA TO LAD, SVG TO OM1, SVG TO OM2 (N/A) TRANSESOPHAGEAL ECHOCARDIOGRAM (TEE) (N/A) ENDOVEIN HARVEST OF GREATER SAPHENOUS VEIN (Right) APPLICATION OF CELL SAVER (N/A) Subjective: No complaints  Objective: Vital signs in last 24 hours: Temp:  [98 F (36.7 C)-99.7 F (37.6 C)] 98 F (36.7 C) (12/16 0600) Pulse Rate:  [63-96] 76 (12/16 0703) Cardiac Rhythm: A-V Sequential paced (12/16 0703) Resp:  [7-33] 21 (12/16 0703) BP: (100-132)/(56-70) 129/62 (12/16 0703) SpO2:  [94 %-100 %] 95 % (12/16 0703) Arterial Line BP: (97-142)/(43-60) 115/53 (12/15 1730) Weight:  [85.5 kg] 85.5 kg (12/16 0500)  Hemodynamic parameters for last 24 hours: PAP: (11-28)/(3-14) 28/12 CO:  [3.9 L/min] 3.9 L/min CI:  [2 L/min/m2] 2 L/min/m2  Intake/Output from previous day: 12/15 0701 - 12/16 0700 In: 1644.1 [P.O.:780; I.V.:664.1; IV Piggyback:200] Out: 2595 [WEXHB:7169; Chest Tube:130] Intake/Output this shift: No intake/output data recorded.  General appearance: alert and cooperative Neurologic: intact Heart: regular rate and rhythm Lungs: clear to auscultation bilaterally Extremities: edema mild Wound: dressing dry  Lab Results: Recent Labs    08/27/21 1731 08/28/21 0513  WBC 14.8* 11.6*  HGB 10.0* 9.7*  HCT 31.1* 30.8*  PLT 168 144*   BMET:  Recent Labs    08/27/21 1731 08/28/21 0345  NA 135 134*  K 3.9 4.2  CL 104 103  CO2 24 24  GLUCOSE 165* 127*  BUN 14 15  CREATININE 0.83 0.89  CALCIUM 7.8* 8.1*    PT/INR:  Recent Labs    08/26/21 1555  LABPROT 17.9*  INR 1.5*   ABG    Component Value Date/Time   PHART 7.315 (L) 08/27/2021 0211   HCO3 22.6 08/27/2021 0211   TCO2 24 08/27/2021 0211   ACIDBASEDEF 3.0 (H) 08/27/2021 0211   O2SAT 96.0 08/27/2021 0211   CBG (last 3)  Recent Labs     08/27/21 1609 08/27/21 1929 08/28/21 0623  GLUCAP 158* 146* 136*   CXR: mild left base atelectasis  Assessment/Plan: S/P Procedure(s) (LRB): CORONARY ARTERY BYPASS GRAFTING (CABG) TIMES 3 USING LEFT INTERNAL MAMMARY ARTERY AND GREATER SAPHENOUS VEIN HARVESTED ENDOSCOPICALLY. LIMA TO LAD, SVG TO OM1, SVG TO OM2 (N/A) TRANSESOPHAGEAL ECHOCARDIOGRAM (TEE) (N/A) ENDOVEIN HARVEST OF GREATER SAPHENOUS VEIN (Right) APPLICATION OF CELL SAVER (N/A)  POD 2 Hemodynamically stable in sinus rhythm, atrial sensed and V-paced by internal pacer. Continue Atenolol for rate control.  Volume excess: continue diuresis and KCL  DM: glucose under good control on Levemir and SSI.  DC foley and sleeve.  Transfer to 4E and continue IS, ambulation.   LOS: 2 days    Alleen Borne 08/28/2021

## 2021-08-29 ENCOUNTER — Inpatient Hospital Stay (HOSPITAL_COMMUNITY): Payer: Medicare Other

## 2021-08-29 LAB — BASIC METABOLIC PANEL
Anion gap: 6 (ref 5–15)
BUN: 17 mg/dL (ref 8–23)
CO2: 27 mmol/L (ref 22–32)
Calcium: 8.4 mg/dL — ABNORMAL LOW (ref 8.9–10.3)
Chloride: 105 mmol/L (ref 98–111)
Creatinine, Ser: 0.98 mg/dL (ref 0.44–1.00)
GFR, Estimated: >60 mL/min (ref >60)
Glucose, Bld: 110 mg/dL — ABNORMAL HIGH (ref 70–99)
Potassium: 4 mmol/L (ref 3.5–5.1)
Sodium: 138 mmol/L (ref 135–145)

## 2021-08-29 LAB — CBC
HCT: 28.7 % — ABNORMAL LOW (ref 36.0–46.0)
Hemoglobin: 8.9 g/dL — ABNORMAL LOW (ref 12.0–15.0)
MCH: 26.3 pg (ref 26.0–34.0)
MCHC: 31 g/dL (ref 30.0–36.0)
MCV: 84.7 fL (ref 80.0–100.0)
Platelets: 151 10*3/uL (ref 150–400)
RBC: 3.39 MIL/uL — ABNORMAL LOW (ref 3.87–5.11)
RDW: 14.4 % (ref 11.5–15.5)
WBC: 9.8 10*3/uL (ref 4.0–10.5)
nRBC: 0 % (ref 0.0–0.2)

## 2021-08-29 LAB — GLUCOSE, CAPILLARY
Glucose-Capillary: 90 mg/dL (ref 70–99)
Glucose-Capillary: 190 mg/dL — ABNORMAL HIGH (ref 70–99)
Glucose-Capillary: 112 mg/dL — ABNORMAL HIGH (ref 70–99)
Glucose-Capillary: 102 mg/dL — ABNORMAL HIGH (ref 70–99)
Glucose-Capillary: 130 mg/dL — ABNORMAL HIGH (ref 70–99)

## 2021-08-29 MED ORDER — LISINOPRIL 10 MG PO TABS
10.0000 mg | ORAL_TABLET | Freq: Every day | ORAL | Status: DC
  Administered 2021-08-29 – 2021-08-30 (×2): 10 mg via ORAL
  Filled 2021-08-29 (×2): qty 1

## 2021-08-29 NOTE — Progress Notes (Addendum)
° °   °  [address]       [place] [address]             [phone]      3 Days Post-Op Procedure(s) (LRB): CORONARY ARTERY BYPASS GRAFTING (CABG) TIMES 3 USING LEFT INTERNAL MAMMARY ARTERY AND GREATER SAPHENOUS VEIN HARVESTED ENDOSCOPICALLY. LIMA TO LAD, SVG TO OM1, SVG TO OM2 (N/A) TRANSESOPHAGEAL ECHOCARDIOGRAM (TEE) (N/A) ENDOVEIN HARVEST OF GREATER SAPHENOUS VEIN (Right) APPLICATION OF CELL SAVER (N/A)  Subjective:  Patient is doing well.  The patient does note that when she gets up and goes to the bathroom she can really feel her heart beating.  + ambulation  + BM  Objective: Vital signs in last 24 hours: Temp:  [97.7 F (36.5 C)-99.5 F (37.5 C)] 98.7 F (37.1 C) (12/17 0347) Pulse Rate:  [67-81] 81 (12/17 0347) Cardiac Rhythm: Ventricular paced (12/16 1950) Resp:  [15-20] 17 (12/17 0347) BP: (108-148)/(56-70) 148/68 (12/17 0347) SpO2:  [93 %-97 %] 97 % (12/17 0347) Weight:  [85.7 kg] 85.7 kg (12/17 0347)  Intake/Output from previous day: 12/16 0701 - 12/17 0700 In: 297 [P.O.:120; I.V.:77; IV Piggyback:100] Out: 575 [Urine:575]  General appearance: alert, cooperative, and no distress Heart: regular rate and rhythm and paced Lungs: clear to auscultation bilaterally Abdomen: soft, non-tender; bowel sounds normal; no masses,  no organomegaly Extremities: edema trace Wound: clean and dry  Lab Results: Recent Labs    08/28/21 0513 08/29/21 0121  WBC 11.6* 9.8  HGB 9.7* 8.9*  HCT 30.8* 28.7*  PLT 144* 151   BMET:  Recent Labs    08/28/21 0345 08/29/21 0121  NA 134* 138  K 4.2 4.0  CL 103 105  CO2 24 27  GLUCOSE 127* 110*  BUN 15 17  CREATININE 0.89 0.98  CALCIUM 8.1* 8.4*    PT/INR:  Recent Labs    08/26/21 1555  LABPROT 17.9*  INR 1.5*   ABG    Component Value Date/Time   PHART 7.315 (L) 08/27/2021 0211   HCO3 22.6 08/27/2021 0211   TCO2 24 08/27/2021 0211   ACIDBASEDEF 3.0 (H) 08/27/2021 0211   O2SAT 96.0 08/27/2021  0211   CBG (last 3)  Recent Labs    08/28/21 1600 08/28/21 2118 08/29/21 0600  GLUCAP 136* 112* 90    Assessment/Plan: S/P Procedure(s) (LRB): CORONARY ARTERY BYPASS GRAFTING (CABG) TIMES 3 USING LEFT INTERNAL MAMMARY ARTERY AND GREATER SAPHENOUS VEIN HARVESTED ENDOSCOPICALLY. LIMA TO LAD, SVG TO OM1, SVG TO OM2 (N/A) TRANSESOPHAGEAL ECHOCARDIOGRAM (TEE) (N/A) ENDOVEIN HARVEST OF GREATER SAPHENOUS VEIN (Right) APPLICATION OF CELL SAVER (N/A)  CV- NSR, internal PPM functioning properly- on atenolol at 25 mg, she has mild hypertension will restart home Lisinopril Pulm- wean oxygen as tolerated, CXR with some atelectasis, trace effusions Renal-creatinine stable, weight is trending down, + edema on exam.. continue lasix, potassium Dm- cbgs controlled Dispo- patient stable, restart home Lisinopril for additional BP control, continue diuretics, patient is doing well, for possible d/c in AM   LOS: 3 days    Lowella Dandy, PA-C 08/29/2021   Chart reviewed, patient examined, agree with above. We don't have a preop wt since she was an emergency. We should keep on diuretic for a few more days since she still has some edema. Plan to send home on Xarelto and ASA 81 for hx of AF and coronary disease.

## 2021-08-29 NOTE — Progress Notes (Signed)
Patient ambulated in hallway with nursing staff and rolling walker. Distance about 200 feet. Back in room sitting on edge of bed. Call bell with in reach. Xitlalic Maslin, Randall An RN

## 2021-08-29 NOTE — Progress Notes (Signed)
CARDIAC REHAB PHASE I   PRE:  Rate/Rhythm: 81 paced  BP:  Sitting: 130/64      SaO2: 96 RA  MODE:  Ambulation: 200 ft   POST:  Rate/Rhythm: 95 paced  BP:  Sitting: 134/67    SaO2: 100 RA  Pt ambulated 270ft in hallway assist of one with front wheel walker. Tried to encourage sitting in recliner, pt preferring EOB at this time. D/c education completed with pt and family. Pt educated on importance of site care and monitoring incisions daily. Encouraged continued IS use, walks, and sternal precautions. Pt given in-the-tube sheet along with heart healthy and diabetic diets. Reviewed restrictions and exercise guidelines. Will refer to CRP II [place]. Pt requesting front wheel walker, RN aware.  [id number] Reynold Bowen, RN BSN 08/29/2021 9:12 AM

## 2021-08-29 NOTE — TOC Transition Note (Signed)
Transition of Care [facility]) - CM/SW Discharge Note   Patient Details  Name: [name] [name] MRN: [id number] Date of Birth: [birth year]  [facility] CM/SW Contact:  Kallie Locks, RN Phone Number: [phone] 08/29/2021, 2:20 PM   Clinical Narrative:   Spoke with [name] who indicates she lives with family. Discussed that rolling walker was ordered and will be delivered to room before she goes home. Likely dc tomorrow. [name] denies having any other needs for DME and otherwise. Plans to follow up with cardiac rehab.  Spoke with Jasmine to request front wheel rolling walker to be delivered to patient's room.   No further needs assessed.      Final next level of care: OP Rehab (CARDIAC REHAB)    Patient Goals and CMS Choice Patient states their goals for this hospitalization and ongoing recovery are:: return home      Discharge Placement                       Discharge Plan and Services                DME Arranged: Walker rolling DME Agency: [employer] Date DME Agency Contacted: 08/29/21 Time DME Agency Contacted: 1335 Representative spoke with at DME Agency: Leavy Cella            Social Determinants of Health (SDOH) Interventions     Readmission Risk Interventions No flowsheet data found.

## 2021-08-30 LAB — GLUCOSE, CAPILLARY: Glucose-Capillary: 100 mg/dL — ABNORMAL HIGH (ref 70–99)

## 2021-08-30 MED ORDER — TRAMADOL HCL 50 MG PO TABS: 50.0000 mg | ORAL_TABLET | ORAL | 0 refills | Status: DC | PRN

## 2021-08-30 MED ORDER — EZETIMIBE 10 MG PO TABS: 10.0000 mg | ORAL_TABLET | Freq: Every day | ORAL | 3 refills | Status: AC

## 2021-08-30 MED ORDER — ASPIRIN 81 MG PO TBEC: 81.0000 mg | DELAYED_RELEASE_TABLET | Freq: Every day | ORAL | Status: AC

## 2021-08-30 MED ORDER — ATENOLOL 25 MG PO TABS: 50.0000 mg | ORAL_TABLET | Freq: Every day | ORAL | Status: DC

## 2021-08-30 MED ORDER — ACETAMINOPHEN 500 MG PO TABS: 1000.0000 mg | ORAL_TABLET | Freq: Four times a day (QID) | ORAL | 0 refills | Status: DC | PRN

## 2021-08-30 MED ORDER — ATENOLOL 25 MG PO TABS
25.0000 mg | ORAL_TABLET | Freq: Every day | ORAL | Status: DC
  Administered 2021-08-30: 10:00:00 25 mg via ORAL
  Filled 2021-08-30: qty 1

## 2021-08-30 NOTE — Progress Notes (Addendum)
° °   °  [address]       [place] [address]             [phone]      4 Days Post-Op Procedure(s) (LRB): CORONARY ARTERY BYPASS GRAFTING (CABG) TIMES 3 USING LEFT INTERNAL MAMMARY ARTERY AND GREATER SAPHENOUS VEIN HARVESTED ENDOSCOPICALLY. LIMA TO LAD, SVG TO OM1, SVG TO OM2 (N/A) TRANSESOPHAGEAL ECHOCARDIOGRAM (TEE) (N/A) ENDOVEIN HARVEST OF GREATER SAPHENOUS VEIN (Right) APPLICATION OF CELL SAVER (N/A)  Subjective:  No complaints.  Patient is feeling well and is ready to go home.  + ambulation  + BM  Objective: Vital signs in last 24 hours: Temp:  [98.1 F (36.7 C)-98.6 F (37 C)] 98.1 F (36.7 C) (12/18 0742) Pulse Rate:  [70-81] 79 (12/18 0742) Cardiac Rhythm: Ventricular paced (12/17 1931) Resp:  [14-25] 16 (12/18 0742) BP: (111-152)/(59-78) 127/62 (12/18 0742) SpO2:  [94 %-100 %] 94 % (12/18 0742) Weight:  [81.6 kg] 81.6 kg (12/18 0424)  Intake/Output from previous day:  General appearance: alert, cooperative, and no distress Heart: regular rate and rhythm and paced Lungs: clear to auscultation bilaterally Abdomen: soft, non-tender; bowel sounds normal; no masses,  no organomegaly Extremities: edema none present Wound: clean and dry  Lab Results: Recent Labs    08/28/21 0513 08/29/21 0121  WBC 11.6* 9.8  HGB 9.7* 8.9*  HCT 30.8* 28.7*  PLT 144* 151   BMET:  Recent Labs    08/28/21 0345 08/29/21 0121  NA 134* 138  K 4.2 4.0  CL 103 105  CO2 24 27  GLUCOSE 127* 110*  BUN 15 17  CREATININE 0.89 0.98  CALCIUM 8.1* 8.4*    PT/INR: No results for input(s): LABPROT, INR in the last 72 hours. ABG    Component Value Date/Time   PHART 7.315 (L) 08/27/2021 0211   HCO3 22.6 08/27/2021 0211   TCO2 24 08/27/2021 0211   ACIDBASEDEF 3.0 (H) 08/27/2021 0211   O2SAT 96.0 08/27/2021 0211   CBG (last 3)  Recent Labs    08/29/21 1617 08/29/21 2056 08/30/21 0616  GLUCAP 102* 130* 100*    Assessment/Plan: S/P Procedure(s)  (LRB): CORONARY ARTERY BYPASS GRAFTING (CABG) TIMES 3 USING LEFT INTERNAL MAMMARY ARTERY AND GREATER SAPHENOUS VEIN HARVESTED ENDOSCOPICALLY. LIMA TO LAD, SVG TO OM1, SVG TO OM2 (N/A) TRANSESOPHAGEAL ECHOCARDIOGRAM (TEE) (N/A) ENDOVEIN HARVEST OF GREATER SAPHENOUS VEIN (Right) APPLICATION OF CELL SAVER (N/A)  CV- NSR, Paced- HTN at times- continue Tenormin, Lisinopril will resume HCTZ, will resume Xarelto for H/O A. Fib Pulm- no acute issues, off oxygen, continue IS Renal- creatinine has been stable. No LE edema on exam, she has completed a 3 day course of lasix, will stop with HCTZ being resumed at discharge DM- sugars controlled, resuming Metformin Dispo- patient doing well, looks great, will d/c home today   LOS: 4 days    Lowella Dandy, PA-C 08/30/2021   Chart reviewed, patient examined, agree with above. Doing well. Plan home today.

## 2021-08-30 NOTE — Progress Notes (Signed)
Patient given discharge instructions, medication list and follow up appointments. Patient verbalized understanding. IV and tele were dcd. Will discharge home as ordered. Transported to exit via wheel chair and nursing staff. Sorcha Rotunno, Randall An RN

## 2021-08-31 MED FILL — Thrombin (Recombinant) For Soln 20000 Unit: CUTANEOUS | Qty: 1 | Status: AC

## 2021-09-02 ENCOUNTER — Telehealth: Payer: Self-pay

## 2021-09-02 NOTE — Telephone Encounter (Signed)
[employer] alert for " Atrial automatic threshold detected as > programmed amplitude or suspended. " Patient s/p CAB 08/26/21, no RA auto pacing threshold since surgery with clear RA impedance drop on trends from 690-720 Ohms to 450 Ohms. RA sensing appropriate. RV lead with minimal drop in impedance. Routing for further review of RA lead.  Spoke with [employer], [employer] rep who suggest patient be brought into device clinic to check RA threshold and turn off signal artifact monitor. Joey will be present to assist with reprogramming and optimizing device.   Successful telephone encounter to patient. Discussed need for appointment. Patient is scheduled for device clinic Friday, 12/23 at 10:00 am

## 2021-09-04 ENCOUNTER — Other Ambulatory Visit: Payer: Self-pay

## 2021-09-04 ENCOUNTER — Ambulatory Visit (INDEPENDENT_AMBULATORY_CARE_PROVIDER_SITE_OTHER): Payer: Medicare Other

## 2021-09-04 DIAGNOSIS — I441 Atrioventricular block, second degree: Secondary | ICD-10-CM | POA: Diagnosis not present

## 2021-09-04 NOTE — Patient Instructions (Signed)
Please call the device clinic if you have any questions or concerns or if the changes we made today do not make you feel better.   [phone]

## 2021-09-04 NOTE — Progress Notes (Signed)
Pacemaker check in clinic with industry assistance for follow alert. "[employer] alert for " Atrial automatic threshold detected as > programmed amplitude or suspended. " Patient s/p CAB 08/26/21, no RA auto pacing threshold since surgery with clear RA impedance drop on trends from 690-720 Ohms to 450 Ohms. RA sensing appropriate. RV lead with minimal drop in impedance. Routing for further review of RA lead". Patient has been extremely short of breath with minimal exertions since leaving hospital post CABG. Unable to complete ADLs. Thresholds, sensing, impedances consistent with previous measurements. Device programmed to maximize longevity. No high ventricular rates noted. +ATR episodes. Known PAF. + Xarelto. Device programmed at appropriate safety margins. Histogram distribution appropriate for patient activity level.  Estimated longevity 8 years. Patient enrolled in remote follow-up. Patient education completed. SEE PROGRAMMING ATTACHMENT FOR SETTING CHANGES TODAY. Post changes patient walked around office with significant improvement in shortness of breath and energy.

## 2021-09-08 ENCOUNTER — Other Ambulatory Visit: Payer: Self-pay

## 2021-09-08 ENCOUNTER — Ambulatory Visit (INDEPENDENT_AMBULATORY_CARE_PROVIDER_SITE_OTHER): Payer: Self-pay

## 2021-09-08 VITALS — Ht 65.0 in

## 2021-09-08 DIAGNOSIS — Z4802 Encounter for removal of sutures: Secondary | ICD-10-CM

## 2021-09-08 NOTE — Progress Notes (Signed)
Patient arrived for nurse visit to remove suture/staples post- procedure CABG 08/26/21 with Dr. Laneta Simmers.  3 Sutures removed with no signs/ symptoms of infection noted.  Patient tolerated procedure well.  Patient/ family instructed to keep the incision sites clean and dry.  Patient/ family acknowledged instructions given.

## 2021-09-18 NOTE — Progress Notes (Signed)
Cardiology Office Note:   Date:  09/21/2021  NAME:  [name] [name]    MRN: [id number] DOB:  1945/09/22   PCP:  Chesley Noon, MD  Cardiologist:  Evalina Field, MD  Electrophysiologist:  Cristopher Peru, MD   Referring MD: Chesley Noon, MD   Chief Complaint  Patient presents with   Coronary Artery Disease    History of Present Illness:   [name] [name] is a 76 y.o. female with a hx of CAD status post CABG, paroxysmal atrial fibrillation, complete heart block status post pacemaker implantation, diabetes who presents for follow-up.  She was seen on 08/26/2021 was direct admission to the hospital for unstable angina.  Found to have critical left main stenosis and underwent emergent bypass surgery.  She had a LIMA to LAD and vein graft to OM 1 as well as vein graft to OM2 placed.  TEE at the time of surgery showed no evidence of valvular heart disease.  She is doing well since bypass surgery.  Denies any chest pain or trouble breathing.  She is walking up to 1/4 mile per day without limitations.  Her blood pressure has been running low.  BP values barely over [id number] systolic.  We discussed stopping her HCTZ and lisinopril.  She has been started on Zetia.  Need to get her LDL cholesterol less than 50.  Denies any other major symptoms in office today.  She did not have an echocardiogram prior to surgery as her case was emergent.  We will repeat this.  She has no signs of pericardial effusion.  She will see surgery in the next 1 to 2 weeks.  She will need a chest x-ray.  Lungs are clear.  Overall seems to be doing well from her bypass surgery.  Sternotomy scar is well-healed.  Problem List CHB s/p ppm HTN DM -A1c 7.1 CAD -PCI mid LAD 2008 -Unstable Angina 08/26/2021 with severe LM dz -CABG x 3 (LIMA-LAD, SVG-Om1, SVG-Om2 08/26/2021) 5. HLD -T chol 141, HDL 51, LDL 68, TG 108 6. Paroxysmal Afib  -CHADSVASC=5 (age,female, HTN, DM, CAD)  Past Medical History: Past Medical History:   Diagnosis Date   Atrial fibrillation ([place])    AV block, 2nd degree 08/2016   Constipation    Coronary artery disease involving native coronary artery of native heart with angina pectoris ([place]) 2008   - DES PCI mLAD   Cystocele with prolapse    Cystocele with rectocele    Diabetes mellitus without complication (HCC)    type 2   GERD (gastroesophageal reflux disease)    Headache    sinus migraine occ   High cholesterol    Hypertension    Presence of drug coated stent in anterior descending branch of left coronary artery 2008   Taxus DES 2.75 x 16 mm (post-dilated to 3.0 mm) - calcified mid LAD segment.   Presence of permanent cardiac pacemaker 08/17/2016   [employer]   Wears glasses     Past Surgical History: Past Surgical History:  Procedure Laterality Date   ABDOMINAL HYSTERECTOMY  age 52   partial   APPENDECTOMY  age 27   BACK SURGERY  14 yrs ago   L 5   basal cell area removed  2019   shoulder   colonscopy  2019   polyps removed   CORONARY ANGIOPLASTY  14 yrs ago   stent x 1    CORONARY ARTERY BYPASS GRAFT N/A 08/26/2021   Procedure: CORONARY ARTERY BYPASS GRAFTING (  CABG) TIMES 3 USING LEFT INTERNAL MAMMARY ARTERY AND GREATER SAPHENOUS VEIN HARVESTED ENDOSCOPICALLY. LIMA TO LAD, SVG TO OM1, SVG TO OM2;  Surgeon: Gaye Pollack, MD;  Location: [facility];  Service: Open Heart Surgery;  Laterality: N/A;   CYSTOCELE REPAIR N/A 12/08/2020   Procedure: ANTERIOR REPAIR (CYSTOCELE);  Surgeon: Paula Compton, MD;  Location: [facility];  Service: Gynecology;  Laterality: N/A;   ENDOVEIN HARVEST OF GREATER SAPHENOUS VEIN Right 08/26/2021   Procedure: ENDOVEIN HARVEST OF GREATER SAPHENOUS VEIN;  Surgeon: Gaye Pollack, MD;  Location: [facility];  Service: Open Heart Surgery;  Laterality: Right;   EP IMPLANTABLE DEVICE N/A 08/17/2016   Procedure: Pacemaker Implant;  Surgeon: Evans Lance, MD;  Location: [facility];  Service: Cardiovascular;  Laterality:  N/A;   FOOT SURGERY Left yrs ago   INSERT / REPLACE / REMOVE PACEMAKER  08/17/2016   KNEE ARTHROSCOPY Right yrs ago   LEFT HEART CATH AND CORONARY ANGIOGRAPHY N/A 08/26/2021   Procedure: LEFT HEART CATH AND CORONARY ANGIOGRAPHY;  Surgeon: Leonie Man, MD;  Location: [facility];  Service: Cardiovascular;  Laterality: N/A;   ROTATOR CUFF REPAIR Right yrs ago   TEE WITHOUT CARDIOVERSION N/A 08/26/2021   Procedure: TRANSESOPHAGEAL ECHOCARDIOGRAM (TEE);  Surgeon: Gaye Pollack, MD;  Location: [facility];  Service: Open Heart Surgery;  Laterality: N/A;    Current Medications: Current Meds  Medication Sig   acetaminophen (TYLENOL) 500 MG tablet Take 2 tablets (1,000 mg total) by mouth every 6 (six) hours as needed. Fever, pain   aspirin EC 81 MG EC tablet Take 1 tablet (81 mg total) by mouth daily.   atenolol (TENORMIN) 50 MG tablet Take 25 mg by mouth every morning.    Cholecalciferol (VITAMIN D3) 1000 units CAPS Take 1,000 Units by mouth every morning.   docusate sodium (COLACE) 100 MG capsule Take 200 mg by mouth daily.   ezetimibe (ZETIA) 10 MG tablet Take 1 tablet (10 mg total) by mouth daily.   metFORMIN (GLUCOPHAGE-XR) 500 MG 24 hr tablet Take 500 mg by mouth in the morning and at bedtime.   nitroGLYCERIN (NITROSTAT) 0.4 MG SL tablet Place 0.4 mg as needed under the tongue for chest pain.   omeprazole (PRILOSEC) 20 MG capsule Take 20 mg by mouth daily before breakfast.   rosuvastatin (CRESTOR) 40 MG tablet Take 40 mg by mouth at bedtime.    traMADol (ULTRAM) 50 MG tablet Take 1 tablet (50 mg total) by mouth every 4 (four) hours as needed for moderate pain.   XARELTO 20 MG TABS tablet TAKE ONE TABLET (20MG  DOSE) BY MOUTH DAILY (Patient taking differently: 20 mg at bedtime.)   [DISCONTINUED] lisinopril-hydrochlorothiazide (PRINZIDE,ZESTORETIC) 10-12.5 MG tablet Take 1 tablet by mouth every morning.      Allergies:    Propoxyphene   Social History: Social History    Socioeconomic History   Marital status: Divorced    Spouse name: Not on file   Number of children: 2   Years of education: Not on file   Highest education level: Not on file  Occupational History   Occupation: retired  Tobacco Use   Smoking status: Never   Smokeless tobacco: Never  Vaping Use   Vaping Use: Never used  Substance and Sexual Activity   Alcohol use: Yes    Comment: occ wine   Drug use: No   Sexual activity: Not on file  Other Topics Concern   Not on file  Social History Narrative   Not on file   Social Determinants of Health   Financial Resource Strain: Not on file  Food Insecurity: Not on file  Transportation Needs: Not on file  Physical Activity: Not on file  Stress: Not on file  Social Connections: Not on file     Family History: The patient's family history includes Cancer - Colon in her sister; Cancer - Lung in her brother and brother; Diabetes in her mother and sister; Heart attack in her father; Heart disease in her mother and son.  ROS:   All other ROS reviewed and negative. Pertinent positives noted in the HPI.     EKGs/Labs/Other Studies Reviewed:   The following studies were personally reviewed by me today:  EKG:  EKG is ordered today.  The ekg ordered today demonstrates atrial sensed, V paced rhythm 70 bpm, and was personally reviewed by me.   Recent Labs: 08/25/2021: B Natriuretic Peptide 328.3; TSH 1.572 08/26/2021: ALT 17 08/27/2021: Magnesium 2.2 08/29/2021: BUN 17; Creatinine, Ser 0.98; Hemoglobin 8.9; Platelets 151; Potassium 4.0; Sodium 138   Recent Lipid Panel    Component Value Date/Time   CHOL 141 08/26/2021 0345   TRIG 108 08/26/2021 0345   HDL 51 08/26/2021 0345   CHOLHDL 2.8 08/26/2021 0345   VLDL 22 08/26/2021 0345   LDLCALC 68 08/26/2021 0345    Physical Exam:   VS:  BP (!) 102/57    Pulse 70    Ht 5\' 5"  (1.651 m)    Wt 175 lb 6.4 oz (79.6 kg)    SpO2 98%    BMI 29.19 kg/m    Wt Readings from Last 3  Encounters:  09/21/21 175 lb 6.4 oz (79.6 kg)  08/30/21 179 lb 14.4 oz (81.6 kg)  08/25/21 183 lb 6.4 oz (83.2 kg)    General: Well nourished, well developed, in no acute distress Head: Atraumatic, normal size  Eyes: PEERLA, EOMI  Neck: Supple, no JVD Endocrine: No thryomegaly Cardiac: Normal S1, S2; RRR; no murmurs, rubs, or gallops Lungs: Clear to auscultation bilaterally, no wheezing, rhonchi or rales  Abd: Soft, nontender, no hepatomegaly  Ext: No edema, pulses 2+ Musculoskeletal: No deformities, BUE and BLE strength normal and equal Skin: Warm and dry, no rashes   Neuro: Alert and oriented to person, place, time, and situation, CNII-XII grossly intact, no focal deficits  Psych: Normal mood and affect   ASSESSMENT:   JUPITER PIGMAN is a 76 y.o. female who presents for the following: 1. Coronary artery disease involving coronary bypass graft of native heart without angina pectoris   2. Mixed hyperlipidemia   3. Pacemaker   4. Paroxysmal atrial fibrillation (HCC)   5. Primary hypertension     PLAN:   1. Coronary artery disease involving coronary bypass graft of native heart without angina pectoris 2. Mixed hyperlipidemia -Admitted to the hospital on 08/26/2021 with unstable angina.  Found to have critical left main stenosis.  She is status post CABG x3 with LIMA to LAD, vein graft to OM1 and vein graft to OM 2. -She denies any symptoms in office.  Seems to be doing well.  No chest pain or trouble breathing. -She will continue aspirin 81 mg daily. -LDL cholesterol needs to be less than 50.  She is on Crestor 40 mg daily and Zetia 10 mg daily.  We will recheck a fasting lipid and 2 weeks. -Blood pressure has been low.  Stop lisinopril HCTZ. -She will continue her beta-blocker. -She did  not get a formal echocardiogram prior to surgery.  We will set her up for 1 in [place]. -I will see her back in 3 months.  3. Pacemaker -Doing well.  No issues.  Follows with Dr.  Lovena Le.  4. Paroxysmal atrial fibrillation (HCC) -No recurrence of A. fib.  On Xarelto 20 mg daily.  No issues.  5. Primary hypertension -Stop lisinopril HCTZ.  Continue atenolol.  Continue to check blood pressure daily.  Disposition: Return in about 3 months (around 12/20/2021).  Medication Adjustments/Labs and Tests Ordered: Current medicines are reviewed at length with the patient today.  Concerns regarding medicines are outlined above.  Orders Placed This Encounter  Procedures   Lipid panel   EKG 12-Lead   ECHOCARDIOGRAM COMPLETE   No orders of the defined types were placed in this encounter.   Patient Instructions  Medication Instructions:  STOP Lisinopril-HCTZ  (Check BP daily)   *If you need a refill on your cardiac medications before your next appointment, please call your pharmacy*   Lab Work: LIPID (2 weeks, you may go to any [facility] to have this completed)   If you have labs (blood work) drawn today and your tests are completely normal, you will receive your results only by: [place] (if you have MyChart) OR A paper copy in the mail If you have any lab test that is abnormal or we need to change your treatment, we will call you to review the results.   Testing/Procedures:  Echocardiogram - Your physician has requested that you have an echocardiogram. Echocardiography is a painless test that uses sound waves to create images of your heart. It provides your doctor with information about the size and shape of your heart and how well your hearts chambers and valves are working. This procedure takes approximately one hour. There are no restrictions for this procedure. This will be performed at [facility].    Follow-Up: At [facility], you and your health needs are our priority.  As part of our continuing mission to provide you with exceptional heart care, we have created designated Provider Care Teams.  These Care Teams include your primary Cardiologist  (physician) and Advanced Practice Providers (APPs -  Physician Assistants and Nurse Practitioners) who all work together to provide you with the care you need, when you need it.  We recommend signing up for the patient portal called "MyChart".  Sign up information is provided on this After Visit Summary.  MyChart is used to connect with patients for Virtual Visits (Telemedicine).  Patients are able to view lab/test results, encounter notes, upcoming appointments, etc.  Non-urgent messages can be sent to your provider as well.   To learn more about what you can do with MyChart, go to [email or web address].    Your next appointment:   March 29th @ 10:20 AM  The format for your next appointment:   In Person  Provider:   Evalina Field, MD         Time Spent with Patient: I have spent a total of 35 minutes with patient reviewing hospital notes, telemetry, EKGs, labs and examining the patient as well as establishing an assessment and plan that was discussed with the patient.  > 50% of time was spent in direct patient care.  Signed, Addison Naegeli. Audie Box, MD, [place]  [address], [place], [place] [address] [phone]  09/21/2021 9:57 AM

## 2021-09-21 ENCOUNTER — Other Ambulatory Visit: Payer: Self-pay

## 2021-09-21 ENCOUNTER — Ambulatory Visit: Payer: Medicare Other | Admitting: Cardiovascular Disease

## 2021-09-21 ENCOUNTER — Encounter: Payer: Self-pay | Admitting: Cardiovascular Disease

## 2021-09-21 VITALS — BP 102/57 | HR 70 | Ht 65.0 in | Wt 175.4 lb

## 2021-09-21 DIAGNOSIS — I10 Essential (primary) hypertension: Secondary | ICD-10-CM

## 2021-09-21 DIAGNOSIS — I25810 Atherosclerosis of coronary artery bypass graft(s) without angina pectoris: Secondary | ICD-10-CM

## 2021-09-21 DIAGNOSIS — I480 Paroxysmal atrial fibrillation: Secondary | ICD-10-CM

## 2021-09-21 DIAGNOSIS — Z950 Presence of cardiac pacemaker: Secondary | ICD-10-CM | POA: Diagnosis not present

## 2021-09-21 DIAGNOSIS — E782 Mixed hyperlipidemia: Secondary | ICD-10-CM | POA: Diagnosis not present

## 2021-09-21 NOTE — Patient Instructions (Signed)
Medication Instructions:  STOP Lisinopril-HCTZ  (Check BP daily)   *If you need a refill on your cardiac medications before your next appointment, please call your pharmacy*   Lab Work: LIPID (2 weeks, you may go to any [facility] to have this completed)   If you have labs (blood work) drawn today and your tests are completely normal, you will receive your results only by: [place] (if you have MyChart) OR A paper copy in the mail If you have any lab test that is abnormal or we need to change your treatment, we will call you to review the results.   Testing/Procedures:  Echocardiogram - Your physician has requested that you have an echocardiogram. Echocardiography is a painless test that uses sound waves to create images of your heart. It provides your doctor with information about the size and shape of your heart and how well your hearts chambers and valves are working. This procedure takes approximately one hour. There are no restrictions for this procedure. This will be performed at [facility].    Follow-Up: At [facility], you and your health needs are our priority.  As part of our continuing mission to provide you with exceptional heart care, we have created designated Provider Care Teams.  These Care Teams include your primary Cardiologist (physician) and Advanced Practice Providers (APPs -  Physician Assistants and Nurse Practitioners) who all work together to provide you with the care you need, when you need it.  We recommend signing up for the patient portal called "MyChart".  Sign up information is provided on this After Visit Summary.  MyChart is used to connect with patients for Virtual Visits (Telemedicine).  Patients are able to view lab/test results, encounter notes, upcoming appointments, etc.  Non-urgent messages can be sent to your provider as well.   To learn more about what you can do with MyChart, go to [email or web address].    Your next appointment:    March 29th @ 10:20 AM  The format for your next appointment:   In Person  Provider:   Evalina Field, MD

## 2021-09-24 ENCOUNTER — Ambulatory Visit: Payer: Medicare Other

## 2021-09-28 ENCOUNTER — Ambulatory Visit (INDEPENDENT_AMBULATORY_CARE_PROVIDER_SITE_OTHER): Payer: Medicare Other

## 2021-09-28 DIAGNOSIS — I441 Atrioventricular block, second degree: Secondary | ICD-10-CM

## 2021-09-29 ENCOUNTER — Other Ambulatory Visit: Payer: Self-pay | Admitting: Surgery

## 2021-09-29 DIAGNOSIS — Z951 Presence of aortocoronary bypass graft: Secondary | ICD-10-CM

## 2021-09-29 LAB — CUP PACEART REMOTE DEVICE CHECK
Battery Remaining Longevity: 108 mo
Battery Remaining Percentage: 100 %
Brady Statistic RA Percent Paced: 2 %
Brady Statistic RV Percent Paced: 100 %
Date Time Interrogation Session: 20230116020100
Implantable Lead Implant Date: 20171205
Implantable Lead Implant Date: 20171205
Implantable Lead Location: 753859
Implantable Lead Location: 753860
Implantable Lead Model: 7740
Implantable Lead Model: 7741
Implantable Lead Serial Number: 673793
Implantable Lead Serial Number: 819256
Implantable Pulse Generator Implant Date: 20171205
Lead Channel Impedance Value: 590 Ohm
Lead Channel Impedance Value: 643 Ohm
Lead Channel Pacing Threshold Amplitude: 0.8 V
Lead Channel Pacing Threshold Amplitude: 1.4 V
Lead Channel Pacing Threshold Pulse Width: 0.4 ms
Lead Channel Pacing Threshold Pulse Width: 0.4 ms
Lead Channel Setting Pacing Amplitude: 2 V
Lead Channel Setting Pacing Amplitude: 2.5 V
Lead Channel Setting Pacing Pulse Width: 0.4 ms
Lead Channel Setting Sensing Sensitivity: 2.5 mV
Pulse Gen Serial Number: 732442

## 2021-09-30 ENCOUNTER — Ambulatory Visit
Admission: RE | Admit: 2021-09-30 | Discharge: 2021-09-30 | Disposition: A | Discharge status: Home / Self Care | Payer: Medicare Other | Source: Ambulatory Visit | Attending: Surgery | Admitting: Surgery

## 2021-09-30 ENCOUNTER — Ambulatory Visit (INDEPENDENT_AMBULATORY_CARE_PROVIDER_SITE_OTHER): Payer: Self-pay | Admitting: Physician Assistant

## 2021-09-30 ENCOUNTER — Other Ambulatory Visit: Payer: Self-pay

## 2021-09-30 VITALS — BP 139/85 | HR 74 | Resp 20 | Ht 65.0 in | Wt 175.0 lb

## 2021-09-30 DIAGNOSIS — Z951 Presence of aortocoronary bypass graft: Secondary | ICD-10-CM

## 2021-09-30 NOTE — Patient Instructions (Signed)
Patient is counseled regarding the importance of long term risk factor modification as they pertain to the presence of ischemic heart disease including avoiding the use of all tobacco products, dietary modifications and medical therapy for diabetes, cholesterol and lipid management, and regular exercise.     You may return to driving an automobile as long as you are no longer requiring oral narcotic pain relievers during the daytime.  It would be wise to start driving only short distances during the daylight and gradually increase from there as you feel comfortable.   Make every effort to maintain a "heart-healthy" lifestyle with regular physical exercise and adherence to a low-fat, low-carbohydrate diet.  Continue to seek regular follow-up appointments with your primary care physician and/or cardiologist.   You may continue to gradually increase your physical activity as tolerated.  Refrain from any heavy lifting or strenuous use of your arms and shoulders until at least 8 weeks from the time of your surgery, and avoid activities that cause increased pain in your chest on the side of your surgical incision.  Otherwise you may continue to increase activities without any particular limitations.  Increase the intensity and duration of physical activity gradually.

## 2021-09-30 NOTE — Progress Notes (Signed)
° °   °  [address]       [place] [address]             [phone]    HPI: Patient returns for routine postoperative follow-up having undergone CABG x 3 on 08/26/2021. The patient's early postoperative recovery while in the hospital was as expected.  She required titration of medication for additional blood pressure control.  Since hospital discharge the patient reports she is doing very well.. She looks great.  She has no complaints other than some mild numbness along her RLE EVH site.  She is hoping she can start dancing again.  She is walking up to 1 mile per day and doesn't experience any shortness of breath.  She states she really noticed a big difference once her PPM got adjusted.  She does not feel she needs cardiac rehab.  Current Outpatient Medications  Medication Sig Dispense Refill   acetaminophen (TYLENOL) 500 MG tablet Take 2 tablets (1,000 mg total) by mouth every 6 (six) hours as needed. Fever, pain 30 tablet 0   aspirin EC 81 MG EC tablet Take 1 tablet (81 mg total) by mouth daily.     atenolol (TENORMIN) 50 MG tablet Take 25 mg by mouth every morning.      Cholecalciferol (VITAMIN D3) 1000 units CAPS Take 1,000 Units by mouth every morning.     docusate sodium (COLACE) 100 MG capsule Take 200 mg by mouth daily.     ezetimibe (ZETIA) 10 MG tablet Take 1 tablet (10 mg total) by mouth daily. 30 tablet 3   metFORMIN (GLUCOPHAGE-XR) 500 MG 24 hr tablet Take 500 mg by mouth in the morning and at bedtime.     nitroGLYCERIN (NITROSTAT) 0.4 MG SL tablet Place 0.4 mg as needed under the tongue for chest pain.     omeprazole (PRILOSEC) 20 MG capsule Take 20 mg by mouth daily before breakfast.     rosuvastatin (CRESTOR) 40 MG tablet Take 40 mg by mouth at bedtime.      traMADol (ULTRAM) 50 MG tablet Take 1 tablet (50 mg total) by mouth every 4 (four) hours as needed for moderate pain. 30 tablet 0   XARELTO 20 MG TABS tablet TAKE ONE TABLET (20MG  DOSE) BY MOUTH DAILY  (Patient taking differently: 20 mg at bedtime.) 90 tablet 1   No current facility-administered medications for this visit.    Physical Exam:  Ht 5\' 5"  (1.651 m)    BMI 29.19 kg/m   Gen: NAD  Heart:RRR  Lungs:CTA bilaterally , non distended Ext: no edema Incisions:well healed  Diagnostic Tests:  CXR: post surgical changes, PPM present, sternal wires intact, no evidence of pleural effusion  A/P:  S/P CABG x 3 performed 12/27- patient looks fantastic.HUD:JSHF Her PPM has been adjusted and she has really been able to increase her activity since that occurred.  Her BP and HR are controlled  Activity- patient given instructions she can resume dancing.  She was provided additional instructions about sternal precautions and when to increase weight and use of her arms.  She is not interested in cardiac rehabilitation and she is doing well walking a mile per day and I do not see a problem with this RLE numbness- not uncommon after EVH harvest, should improve with time RTC prn, follow up with Cardiology as scheduled   1/28, PA-C [facility] [phone]

## 2021-10-06 LAB — LIPID PANEL
Chol/HDL Ratio: 2.3 ratio (ref 0.0–4.4)
Cholesterol, Total: 134 mg/dL (ref 100–199)
HDL: 58 mg/dL (ref >39)
LDL Chol Calc (NIH): 59 mg/dL (ref 0–99)
Triglycerides: 88 mg/dL (ref 0–149)
VLDL Cholesterol Cal: 17 mg/dL (ref 5–40)

## 2021-10-07 NOTE — Progress Notes (Signed)
Remote pacemaker transmission.   

## 2021-10-13 ENCOUNTER — Other Ambulatory Visit: Payer: Self-pay

## 2021-10-13 ENCOUNTER — Encounter: Payer: Self-pay | Admitting: Internal Medicine

## 2021-10-13 ENCOUNTER — Ambulatory Visit (INDEPENDENT_AMBULATORY_CARE_PROVIDER_SITE_OTHER): Payer: Medicare Other | Admitting: Internal Medicine

## 2021-10-13 VITALS — BP 158/84 | HR 70 | Ht 65.0 in | Wt 177.0 lb

## 2021-10-13 DIAGNOSIS — I2510 Atherosclerotic heart disease of native coronary artery without angina pectoris: Secondary | ICD-10-CM | POA: Diagnosis not present

## 2021-10-13 DIAGNOSIS — Z950 Presence of cardiac pacemaker: Secondary | ICD-10-CM

## 2021-10-13 DIAGNOSIS — I441 Atrioventricular block, second degree: Secondary | ICD-10-CM | POA: Diagnosis not present

## 2021-10-13 LAB — CUP PACEART INCLINIC DEVICE CHECK
Brady Statistic RA Percent Paced: 2 %
Brady Statistic RV Percent Paced: 100 %
Date Time Interrogation Session: 20230131135506
Implantable Lead Implant Date: 20171205
Implantable Lead Implant Date: 20171205
Implantable Lead Location: 753859
Implantable Lead Location: 753860
Implantable Lead Model: 7740
Implantable Lead Model: 7741
Implantable Lead Serial Number: 673793
Implantable Lead Serial Number: 819256
Implantable Pulse Generator Implant Date: 20171205
Lead Channel Impedance Value: 627 Ohm
Lead Channel Impedance Value: 675 Ohm
Lead Channel Pacing Threshold Amplitude: 1.1 V
Lead Channel Pacing Threshold Amplitude: 1.2 V
Lead Channel Pacing Threshold Pulse Width: 0.4 ms
Lead Channel Pacing Threshold Pulse Width: 0.4 ms
Lead Channel Sensing Intrinsic Amplitude: 3.9 mV
Lead Channel Setting Pacing Amplitude: 2 V
Lead Channel Setting Pacing Amplitude: 2.5 V
Lead Channel Setting Pacing Pulse Width: 0.4 ms
Lead Channel Setting Sensing Sensitivity: 2.5 mV
Pulse Gen Serial Number: 732442

## 2021-10-13 NOTE — Patient Instructions (Signed)
Medication Instructions:  °Your physician recommends that you continue on your current medications as directed. Please refer to the Current Medication list given to you today. ° °*If you need a refill on your cardiac medications before your next appointment, please call your pharmacy* ° °If you have labs (blood work) drawn today and your tests are completely normal, you will receive your results only by: °MyChart Message (if you have MyChart) OR °A paper copy in the mail °If you have any lab test that is abnormal or we need to change your treatment, we will call you to review the results. ° °Follow-Up: °At [facility], you and your health needs are our priority.  As part of our continuing mission to provide you with exceptional heart care, we have created designated Provider Care Teams.  These Care Teams include your primary Cardiologist (physician) and Advanced Practice Providers (APPs -  Physician Assistants and Nurse Practitioners) who all work together to provide you with the care you need, when you need it. ° °We recommend signing up for the patient portal called "MyChart".  Sign up information is provided on this After Visit Summary.  MyChart is used to connect with patients for Virtual Visits (Telemedicine).  Patients are able to view lab/test results, encounter notes, upcoming appointments, etc.  Non-urgent messages can be sent to your provider as well.   °To learn more about what you can do with MyChart, go to [email or web address].   ° °Your next appointment:   °1 year(s) ° °The format for your next appointment:   °In Person ° °Provider:   °Gregg [place], MD  ° ° °Other Instructions °Thank you for choosing [facility]! ° ° ° °

## 2021-10-13 NOTE — Progress Notes (Signed)
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 She also has a history of paroxysmal atrial fibrillation. She denies chest pain or shortness of breath. She has lost 45 lbs in the interim. She feels well. I saw her several weeks ago when she presented with [place] and was found to have a critical left main stenosis. She has undergone CABG. In the interim she feels well though her incision is still sore.  Allergies  Allergen Reactions   Propoxyphene Rash and Other (See Comments)    Darvocet      Current Outpatient Medications  Medication Sig Dispense Refill   acetaminophen (TYLENOL) 500 MG tablet Take 2 tablets (1,000 mg total) by mouth every 6 (six) hours as needed. Fever, pain 30 tablet 0   aspirin EC 81 MG EC tablet Take 1 tablet (81 mg total) by mouth daily.     atenolol (TENORMIN) 50 MG tablet Take 25 mg by mouth every morning.      Cholecalciferol (VITAMIN D3) 1000 units CAPS Take 1,000 Units by mouth every morning.     docusate sodium (COLACE) 100 MG capsule Take 200 mg by mouth daily.     ezetimibe (ZETIA) 10 MG tablet Take 1 tablet (10 mg total) by mouth daily. 30 tablet 3   metFORMIN (GLUCOPHAGE-XR) 500 MG 24 hr tablet Take 500 mg by mouth in the morning and at bedtime.     nitroGLYCERIN (NITROSTAT) 0.4 MG SL tablet Place 0.4 mg as needed under the tongue for chest pain.     omeprazole (PRILOSEC) 20 MG capsule Take 20 mg by mouth daily before breakfast.     rosuvastatin (CRESTOR) 40 MG tablet Take 40 mg by mouth at bedtime.      XARELTO 20 MG TABS tablet TAKE ONE TABLET (20MG  DOSE) BY MOUTH DAILY (Patient taking differently: 20 mg at bedtime.) 90 tablet 1   No current facility-administered medications for this visit.     Past Medical History:  Diagnosis Date   Atrial fibrillation (HCC)    AV block, 2nd degree 08/2016   Constipation    Coronary artery  disease involving native coronary artery of native heart with angina pectoris (HCC) 2008   - DES PCI mLAD   Cystocele with prolapse    Cystocele with rectocele    Diabetes mellitus without complication (HCC)    type 2   GERD (gastroesophageal reflux disease)    Headache    sinus migraine occ   High cholesterol    Hypertension    Presence of drug coated stent in anterior descending branch of left coronary artery 2008   Taxus DES 2.75 x 16 mm (post-dilated to 3.0 mm) - calcified mid LAD segment.   Presence of permanent cardiac pacemaker 08/17/2016   [employer]   Wears glasses     ROS:   All systems reviewed and negative except as noted in the HPI.   Past Surgical History:  Procedure Laterality Date   ABDOMINAL HYSTERECTOMY  age 71   partial   APPENDECTOMY  age 4   BACK SURGERY  14 yrs ago   L 5   basal cell area removed  2019   shoulder   colonscopy  2019   polyps removed   CORONARY ANGIOPLASTY  14 yrs ago   stent x 1    CORONARY ARTERY BYPASS GRAFT N/A 08/26/2021   Procedure: CORONARY ARTERY BYPASS GRAFTING (CABG)  TIMES 3 USING LEFT INTERNAL MAMMARY ARTERY AND GREATER SAPHENOUS VEIN HARVESTED ENDOSCOPICALLY. LIMA TO LAD, SVG TO OM1, SVG TO OM2;  Surgeon: Alleen Borne, MD;  Location: [facility] OR;  Service: Open Heart Surgery;  Laterality: N/A;   CYSTOCELE REPAIR N/A 12/08/2020   Procedure: ANTERIOR REPAIR (CYSTOCELE);  Surgeon: Huel Cote, MD;  Location: [facility]  Service: Gynecology;  Laterality: N/A;   ENDOVEIN HARVEST OF GREATER SAPHENOUS VEIN Right 08/26/2021   Procedure: ENDOVEIN HARVEST OF GREATER SAPHENOUS VEIN;  Surgeon: Alleen Borne, MD;  Location: [facility] OR;  Service: Open Heart Surgery;  Laterality: Right;   EP IMPLANTABLE DEVICE N/A 08/17/2016   Procedure: Pacemaker Implant;  Surgeon: Marinus Maw, MD;  Location: [facility] INVASIVE CV LAB;  Service: Cardiovascular;  Laterality: N/A;   FOOT SURGERY Left yrs ago   INSERT / REPLACE / REMOVE  PACEMAKER  08/17/2016   KNEE ARTHROSCOPY Right yrs ago   LEFT HEART CATH AND CORONARY ANGIOGRAPHY N/A 08/26/2021   Procedure: LEFT HEART CATH AND CORONARY ANGIOGRAPHY;  Surgeon: Marykay Lex, MD;  Location: [facility] INVASIVE CV LAB;  Service: Cardiovascular;  Laterality: N/A;   ROTATOR CUFF REPAIR Right yrs ago   TEE WITHOUT CARDIOVERSION N/A 08/26/2021   Procedure: TRANSESOPHAGEAL ECHOCARDIOGRAM (TEE);  Surgeon: Alleen Borne, MD;  Location: [facility] OR;  Service: Open Heart Surgery;  Laterality: N/A;     Family History  Problem Relation Age of Onset   Heart disease Mother    Diabetes Mother    Heart attack Father    Cancer - Colon Sister    Diabetes Sister    Cancer - Lung Brother    Cancer - Lung Brother    Heart disease Son      Social History   Socioeconomic History   Marital status: Divorced    Spouse name: Not on file   Number of children: 2   Years of education: Not on file   Highest education level: Not on file  Occupational History   Occupation: retired  Tobacco Use   Smoking status: Never   Smokeless tobacco: Never  Vaping Use   Vaping Use: Never used  Substance and Sexual Activity   Alcohol use: Yes    Comment: occ wine   Drug use: No   Sexual activity: Not on file  Other Topics Concern   Not on file  Social History Narrative   Not on file   Social Determinants of Health   Financial Resource Strain: Not on file  Food Insecurity: Not on file  Transportation Needs: Not on file  Physical Activity: Not on file  Stress: Not on file  Social Connections: Not on file  Intimate Partner Violence: Not on file     BP (!) 158/84    Pulse 70    Ht 5\' 5"  (1.651 m)    Wt 177 lb (80.3 kg)    SpO2 97%    BMI 29.45 kg/m   Physical Exam:  Well appearing NAD HEENT: Unremarkable Neck:  No JVD, no thyromegally Lymphatics:  No adenopathy Back:  No CVA tenderness Lungs:  Clear with no wheezes HEART:  Regular rate rhythm, no murmurs, no rubs, no clicks Abd:  soft,  positive bowel sounds, no organomegally, no rebound, no guarding Ext:  2 plus pulses, no edema, no cyanosis, no clubbing Skin:  No rashes no nodules Neuro:  CN II through XII intact, motor grossly intact  DEVICE  Normal device function.  See PaceArt for details.  Assess/Plan:  1. CHB - she is asymptomatic, s/p PPM insertion. No change 2. PPM - her [employer] D[employer] PM is working normally. We will recheck in several months.  3. HTN - her bp is only minimally elevated. I asked her to lose weight and maintain a low sodium diet. 4. Obesity - I am happy for her that she has lost 45 lbs!. She will continue her current plan.  5. CAD - she is s/p CABG for critical left main disease.    Sharlot GowdaGregg Jannie Doyle,MD

## 2021-10-20 ENCOUNTER — Other Ambulatory Visit: Payer: Self-pay

## 2021-10-20 ENCOUNTER — Ambulatory Visit (HOSPITAL_COMMUNITY)
Admission: RE | Admit: 2021-10-20 | Discharge: 2021-10-20 | Disposition: A | Discharge status: Home / Self Care | Payer: Medicare Other | Source: Ambulatory Visit | Attending: Cardiovascular Disease | Admitting: Cardiovascular Disease

## 2021-10-20 DIAGNOSIS — I25810 Atherosclerosis of coronary artery bypass graft(s) without angina pectoris: Secondary | ICD-10-CM | POA: Diagnosis present

## 2021-10-20 LAB — ECHOCARDIOGRAM COMPLETE
AR max vel: 1.39 cm2
AV Area VTI: 1.52 cm2
AV Area mean vel: 1.41 cm2
AV Mean grad: 6 mmHg
AV Peak grad: 11.2 mmHg
Ao pk vel: 1.67 m/s
Area-P 1/2: 3.99 cm2
P 1/2 time: 383 msec
S' Lateral: 2.5 cm

## 2021-10-20 NOTE — Progress Notes (Signed)
*  PRELIMINARY RESULTS* Echocardiogram 2D Echocardiogram has been performed.  Stacey Drain 10/20/2021, 9:31 AM

## 2021-12-07 NOTE — Progress Notes (Signed)
?Cardiology Office Note:   ?Date:  12/09/2021  ?NAME:  [name] [name]    ?MRN: [id number] ?DOB:  October 29, 1945  ? ?PCP:  Chesley Noon, MD  ?Cardiologist:  Evalina Field, MD  ?Electrophysiologist:  Cristopher Peru, MD  ? ?Referring MD: Chesley Noon, MD  ? ?Chief Complaint  ?Patient presents with  ? Follow-up  ?   ?  ? ?History of Present Illness:   ?[name] [name] is a 76 y.o. female with a hx of CAD status post CABG, hypertension, hyperlipidemia, paroxysmal A-fib, complete heart block status post permanent pacemaker who presents for follow-up.  Blood pressure was a bit low at her last visit.  We stopped her medication.  She reports blood pressure has increased.  She is actually taking lisinopril 5 mg daily and atenolol 25 mg daily.  Blood pressure today 128/76.  This reflects values at home.  She is doing activity around town and going line dancing without any chest pain or trouble breathing.  Overall seems to be doing quite well.  Most recent LDL cholesterol is at goal.  She continues to work on her diabetes.  Most recent pacemaker interrogation seems to be within limits.  No major issues reported.  All of her risk factors are well controlled.  She is doing well since bypass surgery.  Some soreness in her chest but this seems to be improving.  Echocardiogram demonstrates ejection fraction 45%.  No symptoms of heart failure.  I do not believe this needs to be treated aggressively. ? ?Problem List ?CHB s/p ppm ?HTN ?DM ?-A1c 7.1 ?CAD ?-PCI mid LAD 2008 ?-Unstable Angina 08/26/2021 with severe LM dz ?-CABG x 3 (LIMA-LAD, SVG-Om1, SVG-Om2 08/26/2021) ?5. HLD ?-T chol 134, HDL 58, LDL 59, TG 88 ?6. Paroxysmal Afib  ?-CHADSVASC=5 (age,female, HTN, DM, CAD) ? ?Past Medical History: ?Past Medical History:  ?Diagnosis Date  ? Atrial fibrillation ([place])   ? AV block, 2nd degree 08/2016  ? Constipation   ? Coronary artery disease involving native coronary artery of native heart with angina pectoris ([place]) 2008  ? - DES  PCI mLAD  ? Cystocele with prolapse   ? Cystocele with rectocele   ? Diabetes mellitus without complication ([place])   ? type 2  ? GERD (gastroesophageal reflux disease)   ? Headache   ? sinus migraine occ  ? High cholesterol   ? Hypertension   ? Presence of drug coated stent in anterior descending branch of left coronary artery 2008  ? Taxus DES 2.75 x 16 mm (post-dilated to 3.0 mm) - calcified mid LAD segment.  ? Presence of permanent cardiac pacemaker 08/17/2016  ? [employer]  ? Wears glasses   ? ? ?Past Surgical History: ?Past Surgical History:  ?Procedure Laterality Date  ? ABDOMINAL HYSTERECTOMY  age 24  ? partial  ? APPENDECTOMY  age 39  ? BACK SURGERY  14 yrs ago  ? L 5  ? basal cell area removed  2019  ? shoulder  ? colonscopy  2019  ? polyps removed  ? CORONARY ANGIOPLASTY  14 yrs ago  ? stent x 1   ? CORONARY ARTERY BYPASS GRAFT N/A 08/26/2021  ? Procedure: CORONARY ARTERY BYPASS GRAFTING (CABG) TIMES 3 USING LEFT INTERNAL MAMMARY ARTERY AND GREATER SAPHENOUS VEIN HARVESTED ENDOSCOPICALLY. LIMA TO LAD, SVG TO OM1, SVG TO OM2;  Surgeon: Gaye Pollack, MD;  Location: [facility] OR;  Service: Open Heart Surgery;  Laterality: N/A;  ? CYSTOCELE REPAIR N/A 12/08/2020  ?  Procedure: ANTERIOR REPAIR (CYSTOCELE);  Surgeon: Paula Compton, MD;  Location: [facility];  Service: Gynecology;  Laterality: N/A;  ? ENDOVEIN HARVEST OF GREATER SAPHENOUS VEIN Right 08/26/2021  ? Procedure: ENDOVEIN HARVEST OF GREATER SAPHENOUS VEIN;  Surgeon: Gaye Pollack, MD;  Location: [facility];  Service: Open Heart Surgery;  Laterality: Right;  ? EP IMPLANTABLE DEVICE N/A 08/17/2016  ? Procedure: Pacemaker Implant;  Surgeon: Evans Lance, MD;  Location: [place] CV LAB;  Service: Cardiovascular;  Laterality: N/A;  ? FOOT SURGERY Left yrs ago  ? INSERT / REPLACE / REMOVE PACEMAKER  08/17/2016  ? KNEE ARTHROSCOPY Right yrs ago  ? LEFT HEART CATH AND CORONARY ANGIOGRAPHY N/A 08/26/2021  ? Procedure: LEFT HEART CATH AND  CORONARY ANGIOGRAPHY;  Surgeon: Leonie Man, MD;  Location: [place] CV LAB;  Service: Cardiovascular;  Laterality: N/A;  ? ROTATOR CUFF REPAIR Right yrs ago  ? TEE WITHOUT CARDIOVERSION N/A 08/26/2021  ? Procedure: TRANSESOPHAGEAL ECHOCARDIOGRAM (TEE);  Surgeon: Gaye Pollack, MD;  Location: [facility];  Service: Open Heart Surgery;  Laterality: N/A;  ? ? ?Current Medications: ?Current Meds  ?Medication Sig  ? acetaminophen (TYLENOL) 500 MG tablet Take 2 tablets (1,000 mg total) by mouth every 6 (six) hours as needed. Fever, pain  ? aspirin EC 81 MG EC tablet Take 1 tablet (81 mg total) by mouth daily.  ? atenolol (TENORMIN) 50 MG tablet Take 25 mg by mouth every morning.   ? Cholecalciferol (VITAMIN D3) 1000 units CAPS Take 1,000 Units by mouth every morning.  ? docusate sodium (COLACE) 100 MG capsule Take 200 mg by mouth daily.  ? ezetimibe (ZETIA) 10 MG tablet Take 1 tablet (10 mg total) by mouth daily.  ? lisinopril (ZESTRIL) 5 MG tablet Take 1 tablet (5 mg total) by mouth daily.  ? metFORMIN (GLUCOPHAGE-XR) 500 MG 24 hr tablet Take 500 mg by mouth in the morning and at bedtime.  ? nitroGLYCERIN (NITROSTAT) 0.4 MG SL tablet Place 0.4 mg as needed under the tongue for chest pain.  ? omeprazole (PRILOSEC) 20 MG capsule Take 20 mg by mouth daily before breakfast.  ? rosuvastatin (CRESTOR) 40 MG tablet Take 40 mg by mouth at bedtime.   ? XARELTO 20 MG TABS tablet TAKE ONE TABLET (20MG  DOSE) BY MOUTH DAILY (Patient taking differently: 20 mg at bedtime.)  ? [DISCONTINUED] lisinopril (ZESTRIL) 10 MG tablet Take 5 mg by mouth daily.  ?  ? ?Allergies:    ?Propoxyphene  ? ?Social History: ?Social History  ? ?Socioeconomic History  ? Marital status: Divorced  ?  Spouse name: Not on file  ? Number of children: 2  ? Years of education: Not on file  ? Highest education level: Not on file  ?Occupational History  ? Occupation: retired  ?Tobacco Use  ? Smoking status: Never  ? Smokeless tobacco: Never  ?Vaping Use  ?  Vaping Use: Never used  ?Substance and Sexual Activity  ? Alcohol use: Not Currently  ?  Comment: occ wine  ? Drug use: No  ? Sexual activity: Not on file  ?Other Topics Concern  ? Not on file  ?Social History Narrative  ? Not on file  ? ?Social Determinants of Health  ? ?Financial Resource Strain: Not on file  ?Food Insecurity: Not on file  ?Transportation Needs: Not on file  ?Physical Activity: Not on file  ?Stress: Not on file  ?Social Connections: Not on file  ?  ? ?Family History: ?  The patient's family history includes Cancer - Colon in her sister; Cancer - Lung in her brother and brother; Diabetes in her mother and sister; Heart attack in her father; Heart disease in her mother and son. ? ?ROS:   ?All other ROS reviewed and negative. Pertinent positives noted in the HPI.    ? ?EKGs/Labs/Other Studies Reviewed:   ?The following studies were personally reviewed by me today: ? ?TTE 10/20/2021 ? 1. Left ventricular ejection fraction, by estimation, is 45%. The left  ?ventricle has mildly decreased function. The left ventricle demonstrates  ?regional wall motion abnormalities (suggestive of LAD disease). There is  ?mild concentric left ventricular  ?hypertrophy. Left ventricular diastolic parameters are consistent with  ?Grade I diastolic dysfunction (impaired relaxation).  ? 2. Right ventricular systolic function is normal. The right ventricular  ?size is normal. There is normal pulmonary artery systolic pressure. The  ?estimated right ventricular systolic pressure is 0000000 mmHg.  ? 3. Left atrial size was moderately dilated.  ? 4. The mitral valve is grossly normal. Trivial mitral valve  ?regurgitation. The mean mitral valve gradient is 2.6 mmHg. Moderate mitral  ?annular calcification.  ? 5. The aortic valve was not well visualized. Aortic valve regurgitation  ?is mild. No aortic stenosis is present.  ? 6. The inferior vena cava is normal in size with greater than 50%  ?respiratory variability, suggesting right  atrial pressure of 3 mmHg.  ? ?Recent Labs: ?08/25/2021: B Natriuretic Peptide 328.3; TSH 1.572 ?08/26/2021: ALT 17 ?08/27/2021: Magnesium 2.2 ?08/29/2021: BUN 17; Creatinine, Ser 0.98; Hemoglobin 8.9; Platelet

## 2021-12-09 ENCOUNTER — Encounter: Payer: Self-pay | Admitting: Cardiovascular Disease

## 2021-12-09 ENCOUNTER — Other Ambulatory Visit: Payer: Self-pay

## 2021-12-09 ENCOUNTER — Ambulatory Visit: Payer: Medicare Other | Admitting: Cardiovascular Disease

## 2021-12-09 VITALS — BP 128/76 | HR 68 | Ht 65.0 in | Wt 179.0 lb

## 2021-12-09 DIAGNOSIS — I480 Paroxysmal atrial fibrillation: Secondary | ICD-10-CM

## 2021-12-09 DIAGNOSIS — E782 Mixed hyperlipidemia: Secondary | ICD-10-CM

## 2021-12-09 DIAGNOSIS — I2510 Atherosclerotic heart disease of native coronary artery without angina pectoris: Secondary | ICD-10-CM | POA: Diagnosis not present

## 2021-12-09 DIAGNOSIS — I10 Essential (primary) hypertension: Secondary | ICD-10-CM | POA: Diagnosis not present

## 2021-12-09 MED ORDER — LISINOPRIL 5 MG PO TABS: 5.0000 mg | ORAL_TABLET | Freq: Every day | ORAL | 3 refills | Status: AC

## 2021-12-09 NOTE — Patient Instructions (Signed)
Medication Instructions:  Your physician recommends that you continue on your current medications as directed. Please refer to the Current Medication list given to you today.   Labwork: None today  Testing/Procedures: None today  Follow-Up: 1 year  Any Other Special Instructions Will Be Listed Below (If Applicable).  If you need a refill on your cardiac medications before your next appointment, please call your pharmacy.  

## 2021-12-28 ENCOUNTER — Other Ambulatory Visit: Payer: Self-pay | Admitting: Physician Assistant

## 2021-12-28 ENCOUNTER — Ambulatory Visit (INDEPENDENT_AMBULATORY_CARE_PROVIDER_SITE_OTHER): Payer: Medicare Other

## 2021-12-28 DIAGNOSIS — I441 Atrioventricular block, second degree: Secondary | ICD-10-CM

## 2021-12-30 LAB — CUP PACEART REMOTE DEVICE CHECK
Battery Remaining Longevity: 108 mo
Battery Remaining Percentage: 100 %
Brady Statistic RA Percent Paced: 12 %
Brady Statistic RV Percent Paced: 100 %
Date Time Interrogation Session: 20230418135300
Implantable Lead Implant Date: 20171205
Implantable Lead Implant Date: 20171205
Implantable Lead Location: 753859
Implantable Lead Location: 753860
Implantable Lead Model: 7740
Implantable Lead Model: 7741
Implantable Lead Serial Number: 673793
Implantable Lead Serial Number: 819256
Implantable Pulse Generator Implant Date: 20171205
Lead Channel Impedance Value: 597 Ohm
Lead Channel Impedance Value: 646 Ohm
Lead Channel Pacing Threshold Amplitude: 0.9 V
Lead Channel Pacing Threshold Amplitude: 1.4 V
Lead Channel Pacing Threshold Pulse Width: 0.4 ms
Lead Channel Pacing Threshold Pulse Width: 0.4 ms
Lead Channel Setting Pacing Amplitude: 2 V
Lead Channel Setting Pacing Amplitude: 2.5 V
Lead Channel Setting Pacing Pulse Width: 0.4 ms
Lead Channel Setting Sensing Sensitivity: 2.5 mV
Pulse Gen Serial Number: 732442

## 2022-01-14 NOTE — Progress Notes (Signed)
Remote pacemaker transmission.   

## 2022-03-29 ENCOUNTER — Ambulatory Visit (INDEPENDENT_AMBULATORY_CARE_PROVIDER_SITE_OTHER): Payer: Medicare Other

## 2022-03-29 DIAGNOSIS — I441 Atrioventricular block, second degree: Secondary | ICD-10-CM | POA: Diagnosis not present

## 2022-03-31 LAB — CUP PACEART REMOTE DEVICE CHECK
Battery Remaining Longevity: 102 mo
Battery Remaining Percentage: 100 %
Brady Statistic RA Percent Paced: 21 %
Brady Statistic RV Percent Paced: 100 %
Date Time Interrogation Session: 20230717093000
Implantable Lead Implant Date: 20171205
Implantable Lead Implant Date: 20171205
Implantable Lead Location: 753859
Implantable Lead Location: 753860
Implantable Lead Model: 7740
Implantable Lead Model: 7741
Implantable Lead Serial Number: 673793
Implantable Lead Serial Number: 819256
Implantable Pulse Generator Implant Date: 20171205
Lead Channel Impedance Value: 635 Ohm
Lead Channel Impedance Value: 655 Ohm
Lead Channel Pacing Threshold Amplitude: 1.2 V
Lead Channel Pacing Threshold Pulse Width: 0.4 ms
Lead Channel Setting Pacing Amplitude: 2 V
Lead Channel Setting Pacing Amplitude: 2.5 V
Lead Channel Setting Pacing Pulse Width: 0.4 ms
Lead Channel Setting Sensing Sensitivity: 2.5 mV
Pulse Gen Serial Number: 732442

## 2022-04-26 NOTE — Progress Notes (Signed)
Remote pacemaker transmission.   

## 2022-06-28 ENCOUNTER — Ambulatory Visit (INDEPENDENT_AMBULATORY_CARE_PROVIDER_SITE_OTHER): Payer: Medicare Other

## 2022-06-28 DIAGNOSIS — I441 Atrioventricular block, second degree: Secondary | ICD-10-CM | POA: Diagnosis not present

## 2022-06-29 LAB — CUP PACEART REMOTE DEVICE CHECK
Battery Remaining Longevity: 102 mo
Battery Remaining Percentage: 100 %
Brady Statistic RA Percent Paced: 28 %
Brady Statistic RV Percent Paced: 100 %
Date Time Interrogation Session: 20231016141000
Implantable Lead Implant Date: 20171205
Implantable Lead Implant Date: 20171205
Implantable Lead Location: 753859
Implantable Lead Location: 753860
Implantable Lead Model: 7740
Implantable Lead Model: 7741
Implantable Lead Serial Number: 673793
Implantable Lead Serial Number: 819256
Implantable Pulse Generator Implant Date: 20171205
Lead Channel Impedance Value: 584 Ohm
Lead Channel Impedance Value: 646 Ohm
Lead Channel Pacing Threshold Amplitude: 1.1 V
Lead Channel Pacing Threshold Pulse Width: 0.4 ms
Lead Channel Setting Pacing Amplitude: 2 V
Lead Channel Setting Pacing Amplitude: 2.5 V
Lead Channel Setting Pacing Pulse Width: 0.4 ms
Lead Channel Setting Sensing Sensitivity: 2.5 mV
Pulse Gen Serial Number: 732442

## 2022-07-07 IMAGING — CR DG CHEST 2V
2 series · 2 of 2 positions shown · non-contrast
Comparison: 08/29/2021

CLINICAL DATA: Status post CABG

EXAM:
CHEST - 2 VIEW

[w chest pa]
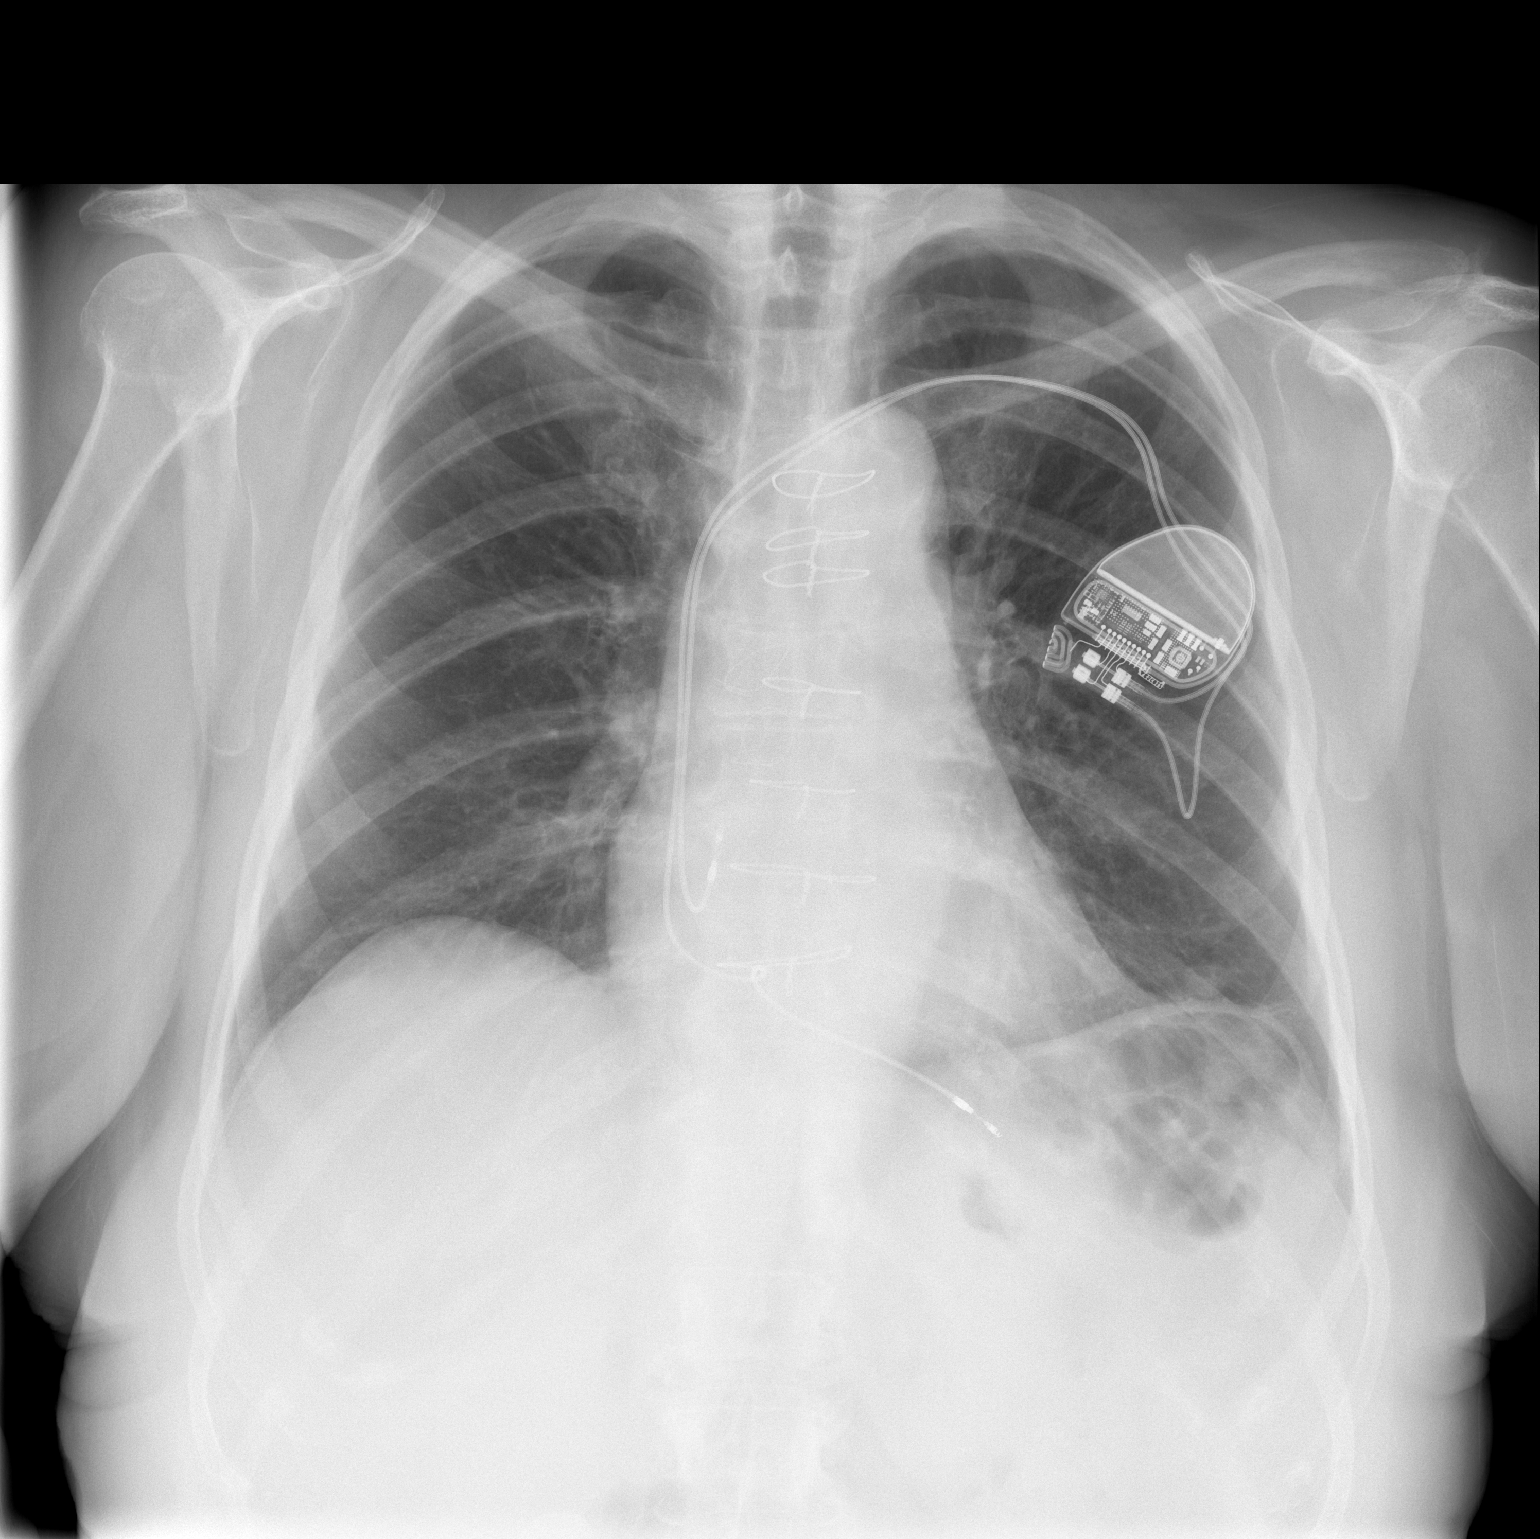

[w chest lat]
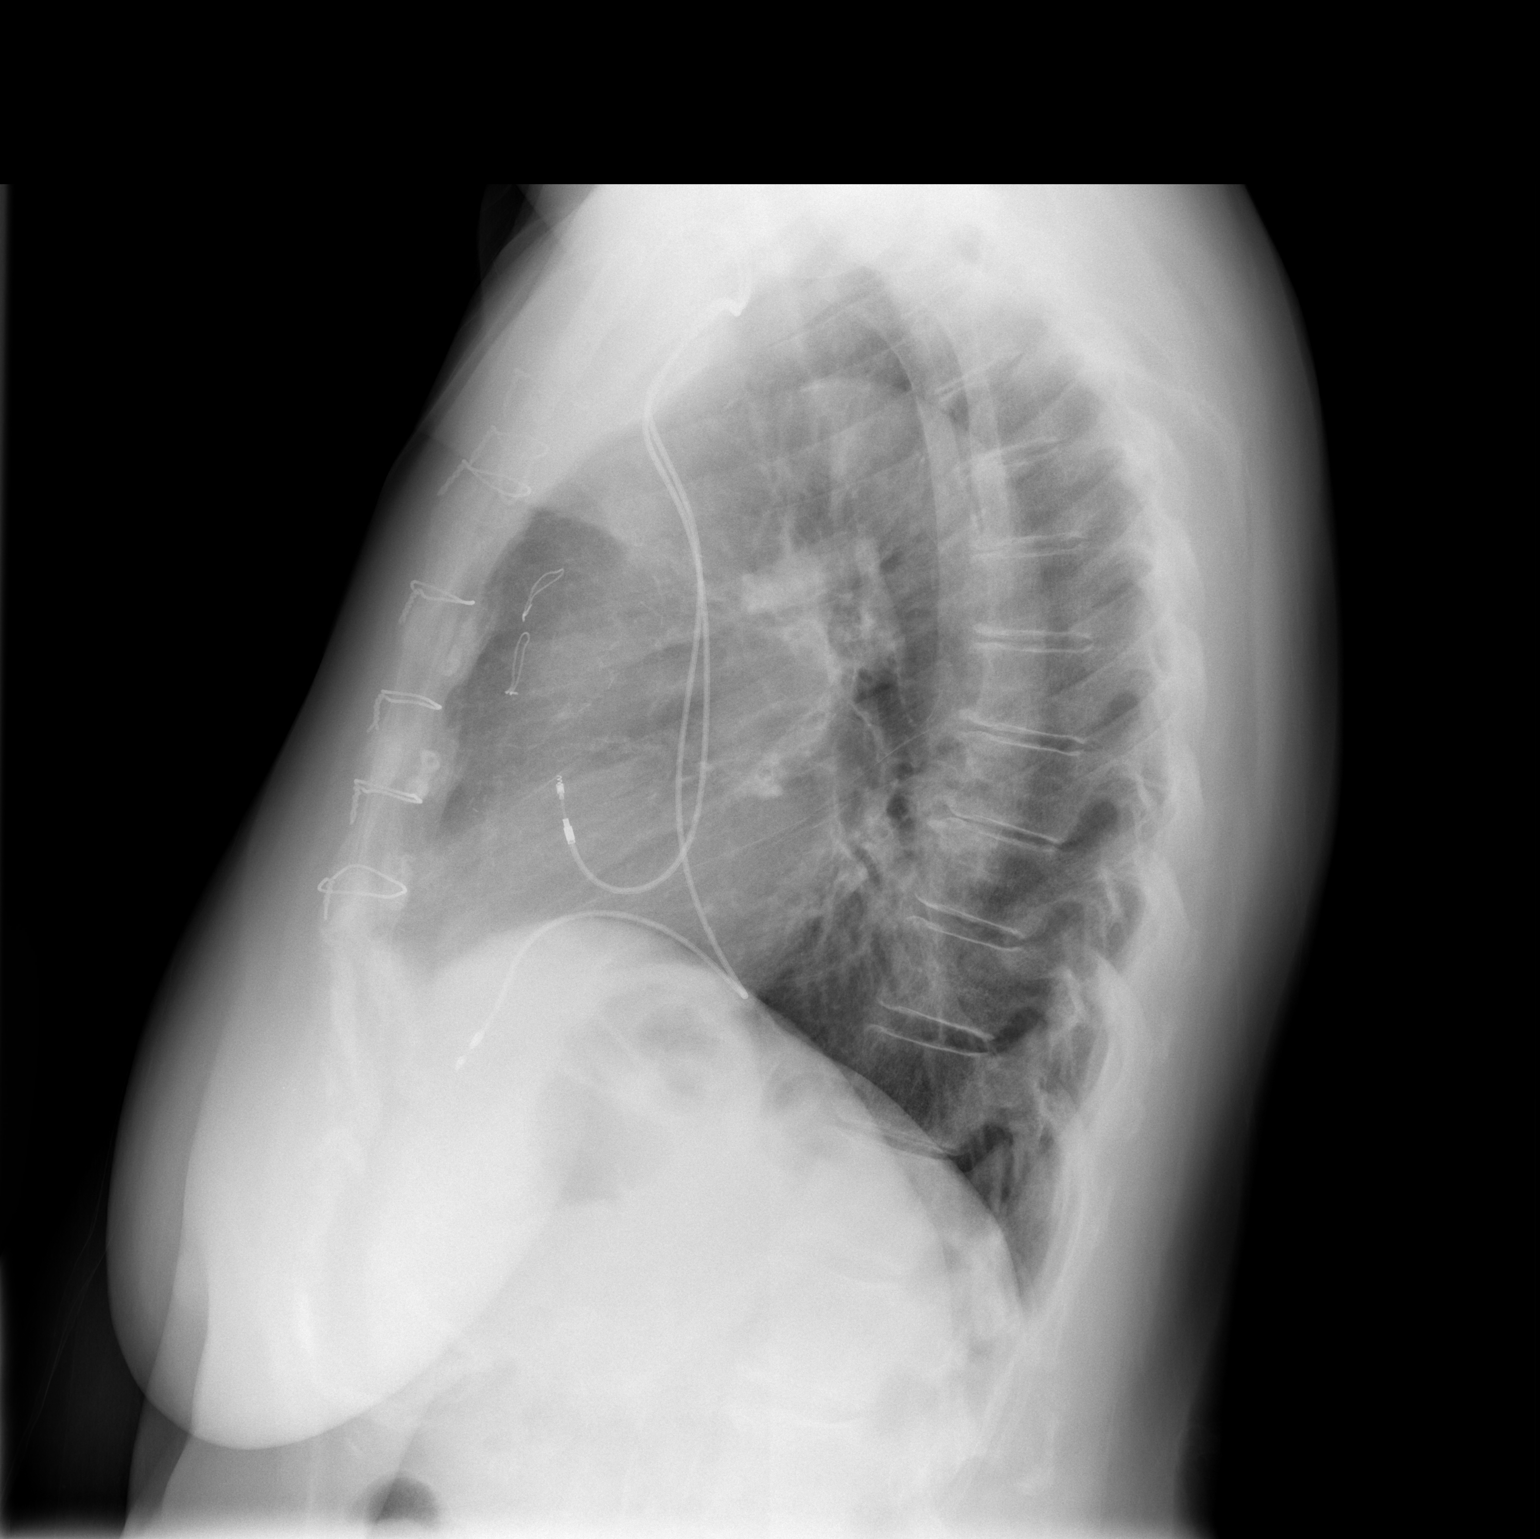

[2 of 2 positions shown; findings below may reference images not displayed]

FINDINGS: 1 cm round opacity overlying the left lung base, which isn't
definitively seen on the prior exams, although this may be due to
degree of inspiration. Left chest cardiac device and leads,
unchanged. Normal cardiac and mediastinal contours. Status post
median sternotomy and CABG. No acute osseous abnormality.
IMPRESSION: 1 cm rounded opacity overlying the left lung base, likely obscured
on prior exams. This may represent a nipple shadow. Recommend PA and
lateral with nipple markers or nonemergent chest CT.

## 2022-07-23 NOTE — Progress Notes (Signed)
Remote pacemaker transmission.   

## 2022-09-16 ENCOUNTER — Telehealth: Payer: Self-pay | Admitting: Internal Medicine

## 2022-09-16 NOTE — Telephone Encounter (Signed)
   Pre-operative Risk Assessment    Patient Name: [name] [name]  DOB: [birth year] MRN: [id number]      Request for Surgical Clearance    Procedure:   Colonoscopy  Date of Surgery:  Clearance TBD                                 Surgeon:  Not Indicated Surgeon's Group or Practice Name:  [facility] Phone number:  [phone] Fax number:  [phone]   Type of Clearance Requested:   - Pharmacy:  Hold Rivaroxaban (Xarelto)     Type of Anesthesia:  Not Indicated   Additional requests/questions:    Louretta Shorten   09/16/2022, 9:50 AM

## 2022-09-16 NOTE — Telephone Encounter (Signed)
   Name: [name] [name]  DOB: April 22, 1946  MRN: [id number]   Primary Cardiologist: Evalina Field, MD  Chart reviewed as part of pre-operative protocol coverage.  Guidance requested regarding patient holding Xarelto for upcoming procedure.  Per office protocol, patient can hold Xarelto for 1-2 days prior to procedure.   I will route this recommendation to the requesting party via Epic fax function and remove from pre-op pool. Please call with questions.  Mable Fill, Marissa Nestle, NP 09/16/2022, 12:39 PM

## 2022-09-16 NOTE — Telephone Encounter (Signed)
   Patient Name: [name] [name]  DOB: Mar 24, 1946 MRN: [id number]  Primary Cardiologist: Evalina Field, MD  Chart reviewed as part of pre-operative protocol coverage.   -Pharmacy team please advise on holding Xarelto for patient's upcoming colonoscopy procedure.  Thank you   Mable Fill, Marissa Nestle, NP 09/16/2022, 11:23 AM

## 2022-09-16 NOTE — Telephone Encounter (Signed)
Patient with diagnosis of afib on Xarelto for anticoagulation.    Procedure: colonoscopy Date of procedure: TBD  CHA2DS2-VASc Score = 6  This indicates a 9.7% annual risk of stroke. The patient's score is based upon: CHF History: 0 HTN History: 1 Diabetes History: 1 Stroke History: 0 Vascular Disease History: 1 Age Score: 2 Gender Score: 1   CrCl 36mL/min using adjusted body weight Platelet count 151K  Per office protocol, patient can hold Xarelto for 1-2 days prior to procedure.    **This guidance is not considered finalized until pre-operative APP has relayed final recommendations.**

## 2022-09-27 ENCOUNTER — Ambulatory Visit (INDEPENDENT_AMBULATORY_CARE_PROVIDER_SITE_OTHER): Payer: Medicare Other

## 2022-09-27 DIAGNOSIS — I441 Atrioventricular block, second degree: Secondary | ICD-10-CM

## 2022-09-30 LAB — CUP PACEART REMOTE DEVICE CHECK
Battery Remaining Longevity: 96 mo
Battery Remaining Percentage: 92 %
Brady Statistic RA Percent Paced: 33 %
Brady Statistic RV Percent Paced: 100 %
Date Time Interrogation Session: 20240117155200
Implantable Lead Connection Status: 753985
Implantable Lead Connection Status: 753985
Implantable Lead Implant Date: 20171205
Implantable Lead Implant Date: 20171205
Implantable Lead Location: 753859
Implantable Lead Location: 753860
Implantable Lead Model: 7740
Implantable Lead Model: 7741
Implantable Lead Serial Number: 673793
Implantable Lead Serial Number: 819256
Implantable Pulse Generator Implant Date: 20171205
Lead Channel Impedance Value: 666 Ohm
Lead Channel Impedance Value: 695 Ohm
Lead Channel Pacing Threshold Amplitude: 0.9 V
Lead Channel Pacing Threshold Amplitude: 1 V
Lead Channel Pacing Threshold Pulse Width: 0.4 ms
Lead Channel Pacing Threshold Pulse Width: 0.4 ms
Lead Channel Setting Pacing Amplitude: 2 V
Lead Channel Setting Pacing Amplitude: 2.5 V
Lead Channel Setting Pacing Pulse Width: 0.4 ms
Lead Channel Setting Sensing Sensitivity: 2.5 mV
Pulse Gen Serial Number: 732442
Zone Setting Status: 755011

## 2022-10-22 ENCOUNTER — Emergency Department (HOSPITAL_COMMUNITY)
Admission: EM | Admit: 2022-10-22 | Discharge: 2022-10-22 | Disposition: A | Discharge status: Home / Self Care | Payer: Medicare Other | Attending: Emergency Medicine | Admitting: Emergency Medicine

## 2022-10-22 ENCOUNTER — Emergency Department (HOSPITAL_COMMUNITY): Payer: Medicare Other

## 2022-10-22 DIAGNOSIS — Z7901 Long term (current) use of anticoagulants: Secondary | ICD-10-CM | POA: Insufficient documentation

## 2022-10-22 DIAGNOSIS — R109 Unspecified abdominal pain: Secondary | ICD-10-CM | POA: Diagnosis not present

## 2022-10-22 DIAGNOSIS — Z951 Presence of aortocoronary bypass graft: Secondary | ICD-10-CM | POA: Diagnosis not present

## 2022-10-22 DIAGNOSIS — D72829 Elevated white blood cell count, unspecified: Secondary | ICD-10-CM | POA: Insufficient documentation

## 2022-10-22 DIAGNOSIS — Z7984 Long term (current) use of oral hypoglycemic drugs: Secondary | ICD-10-CM | POA: Insufficient documentation

## 2022-10-22 DIAGNOSIS — Z7982 Long term (current) use of aspirin: Secondary | ICD-10-CM | POA: Diagnosis not present

## 2022-10-22 DIAGNOSIS — R350 Frequency of micturition: Secondary | ICD-10-CM | POA: Diagnosis not present

## 2022-10-22 DIAGNOSIS — I2510 Atherosclerotic heart disease of native coronary artery without angina pectoris: Secondary | ICD-10-CM | POA: Diagnosis not present

## 2022-10-22 DIAGNOSIS — I10 Essential (primary) hypertension: Secondary | ICD-10-CM | POA: Insufficient documentation

## 2022-10-22 DIAGNOSIS — N10 Acute tubulo-interstitial nephritis: Secondary | ICD-10-CM

## 2022-10-22 DIAGNOSIS — Z95810 Presence of automatic (implantable) cardiac defibrillator: Secondary | ICD-10-CM | POA: Diagnosis not present

## 2022-10-22 LAB — CBC WITH DIFFERENTIAL/PLATELET
Abs Immature Granulocytes: 0.04 10*3/uL (ref 0.00–0.07)
Basophils Absolute: 0.1 10*3/uL (ref 0.0–0.1)
Basophils Relative: 1 %
Eosinophils Absolute: 0.1 10*3/uL (ref 0.0–0.5)
Eosinophils Relative: 1 %
HCT: 37.9 % (ref 36.0–46.0)
Hemoglobin: 10.7 g/dL — ABNORMAL LOW (ref 12.0–15.0)
Immature Granulocytes: 0 %
Lymphocytes Relative: 16 %
Lymphs Abs: 1.9 10*3/uL (ref 0.7–4.0)
MCH: 20.4 pg — ABNORMAL LOW (ref 26.0–34.0)
MCHC: 28.2 g/dL — ABNORMAL LOW (ref 30.0–36.0)
MCV: 72.3 fL — ABNORMAL LOW (ref 80.0–100.0)
Monocytes Absolute: 0.7 10*3/uL (ref 0.1–1.0)
Monocytes Relative: 6 %
Neutro Abs: 9.3 10*3/uL — ABNORMAL HIGH (ref 1.7–7.7)
Neutrophils Relative %: 76 %
Platelets: 281 10*3/uL (ref 150–400)
RBC: 5.24 MIL/uL — ABNORMAL HIGH (ref 3.87–5.11)
RDW: 19.2 % — ABNORMAL HIGH (ref 11.5–15.5)
WBC: 12.2 10*3/uL — ABNORMAL HIGH (ref 4.0–10.5)
nRBC: 0 % (ref 0.0–0.2)

## 2022-10-22 LAB — URINALYSIS, ROUTINE W REFLEX MICROSCOPIC
Bacteria, UA: NONE SEEN
Bilirubin Urine: NEGATIVE
Glucose, UA: NEGATIVE mg/dL
Ketones, ur: NEGATIVE mg/dL
Nitrite: POSITIVE — AB
Protein, ur: >=300 mg/dL — AB
RBC / HPF: >50 RBC/hpf (ref 0–5)
Specific Gravity, Urine: 1.016 (ref 1.005–1.030)
WBC, UA: >50 WBC/hpf (ref 0–5)
pH: 6 (ref 5.0–8.0)

## 2022-10-22 LAB — BASIC METABOLIC PANEL
Anion gap: 11 (ref 5–15)
BUN: 17 mg/dL (ref 8–23)
CO2: 27 mmol/L (ref 22–32)
Calcium: 9.2 mg/dL (ref 8.9–10.3)
Chloride: 101 mmol/L (ref 98–111)
Creatinine, Ser: 0.93 mg/dL (ref 0.44–1.00)
GFR, Estimated: >60 mL/min (ref >60)
Glucose, Bld: 164 mg/dL — ABNORMAL HIGH (ref 70–99)
Potassium: 3.7 mmol/L (ref 3.5–5.1)
Sodium: 139 mmol/L (ref 135–145)

## 2022-10-22 MED ORDER — ONDANSETRON HCL 4 MG/2ML IJ SOLN
4.0000 mg | Freq: Once | INTRAMUSCULAR | Status: AC
  Administered 2022-10-22: 4 mg via INTRAVENOUS
  Filled 2022-10-22: qty 2

## 2022-10-22 MED ORDER — MORPHINE SULFATE (PF) 4 MG/ML IV SOLN
4.0000 mg | Freq: Once | INTRAVENOUS | Status: AC
  Administered 2022-10-22: 4 mg via INTRAVENOUS
  Filled 2022-10-22: qty 1

## 2022-10-22 MED ORDER — CEPHALEXIN 500 MG PO CAPS: 500.0000 mg | ORAL_CAPSULE | Freq: Three times a day (TID) | ORAL | 0 refills | Status: DC

## 2022-10-22 MED ORDER — SODIUM CHLORIDE 0.9 % IV BOLUS
1000.0000 mL | Freq: Once | INTRAVENOUS | Status: AC
  Administered 2022-10-22: 1000 mL via INTRAVENOUS

## 2022-10-22 MED ORDER — HYDROCODONE-ACETAMINOPHEN 5-325 MG PO TABS: 1.0000 | ORAL_TABLET | Freq: Four times a day (QID) | ORAL | 0 refills | Status: DC | PRN

## 2022-10-22 MED ORDER — SODIUM CHLORIDE 0.9 % IV SOLN
1.0000 g | Freq: Once | INTRAVENOUS | Status: AC
  Administered 2022-10-22: 1 g via INTRAVENOUS
  Filled 2022-10-22: qty 10

## 2022-10-22 NOTE — Discharge Instructions (Addendum)
Begin taking Keflex as prescribed.  Begin taking hydrocodone as prescribed as needed for pain.  Return to the emergency department if you develop pain not relieved with hydrocodone, high fevers, vomiting, worsening abdominal or back pain, or for other new and concerning symptoms.

## 2022-10-22 NOTE — ED Triage Notes (Signed)
Pt states was diagnosed with a UTI on Monday and has been taking the prescribed medication for it, however tonight is having extreme back pain with nausea and vomiting.

## 2022-10-22 NOTE — ED Provider Notes (Signed)
[facility] Provider Note   CSN: [id number] Arrival date & time: 10/22/22  [id number]     History  Chief Complaint  Patient presents with   Urinary Frequency    Recent dx of UTI; emesis/back pain    [name] [name] is a 77 y.o. female.  Patient is a 77 year old female with past medical history of coronary artery disease status post CABG, pacer defibrillator, paroxysmal A-fib, hypertension, hyperlipidemia, and prior UTIs.  Patient presenting today with complaints of right-sided flank pain.  She started with urinary frequency 4 days ago and figured she was developing a urinary tract infection.  She has antibiotics at home that she began taking, however this has not helped.  This evening, she developed pain to her right flank.  She denies any fevers or chills.  She has felt nauseated along with the pain.  She has had kidney stones in the past, but this feels different.  The history is provided by the patient.       Home Medications Prior to Admission medications   Medication Sig Start Date End Date Taking? Authorizing Provider  acetaminophen (TYLENOL) 500 MG tablet Take 2 tablets (1,000 mg total) by mouth every 6 (six) hours as needed. Fever, pain 08/30/21   Barrett, Lodema Hong, PA-C  aspirin EC 81 MG EC tablet Take 1 tablet (81 mg total) by mouth daily. 08/30/21   Barrett, Erin R, PA-C  atenolol (TENORMIN) 50 MG tablet Take 25 mg by mouth every morning.  04/29/15   [provider]  Cholecalciferol (VITAMIN D3) 1000 units CAPS Take 1,000 Units by mouth every morning.    [provider]  docusate sodium (COLACE) 100 MG capsule Take 200 mg by mouth daily.    [provider]  ezetimibe (ZETIA) 10 MG tablet Take 1 tablet (10 mg total) by mouth daily. 08/30/21   Barrett, Erin R, PA-C  lisinopril (ZESTRIL) 5 MG tablet Take 1 tablet (5 mg total) by mouth daily. 12/09/21 03/09/22  Geralynn Rile, MD  metFORMIN (GLUCOPHAGE-XR)  500 MG 24 hr tablet Take 500 mg by mouth in the morning and at bedtime. 07/12/16   [provider]  nitroGLYCERIN (NITROSTAT) 0.4 MG SL tablet Place 0.4 mg as needed under the tongue for chest pain. 04/05/17   [provider]  omeprazole (PRILOSEC) 20 MG capsule Take 20 mg by mouth daily before breakfast. 08/16/16   [provider]  rosuvastatin (CRESTOR) 40 MG tablet Take 40 mg by mouth at bedtime.  08/16/16   [provider]  XARELTO 20 MG TABS tablet TAKE ONE TABLET (20MG DOSE) BY MOUTH DAILY Patient taking differently: 20 mg at bedtime. 03/19/20   Evans Lance, MD      Allergies    Propoxyphene    Review of Systems   Review of Systems  All other systems reviewed and are negative.   Physical Exam Updated Vital Signs BP (!) 175/98 (BP Location: Right Arm)   Pulse 70   Temp 97.7 F (36.5 C) (Oral)   Resp 17   Ht 5' 5"$  (1.651 m)   Wt 80.7 kg   SpO2 100%   BMI 29.62 kg/m  Physical Exam Vitals and nursing note reviewed.  Constitutional:      General: She is not in acute distress.    Appearance: She is well-developed. She is not diaphoretic.  HENT:     Head: Normocephalic and atraumatic.  Cardiovascular:     Rate and  Rhythm: Normal rate and regular rhythm.     Heart sounds: No murmur heard.    No friction rub. No gallop.  Pulmonary:     Effort: Pulmonary effort is normal. No respiratory distress.     Breath sounds: Normal breath sounds. No wheezing.  Abdominal:     General: Bowel sounds are normal. There is no distension.     Palpations: Abdomen is soft.     Tenderness: There is no abdominal tenderness. There is right CVA tenderness. There is no left CVA tenderness.  Musculoskeletal:        General: Normal range of motion.     Cervical back: Normal range of motion and neck supple.  Skin:    General: Skin is warm and dry.  Neurological:     General: No focal deficit present.     Mental Status: She is alert and oriented to person,  place, and time.     ED Results / Procedures / Treatments   Labs (all labs ordered are listed, but only abnormal results are displayed) Labs Reviewed  BASIC METABOLIC PANEL  CBC WITH DIFFERENTIAL/PLATELET  URINALYSIS, ROUTINE W REFLEX MICROSCOPIC    EKG None  Radiology No results found.  Procedures Procedures    Medications Ordered in ED Medications  ondansetron (ZOFRAN) injection 4 mg (has no administration in time range)  morphine (PF) 4 MG/ML injection 4 mg (has no administration in time range)    ED Course/ Medical Decision Making/ A&P  Patient presenting here with complaints of right flank pain and urinary frequency.  She has history of prior UTIs and has been taking an unknown antibiotic at home.  She developed worsening flank pain this evening, then presented for evaluation.  She arrives here uncomfortable appearing, but nontoxic.  She is afebrile with stable vital signs.  Physical examination reveals right-sided CVA tenderness, but no other abnormal findings.  Workup initiated including CBC and metabolic panel.  She has a mild leukocytosis with white count of 12.2, but studies otherwise unremarkable.  Urinalysis shows positive nitrate, greater than 50 white cells, and white cell clumping.  CT renal also obtained to rule out the possibility of kidney stone.  This showed a decompressed, but inflamed bladder compatible with active UTI and suspicion of ascending UTI, but no obstructing stones.  Patient given IV Rocephin along with morphine for pain and seems to be feeling significantly improved.  Patient is nontoxic-appearing and I feel can safely be discharged.  I will prescribe Keflex and medication for pain.  She is to return as needed if her symptoms worsen or change.  Final Clinical Impression(s) / ED Diagnoses Final diagnoses:  None    Rx / DC Orders ED Discharge Orders     None         Veryl Speak, MD 10/22/22 [phone]

## 2022-10-26 ENCOUNTER — Ambulatory Visit: Payer: Medicare Other | Attending: Internal Medicine | Admitting: Internal Medicine

## 2022-10-26 ENCOUNTER — Encounter: Payer: Self-pay | Admitting: Internal Medicine

## 2022-10-26 VITALS — BP 130/76 | HR 67 | Ht 65.0 in | Wt 177.4 lb

## 2022-10-26 DIAGNOSIS — I441 Atrioventricular block, second degree: Secondary | ICD-10-CM

## 2022-10-26 DIAGNOSIS — I480 Paroxysmal atrial fibrillation: Secondary | ICD-10-CM | POA: Diagnosis not present

## 2022-10-26 LAB — CUP PACEART INCLINIC DEVICE CHECK
Date Time Interrogation Session: 20240213112115
Implantable Lead Connection Status: 753985
Implantable Lead Connection Status: 753985
Implantable Lead Implant Date: 20171205
Implantable Lead Implant Date: 20171205
Implantable Lead Location: 753859
Implantable Lead Location: 753860
Implantable Lead Model: 7740
Implantable Lead Model: 7741
Implantable Lead Serial Number: 673793
Implantable Lead Serial Number: 819256
Implantable Pulse Generator Implant Date: 20171205
Lead Channel Impedance Value: 687 Ohm
Lead Channel Impedance Value: 771 Ohm
Lead Channel Pacing Threshold Amplitude: 0.6 V
Lead Channel Pacing Threshold Amplitude: 0.8 V
Lead Channel Pacing Threshold Amplitude: 1 V
Lead Channel Pacing Threshold Pulse Width: 0.4 ms
Lead Channel Pacing Threshold Pulse Width: 0.4 ms
Lead Channel Pacing Threshold Pulse Width: 0.4 ms
Lead Channel Sensing Intrinsic Amplitude: 3.2 mV
Lead Channel Setting Pacing Amplitude: 2 V
Lead Channel Setting Pacing Amplitude: 2.5 V
Lead Channel Setting Pacing Pulse Width: 0.4 ms
Lead Channel Setting Sensing Sensitivity: 2.5 mV
Pulse Gen Serial Number: 732442
Zone Setting Status: 755011

## 2022-10-26 NOTE — Patient Instructions (Signed)
Medication Instructions:  Your physician recommends that you continue on your current medications as directed. Please refer to the Current Medication list given to you today.  *If you need a refill on your cardiac medications before your next appointment, please call your pharmacy*   Lab Work: NONE   If you have labs (blood work) drawn today and your tests are completely normal, you will receive your results only by: [facility] (if you have MyChart) OR A paper copy in the mail If you have any lab test that is abnormal or we need to change your treatment, we will call you to review the results.   Testing/Procedures: NONE    Follow-Up: At [facility], you and your health needs are our priority.  As part of our continuing mission to provide you with exceptional heart care, we have created designated Provider Care Teams.  These Care Teams include your primary Cardiologist (physician) and Advanced Practice Providers (APPs -  Physician Assistants and Nurse Practitioners) who all work together to provide you with the care you need, when you need it.  We recommend signing up for the patient portal called "MyChart".  Sign up information is provided on this After Visit Summary.  MyChart is used to connect with patients for Virtual Visits (Telemedicine).  Patients are able to view lab/test results, encounter notes, upcoming appointments, etc.  Non-urgent messages can be sent to your provider as well.   To learn more about what you can do with MyChart, go to [email or web address].    Your next appointment:   1 year(s)  Provider:   Cristopher Peru, MD    Other Instructions Thank you for choosing [facility]!

## 2022-10-26 NOTE — Progress Notes (Signed)
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 She also has a history of paroxysmal atrial fibrillation. She denies chest pain or shortness of breath. She feels well. She has undergone CABG over a year ago. In the interim she has done well. No chest pain or sob.  Allergies  Allergen Reactions   Propoxyphene Rash and Other (See Comments)    Darvocet      Current Outpatient Medications  Medication Sig Dispense Refill   acetaminophen (TYLENOL) 500 MG tablet Take 2 tablets (1,000 mg total) by mouth every 6 (six) hours as needed. Fever, pain 30 tablet 0   aspirin EC 81 MG EC tablet Take 1 tablet (81 mg total) by mouth daily.     atenolol (TENORMIN) 50 MG tablet Take 25 mg by mouth every morning.      cephALEXin (KEFLEX) 500 MG capsule Take 1 capsule (500 mg total) by mouth 3 (three) times daily. 21 capsule 0   Cholecalciferol (VITAMIN D3) 1000 units CAPS Take 1,000 Units by mouth every morning.     docusate sodium (COLACE) 100 MG capsule Take 200 mg by mouth daily.     ezetimibe (ZETIA) 10 MG tablet Take 1 tablet (10 mg total) by mouth daily. 30 tablet 3   HYDROcodone-acetaminophen (NORCO) 5-325 MG tablet Take 1-2 tablets by mouth every 6 (six) hours as needed. 12 tablet 0   lisinopril (ZESTRIL) 5 MG tablet Take 1 tablet (5 mg total) by mouth daily. 90 tablet 3   metFORMIN (GLUCOPHAGE-XR) 500 MG 24 hr tablet Take 500 mg by mouth in the morning and at bedtime.     nitroGLYCERIN (NITROSTAT) 0.4 MG SL tablet Place 0.4 mg as needed under the tongue for chest pain.     omeprazole (PRILOSEC) 20 MG capsule Take 20 mg by mouth daily before breakfast.     rosuvastatin (CRESTOR) 40 MG tablet Take 40 mg by mouth at bedtime.      XARELTO 20 MG TABS tablet TAKE ONE TABLET (20MG DOSE) BY MOUTH DAILY (Patient taking differently: 20 mg at bedtime.) 90 tablet 1   No current  facility-administered medications for this visit.     Past Medical History:  Diagnosis Date   Atrial fibrillation ([place])    AV block, 2nd degree 08/2016   Constipation    Coronary artery disease involving native coronary artery of native heart with angina pectoris ([place]) 2008   - DES PCI mLAD   Cystocele with prolapse    Cystocele with rectocele    Diabetes mellitus without complication (HCC)    type 2   GERD (gastroesophageal reflux disease)    Headache    sinus migraine occ   High cholesterol    Hypertension    Presence of drug coated stent in anterior descending branch of left coronary artery 2008   Taxus DES 2.75 x 16 mm (post-dilated to 3.0 mm) - calcified mid LAD segment.   Presence of permanent cardiac pacemaker 08/17/2016   [employer]   Wears glasses     ROS:   All systems reviewed and negative except as noted in the HPI.   Past Surgical History:  Procedure Laterality Date   ABDOMINAL HYSTERECTOMY  age 77   partial   APPENDECTOMY  age 26   BACK SURGERY  14 yrs ago   L 5   basal cell area removed  2019   shoulder  colonscopy  2019   polyps removed   CORONARY ANGIOPLASTY  14 yrs ago   stent x 1    CORONARY ARTERY BYPASS GRAFT N/A 08/26/2021   Procedure: CORONARY ARTERY BYPASS GRAFTING (CABG) TIMES 3 USING LEFT INTERNAL MAMMARY ARTERY AND GREATER SAPHENOUS VEIN HARVESTED ENDOSCOPICALLY. LIMA TO LAD, SVG TO OM1, SVG TO OM2;  Surgeon: Gaye Pollack, MD;  Location: [facility];  Service: Open Heart Surgery;  Laterality: N/A;   CYSTOCELE REPAIR N/A 12/08/2020   Procedure: ANTERIOR REPAIR (CYSTOCELE);  Surgeon: Paula Compton, MD;  Location: [facility];  Service: Gynecology;  Laterality: N/A;   ENDOVEIN HARVEST OF GREATER SAPHENOUS VEIN Right 08/26/2021   Procedure: ENDOVEIN HARVEST OF GREATER SAPHENOUS VEIN;  Surgeon: Gaye Pollack, MD;  Location: [facility];  Service: Open Heart Surgery;  Laterality: Right;   EP IMPLANTABLE DEVICE N/A 08/17/2016    Procedure: Pacemaker Implant;  Surgeon: Evans Lance, MD;  Location: [facility];  Service: Cardiovascular;  Laterality: N/A;   FOOT SURGERY Left yrs ago   INSERT / REPLACE / REMOVE PACEMAKER  08/17/2016   KNEE ARTHROSCOPY Right yrs ago   LEFT HEART CATH AND CORONARY ANGIOGRAPHY N/A 08/26/2021   Procedure: LEFT HEART CATH AND CORONARY ANGIOGRAPHY;  Surgeon: Leonie Man, MD;  Location: [facility] [facility];  Service: Cardiovascular;  Laterality: N/A;   ROTATOR CUFF REPAIR Right yrs ago   TEE WITHOUT CARDIOVERSION N/A 08/26/2021   Procedure: TRANSESOPHAGEAL ECHOCARDIOGRAM (TEE);  Surgeon: Gaye Pollack, MD;  Location: [facility];  Service: Open Heart Surgery;  Laterality: N/A;     Family History  Problem Relation Age of Onset   Heart disease Mother    Diabetes Mother    Heart attack Father    Cancer - Colon Sister    Diabetes Sister    Cancer - Lung Brother    Cancer - Lung Brother    Heart disease Son      Social History   Socioeconomic History   Marital status: Divorced    Spouse name: Not on file   Number of children: 2   Years of education: Not on file   Highest education level: Not on file  Occupational History   Occupation: retired  Tobacco Use   Smoking status: Never   Smokeless tobacco: Never  Vaping Use   Vaping Use: Never used  Substance and Sexual Activity   Alcohol use: Not Currently    Comment: occ wine   Drug use: No   Sexual activity: Not on file  Other Topics Concern   Not on file  Social History Narrative   Not on file   Social Determinants of Health   Financial Resource Strain: Not on file  Food Insecurity: Not on file  Transportation Needs: Not on file  Physical Activity: Not on file  Stress: Not on file  Social Connections: Not on file  Intimate Partner Violence: Not on file     BP 130/76   Pulse 67   Ht 5' 5"$  (1.651 m)   Wt 177 lb 6.4 oz (80.5 kg)   SpO2 98%   BMI 29.52 kg/m   Physical Exam:  Well appearing  NAD HEENT: Unremarkable Neck:  No JVD, no thyromegally Lymphatics:  No adenopathy Back:  No CVA tenderness Lungs:  Clear with no wheezes HEART:  Regular rate rhythm, no murmurs, no rubs, no clicks Abd:  soft, positive bowel sounds, no organomegally, no rebound, no guarding Ext:  2 plus  pulses, no edema, no cyanosis, no clubbing Skin:  No rashes no nodules Neuro:  CN II through XII intact, motor grossly intact  EKG- NSR with AV pacing  DEVICE  Normal device function.  See PaceArt for details.   Assess/Plan:   1.CHB - she is asymptomatic, s/p PPM insertion. No change 2. PPM - her [employer] DDD PM is working normally. We will recheck in several months.  3. HTN - her bp is only minimally elevated. I asked her to lose weight and maintain a low sodium diet. 4. Obesity - her weight is stable. 5. CAD - she is s/p CABG for critical left main disease. No anginal symptoms.    [name] Overlie Hien Cunliffe,MD

## 2022-11-17 NOTE — Progress Notes (Signed)
Remote pacemaker transmission.   

## 2022-12-17 NOTE — Progress Notes (Signed)
Cardiology Office Note:   Date:  12/20/2022  NAME:  [name] [name]    MRN: [id number] DOB:  02-03-1946   PCP:  Eartha Inch, MD  Cardiologist:  Reatha Harps, MD  Electrophysiologist:  Lewayne Bunting, MD   Referring MD: Eartha Inch, MD   Chief Complaint  Patient presents with   Follow-up    1 year.   History of Present Illness:   [name] [name] is a 77 y.o. female with a hx of CAD status post CABG, complete heart block status post pacemaker, diabetes, hyperlipidemia who presents for follow-up.  She reports she is doing well.  Denies any chest pains or trouble breathing.  Working in her yard.  No issues.  LDL cholesterol last year well-controlled.  Diabetes at goal.  She is awaiting labs from her primary care physician this year.  Overall doing quite well.  Without cardiac complaints.  Blood pressure at goal.   Problem List CHB s/p ppm HTN DM -A1c 7.1 CAD -PCI mid LAD 2008 -Unstable Angina 08/26/2021 with severe LM dz -CABG x 3 (LIMA-LAD, SVG-Om1, SVG-Om2 08/26/2021) 5. HLD -T chol 134, HDL 58, LDL 59, TG 88 6. Paroxysmal Afib  -CHADSVASC=5 (age,female, HTN, DM, CAD)  Past Medical History: Past Medical History:  Diagnosis Date   Atrial fibrillation    AV block, 2nd degree 08/2016   Constipation    Coronary artery disease involving native coronary artery of native heart with angina pectoris 2008   - DES PCI mLAD   Cystocele with prolapse    Cystocele with rectocele    Diabetes mellitus without complication    type 2   GERD (gastroesophageal reflux disease)    Headache    sinus migraine occ   High cholesterol    Hypertension    Presence of drug coated stent in anterior descending branch of left coronary artery 2008   Taxus DES 2.75 x 16 mm (post-dilated to 3.0 mm) - calcified mid LAD segment.   Presence of permanent cardiac pacemaker 08/17/2016   [employer]   Wears glasses     Past Surgical History: Past Surgical History:  Procedure  Laterality Date   ABDOMINAL HYSTERECTOMY  age 10   partial   APPENDECTOMY  age 61   BACK SURGERY  14 yrs ago   L 5   basal cell area removed  2019   shoulder   colonscopy  2019   polyps removed   CORONARY ANGIOPLASTY  14 yrs ago   stent x 1    CORONARY ARTERY BYPASS GRAFT N/A 08/26/2021   Procedure: CORONARY ARTERY BYPASS GRAFTING (CABG) TIMES 3 USING LEFT INTERNAL MAMMARY ARTERY AND GREATER SAPHENOUS VEIN HARVESTED ENDOSCOPICALLY. LIMA TO LAD, SVG TO OM1, SVG TO OM2;  Surgeon: Alleen Borne, MD;  Location: [facility] OR;  Service: Open Heart Surgery;  Laterality: N/A;   CYSTOCELE REPAIR N/A 12/08/2020   Procedure: ANTERIOR REPAIR (CYSTOCELE);  Surgeon: Huel Cote, MD;  Location: [facility];  Service: Gynecology;  Laterality: N/A;   ENDOVEIN HARVEST OF GREATER SAPHENOUS VEIN Right 08/26/2021   Procedure: ENDOVEIN HARVEST OF GREATER SAPHENOUS VEIN;  Surgeon: Alleen Borne, MD;  Location: [facility] OR;  Service: Open Heart Surgery;  Laterality: Right;   EP IMPLANTABLE DEVICE N/A 08/17/2016   Procedure: Pacemaker Implant;  Surgeon: Marinus Maw, MD;  Location: [facility] INVASIVE CV LAB;  Service: Cardiovascular;  Laterality: N/A;   FOOT SURGERY Left yrs ago   INSERT / REPLACE / REMOVE  PACEMAKER  08/17/2016   KNEE ARTHROSCOPY Right yrs ago   LEFT HEART CATH AND CORONARY ANGIOGRAPHY N/A 08/26/2021   Procedure: LEFT HEART CATH AND CORONARY ANGIOGRAPHY;  Surgeon: Marykay Lex, MD;  Location: [facility] INVASIVE CV LAB;  Service: Cardiovascular;  Laterality: N/A;   ROTATOR CUFF REPAIR Right yrs ago   TEE WITHOUT CARDIOVERSION N/A 08/26/2021   Procedure: TRANSESOPHAGEAL ECHOCARDIOGRAM (TEE);  Surgeon: Alleen Borne, MD;  Location: [facility] OR;  Service: Open Heart Surgery;  Laterality: N/A;    Current Medications: Current Meds  Medication Sig   acetaminophen (TYLENOL) 500 MG tablet Take 2 tablets (1,000 mg total) by mouth every 6 (six) hours as needed. Fever, pain   aspirin EC 81 MG EC tablet  Take 1 tablet (81 mg total) by mouth daily.   atenolol (TENORMIN) 50 MG tablet Take 25 mg by mouth every morning.    cephALEXin (KEFLEX) 500 MG capsule Take 1 capsule (500 mg total) by mouth 3 (three) times daily.   Cholecalciferol (VITAMIN D3) 1000 units CAPS Take 1,000 Units by mouth every morning.   docusate sodium (COLACE) 100 MG capsule Take 200 mg by mouth daily.   ezetimibe (ZETIA) 10 MG tablet Take 1 tablet (10 mg total) by mouth daily.   HYDROcodone-acetaminophen (NORCO) 5-325 MG tablet Take 1-2 tablets by mouth every 6 (six) hours as needed.   metFORMIN (GLUCOPHAGE-XR) 500 MG 24 hr tablet Take 500 mg by mouth in the morning and at bedtime.   nitroGLYCERIN (NITROSTAT) 0.4 MG SL tablet Place 0.4 mg as needed under the tongue for chest pain.   omeprazole (PRILOSEC) 20 MG capsule Take 20 mg by mouth daily before breakfast.   rosuvastatin (CRESTOR) 40 MG tablet Take 40 mg by mouth at bedtime.    XARELTO 20 MG TABS tablet TAKE ONE TABLET (20MG  DOSE) BY MOUTH DAILY (Patient taking differently: 20 mg at bedtime.)     Allergies:    Propoxyphene   Social History: Social History   Socioeconomic History   Marital status: Divorced    Spouse name: Not on file   Number of children: 2   Years of education: Not on file   Highest education level: Not on file  Occupational History   Occupation: retired  Tobacco Use   Smoking status: Never   Smokeless tobacco: Never  Vaping Use   Vaping Use: Never used  Substance and Sexual Activity   Alcohol use: Not Currently    Comment: occ wine   Drug use: No   Sexual activity: Not on file  Other Topics Concern   Not on file  Social History Narrative   Not on file   Social Determinants of Health   Financial Resource Strain: Not on file  Food Insecurity: Not on file  Transportation Needs: Not on file  Physical Activity: Not on file  Stress: Not on file  Social Connections: Not on file     Family History: The patient's family history  includes Cancer - Colon in her sister; Cancer - Lung in her brother and brother; Diabetes in her mother and sister; Heart attack in her father; Heart disease in her mother and son.  ROS:   All other ROS reviewed and negative. Pertinent positives noted in the HPI.     EKGs/Labs/Other Studies Reviewed:   The following studies were personally reviewed by me today:   Recent Labs: 10/22/2022: BUN 17; Creatinine, Ser 0.93; Hemoglobin 10.7; Platelets 281; Potassium 3.7; Sodium 139   Recent Lipid Panel  Component Value Date/Time   CHOL 134 10/05/2021 0841   TRIG 88 10/05/2021 0841   HDL 58 10/05/2021 0841   CHOLHDL 2.3 10/05/2021 0841   CHOLHDL 2.8 08/26/2021 0345   VLDL 22 08/26/2021 0345   LDLCALC 59 10/05/2021 0841    Physical Exam:   VS:  BP 130/70 (BP Location: Left Arm, Patient Position: Sitting, Cuff Size: Normal)   Pulse 62   Ht 5\' 5"  (1.651 m)   Wt 179 lb (81.2 kg)   BMI 29.79 kg/m    Wt Readings from Last 3 Encounters:  12/20/22 179 lb (81.2 kg)  10/26/22 177 lb 6.4 oz (80.5 kg)  10/22/22 178 lb (80.7 kg)    General: Well nourished, well developed, in no acute distress Head: Atraumatic, normal size  Eyes: PEERLA, EOMI  Neck: Supple, no JVD Endocrine: No thryomegaly Cardiac: Normal S1, S2; RRR; no murmurs, rubs, or gallops Lungs: Clear to auscultation bilaterally, no wheezing, rhonchi or rales  Abd: Soft, nontender, no hepatomegaly  Ext: No edema, pulses 2+ Musculoskeletal: No deformities, BUE and BLE strength normal and equal Skin: Warm and dry, no rashes   Neuro: Alert and oriented to person, place, time, and situation, CNII-XII grossly intact, no focal deficits  Psych: Normal mood and affect   ASSESSMENT:   [name] [name] is a 77 y.o. female who presents for the following: 1. Coronary artery disease involving coronary bypass graft of native heart without angina pectoris   2. Mixed hyperlipidemia   3. Paroxysmal atrial fibrillation   4. Cardiac pacemaker in  situ     PLAN:   1. Coronary artery disease involving coronary bypass graft of native heart without angina pectoris 2. Mixed hyperlipidemia -CAD status post CABG.  Continue aspirin 81 mg daily.  On Crestor.  Lipids at goal.  She is diabetic.  She is doing well.  No symptoms of angina.  LV function was normal.  She will see us yearly.  3. Paroxysmal atrial fibrillation -No recurrence.  On Xarelto.  4. Cardiac pacemaker in situ -In place.  Followed by Dr. Ladona Ridgelaylor.      Disposition: Return in about 1 year (around 12/20/2023).  Medication Adjustments/Labs and Tests Ordered: Current medicines are reviewed at length with the patient today.  Concerns regarding medicines are outlined above.  No orders of the defined types were placed in this encounter.  No orders of the defined types were placed in this encounter.   Patient Instructions  Medication Instructions:  The current medical regimen is effective;  continue present plan and medications.  *If you need a refill on your cardiac medications before your next appointment, please call your pharmacy*   Follow-Up: At [facility], you and your health needs are our priority.  As part of our continuing mission to provide you with exceptional heart care, we have created designated Provider Care Teams.  These Care Teams include your primary Cardiologist (physician) and Advanced Practice Providers (APPs -  Physician Assistants and Nurse Practitioners) who all work together to provide you with the care you need, when you need it.  We recommend signing up for the patient portal called "MyChart".  Sign up information is provided on this After Visit Summary.  MyChart is used to connect with patients for Virtual Visits (Telemedicine).  Patients are able to view lab/test results, encounter notes, upcoming appointments, etc.  Non-urgent messages can be sent to your provider as well.   To learn more about what you can do with MyChart, go  to  [email or web address].    Your next appointment:   12 month(s)  Provider:   Marjie Skiffallie Goodrich, PA-C, Azalee CourseHao Meng, PA-C, or Bernadene PersonEmily Monge, NP      Time Spent with Patient: I have spent a total of 25 minutes with patient reviewing hospital notes, telemetry, EKGs, labs and examining the patient as well as establishing an assessment and plan that was discussed with the patient.  > 50% of time was spent in direct patient care.  Signed, Lenna GilfordWesley T. Flora Lipps'Neal, MD, [facility] [place]  [facility]  [address] [place], [place] [address] [phone]  12/20/2022 9:49 AM

## 2022-12-20 ENCOUNTER — Encounter: Payer: Self-pay | Admitting: Cardiovascular Disease

## 2022-12-20 ENCOUNTER — Ambulatory Visit: Payer: Medicare Other | Attending: Cardiovascular Disease | Admitting: Cardiovascular Disease

## 2022-12-20 VITALS — BP 130/70 | HR 62 | Ht 65.0 in | Wt 179.0 lb

## 2022-12-20 DIAGNOSIS — Z950 Presence of cardiac pacemaker: Secondary | ICD-10-CM | POA: Diagnosis not present

## 2022-12-20 DIAGNOSIS — I25810 Atherosclerosis of coronary artery bypass graft(s) without angina pectoris: Secondary | ICD-10-CM

## 2022-12-20 DIAGNOSIS — I480 Paroxysmal atrial fibrillation: Secondary | ICD-10-CM | POA: Diagnosis not present

## 2022-12-20 DIAGNOSIS — E782 Mixed hyperlipidemia: Secondary | ICD-10-CM | POA: Diagnosis not present

## 2022-12-20 NOTE — Patient Instructions (Signed)
Medication Instructions:  The current medical regimen is effective;  continue present plan and medications.  *If you need a refill on your cardiac medications before your next appointment, please call your pharmacy*   Follow-Up: At [facility], you and your health needs are our priority.  As part of our continuing mission to provide you with exceptional heart care, we have created designated Provider Care Teams.  These Care Teams include your primary Cardiologist (physician) and Advanced Practice Providers (APPs -  Physician Assistants and Nurse Practitioners) who all work together to provide you with the care you need, when you need it.  We recommend signing up for the patient portal called "MyChart".  Sign up information is provided on this After Visit Summary.  MyChart is used to connect with patients for Virtual Visits (Telemedicine).  Patients are able to view lab/test results, encounter notes, upcoming appointments, etc.  Non-urgent messages can be sent to your provider as well.   To learn more about what you can do with MyChart, go to [email or web address].    Your next appointment:   12 month(s)  Provider:   Callie Goodrich, PA-C, Hao Meng, PA-C, or Emily Monge, NP     

## 2022-12-27 ENCOUNTER — Ambulatory Visit (INDEPENDENT_AMBULATORY_CARE_PROVIDER_SITE_OTHER): Payer: Medicare Other

## 2022-12-27 DIAGNOSIS — I480 Paroxysmal atrial fibrillation: Secondary | ICD-10-CM | POA: Diagnosis not present

## 2022-12-28 LAB — CUP PACEART REMOTE DEVICE CHECK
Battery Remaining Longevity: 96 mo
Battery Remaining Percentage: 94 %
Brady Statistic RA Percent Paced: 34 %
Brady Statistic RV Percent Paced: 100 %
Date Time Interrogation Session: 20240415020100
Implantable Lead Connection Status: 753985
Implantable Lead Connection Status: 753985
Implantable Lead Implant Date: 20171205
Implantable Lead Implant Date: 20171205
Implantable Lead Location: 753859
Implantable Lead Location: 753860
Implantable Lead Model: 7740
Implantable Lead Model: 7741
Implantable Lead Serial Number: 673793
Implantable Lead Serial Number: 819256
Implantable Pulse Generator Implant Date: 20171205
Lead Channel Impedance Value: 670 Ohm
Lead Channel Impedance Value: 713 Ohm
Lead Channel Pacing Threshold Amplitude: 0.7 V
Lead Channel Pacing Threshold Amplitude: 1 V
Lead Channel Pacing Threshold Pulse Width: 0.4 ms
Lead Channel Pacing Threshold Pulse Width: 0.4 ms
Lead Channel Setting Pacing Amplitude: 2 V
Lead Channel Setting Pacing Amplitude: 2.5 V
Lead Channel Setting Pacing Pulse Width: 0.4 ms
Lead Channel Setting Sensing Sensitivity: 2.5 mV
Pulse Gen Serial Number: 732442
Zone Setting Status: 755011

## 2023-02-03 NOTE — Progress Notes (Signed)
Remote pacemaker transmission.   

## 2023-03-07 ENCOUNTER — Telehealth: Payer: Self-pay | Admitting: Cardiovascular Disease

## 2023-03-07 NOTE — Telephone Encounter (Signed)
     Pre-operative Risk Assessment    Patient Name: ANGILA WOMBLES  DOB: [birth year] MRN: [id number]     Request for Surgical Clearance    Procedure:  Dental Extraction - Amount of Teeth to be Pulled:  1 tooth  Date of Surgery:  Clearance 03/14/23                                 Surgeon:  Dr. Alvina Filbert Surgeon's Group or Practice Name:  [facility] Phone number:  [phone] Fax number:  [phone]   Type of Clearance Requested:   - Medical  - Pharmacy:  Hold Rivaroxaban (Xarelto) defer to cards   Type of Anesthesia:  Local    Additional requests/questions:   The would like to know if there's a limit of epinephrine they can give to the pt and also they prescribed pt to take amoxicillin   Signed, Angeline S Hammer   03/07/2023, 3:35 PM

## 2023-03-07 NOTE — Telephone Encounter (Signed)
    Primary Cardiologist: Reatha Harps, MD  Chart reviewed as part of pre-operative protocol coverage. Simple dental extractions are considered low risk procedures per guidelines and generally do not require any specific cardiac clearance. It is also generally accepted that for simple extractions and dental cleanings, there is no need to interrupt blood thinner therapy.   SBE prophylaxis is not required for the patient.  I will route this recommendation to the requesting party via Epic fax function and remove from pre-op pool.  Please call with questions.  Ronney Asters, NP 03/07/2023, 3:57 PM

## 2023-03-28 ENCOUNTER — Ambulatory Visit (INDEPENDENT_AMBULATORY_CARE_PROVIDER_SITE_OTHER): Payer: Medicare Other

## 2023-03-28 DIAGNOSIS — I441 Atrioventricular block, second degree: Secondary | ICD-10-CM

## 2023-03-30 LAB — CUP PACEART REMOTE DEVICE CHECK
Battery Remaining Longevity: 96 mo
Battery Remaining Percentage: 91 %
Brady Statistic RA Percent Paced: 36 %
Brady Statistic RV Percent Paced: 100 %
Date Time Interrogation Session: 20240715094100
Implantable Lead Connection Status: 753985
Implantable Lead Connection Status: 753985
Implantable Lead Implant Date: 20171205
Implantable Lead Implant Date: 20171205
Implantable Lead Location: 753859
Implantable Lead Location: 753860
Implantable Lead Model: 7740
Implantable Lead Model: 7741
Implantable Lead Serial Number: 673793
Implantable Lead Serial Number: 819256
Implantable Pulse Generator Implant Date: 20171205
Lead Channel Impedance Value: 687 Ohm
Lead Channel Impedance Value: 723 Ohm
Lead Channel Pacing Threshold Amplitude: 0.5 V
Lead Channel Pacing Threshold Amplitude: 1 V
Lead Channel Pacing Threshold Pulse Width: 0.4 ms
Lead Channel Pacing Threshold Pulse Width: 0.4 ms
Lead Channel Setting Pacing Amplitude: 2 V
Lead Channel Setting Pacing Amplitude: 2.5 V
Lead Channel Setting Pacing Pulse Width: 0.4 ms
Lead Channel Setting Sensing Sensitivity: 2.5 mV
Pulse Gen Serial Number: 732442
Zone Setting Status: 755011

## 2023-04-13 NOTE — Progress Notes (Signed)
Remote pacemaker transmission.   

## 2023-06-27 ENCOUNTER — Ambulatory Visit (INDEPENDENT_AMBULATORY_CARE_PROVIDER_SITE_OTHER): Payer: Medicare Other

## 2023-06-27 DIAGNOSIS — I441 Atrioventricular block, second degree: Secondary | ICD-10-CM | POA: Diagnosis not present

## 2023-06-29 LAB — CUP PACEART REMOTE DEVICE CHECK
Battery Remaining Longevity: 90 mo
Battery Remaining Percentage: 88 %
Brady Statistic RA Percent Paced: 38 %
Brady Statistic RV Percent Paced: 100 %
Date Time Interrogation Session: 20241014020100
Implantable Lead Connection Status: 753985
Implantable Lead Connection Status: 753985
Implantable Lead Implant Date: 20171205
Implantable Lead Implant Date: 20171205
Implantable Lead Location: 753859
Implantable Lead Location: 753860
Implantable Lead Model: 7740
Implantable Lead Model: 7741
Implantable Lead Serial Number: 673793
Implantable Lead Serial Number: 819256
Implantable Pulse Generator Implant Date: 20171205
Lead Channel Impedance Value: 725 Ohm
Lead Channel Impedance Value: 786 Ohm
Lead Channel Pacing Threshold Amplitude: 0.5 V
Lead Channel Pacing Threshold Amplitude: 1 V
Lead Channel Pacing Threshold Pulse Width: 0.4 ms
Lead Channel Pacing Threshold Pulse Width: 0.4 ms
Lead Channel Setting Pacing Amplitude: 2 V
Lead Channel Setting Pacing Amplitude: 2.5 V
Lead Channel Setting Pacing Pulse Width: 0.4 ms
Lead Channel Setting Sensing Sensitivity: 2.5 mV
Pulse Gen Serial Number: 732442
Zone Setting Status: 755011

## 2023-07-14 NOTE — Progress Notes (Signed)
Remote pacemaker transmission.   

## 2023-09-26 ENCOUNTER — Ambulatory Visit (INDEPENDENT_AMBULATORY_CARE_PROVIDER_SITE_OTHER): Payer: Medicare Other

## 2023-09-26 DIAGNOSIS — I441 Atrioventricular block, second degree: Secondary | ICD-10-CM | POA: Diagnosis not present

## 2023-09-26 LAB — CUP PACEART REMOTE DEVICE CHECK
Battery Remaining Longevity: 90 mo
Battery Remaining Percentage: 85 %
Brady Statistic RA Percent Paced: 40 %
Brady Statistic RV Percent Paced: 100 %
Date Time Interrogation Session: 20250113020100
Implantable Lead Connection Status: 753985
Implantable Lead Connection Status: 753985
Implantable Lead Implant Date: 20171205
Implantable Lead Implant Date: 20171205
Implantable Lead Location: 753859
Implantable Lead Location: 753860
Implantable Lead Model: 7740
Implantable Lead Model: 7741
Implantable Lead Serial Number: 673793
Implantable Lead Serial Number: 819256
Implantable Pulse Generator Implant Date: 20171205
Lead Channel Impedance Value: 695 Ohm
Lead Channel Impedance Value: 776 Ohm
Lead Channel Pacing Threshold Amplitude: 0.5 V
Lead Channel Pacing Threshold Amplitude: 1 V
Lead Channel Pacing Threshold Pulse Width: 0.4 ms
Lead Channel Pacing Threshold Pulse Width: 0.4 ms
Lead Channel Setting Pacing Amplitude: 2 V
Lead Channel Setting Pacing Amplitude: 2.5 V
Lead Channel Setting Pacing Pulse Width: 0.4 ms
Lead Channel Setting Sensing Sensitivity: 2.5 mV
Pulse Gen Serial Number: 732442
Zone Setting Status: 755011

## 2023-11-09 ENCOUNTER — Ambulatory Visit: Payer: Medicare Other | Attending: Internal Medicine | Admitting: Internal Medicine

## 2023-11-09 ENCOUNTER — Encounter: Payer: Self-pay | Admitting: Internal Medicine

## 2023-11-09 VITALS — BP 138/72 | HR 60 | Ht 65.0 in | Wt 183.4 lb

## 2023-11-09 DIAGNOSIS — I441 Atrioventricular block, second degree: Secondary | ICD-10-CM | POA: Diagnosis not present

## 2023-11-09 NOTE — Progress Notes (Signed)
 Remote pacemaker transmission.

## 2023-11-09 NOTE — Progress Notes (Signed)
 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 She also has a history of paroxysmal atrial fibrillation. She denies chest pain or shortness of breath. She feels well. She has undergone CABG over 2 years ago. In the interim she has done well. No chest pain or sob.  Allergies  Allergen Reactions   Propoxyphene Rash and Other (See Comments)    Darvocet      Current Outpatient Medications  Medication Sig Dispense Refill   aspirin EC 81 MG EC tablet Take 1 tablet (81 mg total) by mouth daily.     atenolol (TENORMIN) 50 MG tablet Take 25 mg by mouth every morning.      cephALEXin (KEFLEX) 500 MG capsule Take 1 capsule (500 mg total) by mouth 3 (three) times daily. 21 capsule 0   Cholecalciferol (VITAMIN D3) 1000 units CAPS Take 1,000 Units by mouth every morning.     ezetimibe (ZETIA) 10 MG tablet Take 1 tablet (10 mg total) by mouth daily. 30 tablet 3   lisinopril (ZESTRIL) 5 MG tablet Take 1 tablet (5 mg total) by mouth daily. 90 tablet 3   metFORMIN (GLUCOPHAGE-XR) 500 MG 24 hr tablet Take 500 mg by mouth in the morning and at bedtime.     nitroGLYCERIN (NITROSTAT) 0.4 MG SL tablet Place 0.4 mg as needed under the tongue for chest pain.     omeprazole (PRILOSEC) 20 MG capsule Take 20 mg by mouth daily before breakfast.     rosuvastatin (CRESTOR) 40 MG tablet Take 40 mg by mouth at bedtime.      XARELTO 20 MG TABS tablet TAKE ONE TABLET (20MG  DOSE) BY MOUTH DAILY (Patient taking differently: 20 mg at bedtime.) 90 tablet 1   HYDROcodone-acetaminophen (NORCO) 5-325 MG tablet Take 1-2 tablets by mouth every 6 (six) hours as needed. 12 tablet 0   No current facility-administered medications for this visit.     Past Medical History:  Diagnosis Date   Atrial fibrillation (HCC)    AV block, 2nd degree 08/2016   Constipation    Coronary artery disease involving  native coronary artery of native heart with angina pectoris (HCC) 2008   - DES PCI mLAD   Cystocele with prolapse    Cystocele with rectocele    Diabetes mellitus without complication (HCC)    type 2   GERD (gastroesophageal reflux disease)    Headache    sinus migraine occ   High cholesterol    Hypertension    Presence of drug coated stent in anterior descending branch of left coronary artery 2008   Taxus DES 2.75 x 16 mm (post-dilated to 3.0 mm) - calcified mid LAD segment.   Presence of permanent cardiac pacemaker 08/17/2016   [employer]   Wears glasses     ROS:   All systems reviewed and negative except as noted in the HPI.   Past Surgical History:  Procedure Laterality Date   ABDOMINAL HYSTERECTOMY  age 79   partial   APPENDECTOMY  age 36   BACK SURGERY  14 yrs ago   L 5   basal cell area removed  2019   shoulder   colonscopy  2019   polyps removed   CORONARY ANGIOPLASTY  14 yrs ago   stent x 1    CORONARY ARTERY BYPASS GRAFT N/A 08/26/2021   Procedure: CORONARY ARTERY BYPASS GRAFTING (CABG) TIMES 3 USING LEFT INTERNAL  MAMMARY ARTERY AND GREATER SAPHENOUS VEIN HARVESTED ENDOSCOPICALLY. LIMA TO LAD, SVG TO OM1, SVG TO OM2;  Surgeon: Alleen Borne, MD;  Location: [facility] OR;  Service: Open Heart Surgery;  Laterality: N/A;   CYSTOCELE REPAIR N/A 12/08/2020   Procedure: ANTERIOR REPAIR (CYSTOCELE);  Surgeon: Huel Cote, MD;  Location: [facility];  Service: Gynecology;  Laterality: N/A;   ENDOVEIN HARVEST OF GREATER SAPHENOUS VEIN Right 08/26/2021   Procedure: ENDOVEIN HARVEST OF GREATER SAPHENOUS VEIN;  Surgeon: Alleen Borne, MD;  Location: [facility] OR;  Service: Open Heart Surgery;  Laterality: Right;   EP IMPLANTABLE DEVICE N/A 08/17/2016   Procedure: Pacemaker Implant;  Surgeon: Marinus Maw, MD;  Location: [facility] INVASIVE CV LAB;  Service: Cardiovascular;  Laterality: N/A;   FOOT SURGERY Left yrs ago   INSERT / REPLACE / REMOVE PACEMAKER   08/17/2016   KNEE ARTHROSCOPY Right yrs ago   LEFT HEART CATH AND CORONARY ANGIOGRAPHY N/A 08/26/2021   Procedure: LEFT HEART CATH AND CORONARY ANGIOGRAPHY;  Surgeon: Marykay Lex, MD;  Location: [facility] INVASIVE CV LAB;  Service: Cardiovascular;  Laterality: N/A;   ROTATOR CUFF REPAIR Right yrs ago   TEE WITHOUT CARDIOVERSION N/A 08/26/2021   Procedure: TRANSESOPHAGEAL ECHOCARDIOGRAM (TEE);  Surgeon: Alleen Borne, MD;  Location: [facility] OR;  Service: Open Heart Surgery;  Laterality: N/A;     Family History  Problem Relation Age of Onset   Heart disease Mother    Diabetes Mother    Heart attack Father    Cancer - Colon Sister    Diabetes Sister    Cancer - Lung Brother    Cancer - Lung Brother    Heart disease Son      Social History   Socioeconomic History   Marital status: Divorced    Spouse name: Not on file   Number of children: 2   Years of education: Not on file   Highest education level: Not on file  Occupational History   Occupation: retired  Tobacco Use   Smoking status: Never   Smokeless tobacco: Never  Vaping Use   Vaping status: Never Used  Substance and Sexual Activity   Alcohol use: Not Currently    Comment: occ wine   Drug use: No   Sexual activity: Not on file  Other Topics Concern   Not on file  Social History Narrative   Not on file   Social Drivers of Health   Financial Resource Strain: Low Risk  (08/22/2023)   Received from [facility]   Overall Financial Resource Strain (CARDIA)    Difficulty of Paying Living Expenses: Not very hard  Food Insecurity: No Food Insecurity (08/22/2023)   [facility] from [facility]   Hunger Vital Sign    Worried About Running Out of Food in the Last Year: Never true    Ran Out of Food in the Last Year: Never true  Transportation Needs: No Transportation Needs (08/22/2023)   Received from [facility] - Transportation    Lack of Transportation (Medical): No    Lack of Transportation (Non-Medical):  No  Physical Activity: Sufficiently Active (08/22/2023)   Received from [facility]   Exercise Vital Sign    Days of Exercise per Week: 3 days    Minutes of Exercise per Session: 50 min  Stress: No Stress Concern Present (08/22/2023)   Received from [facility] - Occupational Stress Questionnaire    Feeling of  Stress : Not at all  Social Connections: Socially Integrated (08/22/2023)   Received from [facility]   Social Network    How would you rate your social network (family, work, friends)?: Good participation with social networks  Intimate Partner Violence: Not At Risk (08/22/2023)   Received from [facility]   HITS    Over the last 12 months how often did your partner physically hurt you?: Never    Over the last 12 months how often did your partner insult you or talk down to you?: Never    Over the last 12 months how often did your partner threaten you with physical harm?: Never    Over the last 12 months how often did your partner scream or curse at you?: Never     BP 138/72   Pulse 60   Ht 5\' 5"  (1.651 m)   Wt 183 lb 6.4 oz (83.2 kg)   SpO2 94%   BMI 30.52 kg/m   Physical Exam:  Well appearing NAD HEENT: Unremarkable Neck:  No JVD, no thyromegally Lymphatics:  No adenopathy Back:  No CVA tenderness Lungs:  Clear HEART:  Regular rate rhythm, no murmurs, no rubs, no clicks Abd:  soft, positive bowel sounds, no organomegally, no rebound, no guarding Ext:  2 plus pulses, no edema, no cyanosis, no clubbing Skin:  No rashes no nodules Neuro:  CN II through XII intact, motor grossly intact  EKG - nsr with AV pacing  DEVICE  Normal device function.  See PaceArt for details.   Assess/Plan:  1.CHB - she is asymptomatic, s/p PPM insertion. No change 2. PPM - her [employer] DDD PM is working normally. We will recheck in several months. Her device is under advisory recall. She has 7 years of battery longevity and I will  reach out to the [employer] rep to get the companies' rec on whether watchful waiting vs removal is appropriate. 3. HTN - her bp is only minimally elevated. I asked her to lose weight and maintain a low sodium diet. 4. Obesity - her weight is stable. 5. CAD - she is s/p CABG for critical left main disease. No anginal symptoms.    Sharlot Gowda Keiri Solano,MD

## 2023-11-09 NOTE — Patient Instructions (Signed)
 Medication Instructions:  Your physician recommends that you continue on your current medications as directed. Please refer to the Current Medication list given to you today.   Labwork: None today  Testing/Procedures: None today  Follow-Up: 1 year  Any Other Special Instructions Will Be Listed Below (If Applicable).  If you need a refill on your cardiac medications before your next appointment, please call your pharmacy.

## 2023-11-14 LAB — CUP PACEART INCLINIC DEVICE CHECK
Brady Statistic RA Percent Paced: 2 %
Brady Statistic RV Percent Paced: 100 %
Date Time Interrogation Session: 20250226203900
Implantable Lead Connection Status: 753985
Implantable Lead Connection Status: 753985
Implantable Lead Implant Date: 20171205
Implantable Lead Implant Date: 20171205
Implantable Lead Location: 753859
Implantable Lead Location: 753860
Implantable Lead Model: 7740
Implantable Lead Model: 7741
Implantable Lead Serial Number: 673793
Implantable Lead Serial Number: 819256
Implantable Pulse Generator Implant Date: 20171205
Lead Channel Impedance Value: 632 Ohm
Lead Channel Impedance Value: 829 Ohm
Lead Channel Pacing Threshold Amplitude: 0.8 V
Lead Channel Pacing Threshold Amplitude: 1 V
Lead Channel Sensing Intrinsic Amplitude: 2.5 mV
Pulse Gen Serial Number: 732442

## 2023-12-26 ENCOUNTER — Ambulatory Visit: Payer: Medicare Other

## 2023-12-26 DIAGNOSIS — I441 Atrioventricular block, second degree: Secondary | ICD-10-CM | POA: Diagnosis not present

## 2023-12-27 LAB — CUP PACEART REMOTE DEVICE CHECK
Battery Remaining Longevity: 84 mo
Battery Remaining Percentage: 83 %
Brady Statistic RA Percent Paced: 52 %
Brady Statistic RV Percent Paced: 100 %
Date Time Interrogation Session: 20250414020100
Implantable Lead Connection Status: 753985
Implantable Lead Connection Status: 753985
Implantable Lead Implant Date: 20171205
Implantable Lead Implant Date: 20171205
Implantable Lead Location: 753859
Implantable Lead Location: 753860
Implantable Lead Model: 7740
Implantable Lead Model: 7741
Implantable Lead Serial Number: 673793
Implantable Lead Serial Number: 819256
Implantable Pulse Generator Implant Date: 20171205
Lead Channel Impedance Value: 688 Ohm
Lead Channel Impedance Value: 777 Ohm
Lead Channel Pacing Threshold Amplitude: 1 V
Lead Channel Pacing Threshold Pulse Width: 0.4 ms
Lead Channel Setting Pacing Amplitude: 2 V
Lead Channel Setting Pacing Amplitude: 2.5 V
Lead Channel Setting Pacing Pulse Width: 0.4 ms
Lead Channel Setting Sensing Sensitivity: 2.5 mV
Pulse Gen Serial Number: 732442
Zone Setting Status: 755011

## 2023-12-30 ENCOUNTER — Encounter: Payer: Self-pay | Admitting: Internal Medicine

## 2024-02-16 NOTE — Progress Notes (Signed)
 Remote pacemaker transmission.

## 2024-02-16 NOTE — Addendum Note (Signed)
 Addended by: Edra Govern D on: 02/16/2024 04:33 PM   Modules accepted: Orders

## 2024-03-26 ENCOUNTER — Ambulatory Visit: Payer: Medicare Other

## 2024-03-26 DIAGNOSIS — I441 Atrioventricular block, second degree: Secondary | ICD-10-CM

## 2024-03-27 LAB — CUP PACEART REMOTE DEVICE CHECK
Battery Remaining Longevity: 78 mo
Battery Remaining Percentage: 80 %
Brady Statistic RA Percent Paced: 53 %
Brady Statistic RV Percent Paced: 100 %
Date Time Interrogation Session: 20250714020100
Implantable Lead Connection Status: 753985
Implantable Lead Connection Status: 753985
Implantable Lead Implant Date: 20171205
Implantable Lead Implant Date: 20171205
Implantable Lead Location: 753859
Implantable Lead Location: 753860
Implantable Lead Model: 7740
Implantable Lead Model: 7741
Implantable Lead Serial Number: 673793
Implantable Lead Serial Number: 819256
Implantable Pulse Generator Implant Date: 20171205
Lead Channel Impedance Value: 693 Ohm
Lead Channel Impedance Value: 778 Ohm
Lead Channel Pacing Threshold Amplitude: 0.8 V
Lead Channel Pacing Threshold Amplitude: 1 V
Lead Channel Pacing Threshold Pulse Width: 0.4 ms
Lead Channel Pacing Threshold Pulse Width: 0.4 ms
Lead Channel Setting Pacing Amplitude: 2 V
Lead Channel Setting Pacing Amplitude: 2.5 V
Lead Channel Setting Pacing Pulse Width: 0.4 ms
Lead Channel Setting Sensing Sensitivity: 2.5 mV
Pulse Gen Serial Number: 732442
Zone Setting Status: 755011

## 2024-03-28 ENCOUNTER — Ambulatory Visit: Payer: Self-pay | Admitting: Internal Medicine

## 2024-06-21 NOTE — Progress Notes (Signed)
 Remote PPM Transmission

## 2024-06-25 ENCOUNTER — Ambulatory Visit: Payer: Medicare Other

## 2024-06-25 DIAGNOSIS — I441 Atrioventricular block, second degree: Secondary | ICD-10-CM

## 2024-06-25 LAB — CUP PACEART REMOTE DEVICE CHECK
Battery Remaining Longevity: 78 mo
Battery Remaining Percentage: 76 %
Brady Statistic RA Percent Paced: 56 %
Brady Statistic RV Percent Paced: 100 %
Date Time Interrogation Session: 20251013020100
Implantable Lead Connection Status: 753985
Implantable Lead Connection Status: 753985
Implantable Lead Implant Date: 20171205
Implantable Lead Implant Date: 20171205
Implantable Lead Location: 753859
Implantable Lead Location: 753860
Implantable Lead Model: 7740
Implantable Lead Model: 7741
Implantable Lead Serial Number: 673793
Implantable Lead Serial Number: 819256
Implantable Pulse Generator Implant Date: 20171205
Lead Channel Impedance Value: 717 Ohm
Lead Channel Impedance Value: 735 Ohm
Lead Channel Pacing Threshold Amplitude: 0.7 V
Lead Channel Pacing Threshold Amplitude: 1.2 V
Lead Channel Pacing Threshold Pulse Width: 0.4 ms
Lead Channel Pacing Threshold Pulse Width: 0.4 ms
Lead Channel Setting Pacing Amplitude: 2 V
Lead Channel Setting Pacing Amplitude: 2.5 V
Lead Channel Setting Pacing Pulse Width: 0.4 ms
Lead Channel Setting Sensing Sensitivity: 2.5 mV
Pulse Gen Serial Number: 732442
Zone Setting Status: 755011

## 2024-06-27 NOTE — Progress Notes (Signed)
 Remote PPM Transmission

## 2024-06-28 ENCOUNTER — Ambulatory Visit: Payer: Self-pay | Admitting: Internal Medicine

## 2024-08-06 ENCOUNTER — Encounter: Payer: Self-pay | Admitting: *Deleted

## 2024-08-07 ENCOUNTER — Encounter: Payer: Self-pay | Admitting: Emergency Medicine

## 2024-08-07 ENCOUNTER — Ambulatory Visit: Attending: Emergency Medicine | Admitting: Emergency Medicine

## 2024-08-07 VITALS — BP 136/82 | HR 60 | Ht 65.0 in | Wt 187.0 lb

## 2024-08-07 DIAGNOSIS — I25810 Atherosclerosis of coronary artery bypass graft(s) without angina pectoris: Secondary | ICD-10-CM | POA: Diagnosis not present

## 2024-08-07 DIAGNOSIS — E782 Mixed hyperlipidemia: Secondary | ICD-10-CM

## 2024-08-07 DIAGNOSIS — I480 Paroxysmal atrial fibrillation: Secondary | ICD-10-CM

## 2024-08-07 DIAGNOSIS — Z950 Presence of cardiac pacemaker: Secondary | ICD-10-CM | POA: Diagnosis not present

## 2024-08-07 DIAGNOSIS — I10 Essential (primary) hypertension: Secondary | ICD-10-CM

## 2024-08-07 NOTE — Patient Instructions (Signed)
 Medication Instructions:  NO CHANGES   Lab Work: NONE TO BE DONE TODAY.   Testing/Procedures: NONE  Follow-Up: At [facility], you and your health needs are our priority.  As part of our continuing mission to provide you with exceptional heart care, our providers are all part of one team.  This team includes your primary Cardiologist (physician) and Advanced Practice Providers or APPs (Physician Assistants and Nurse Practitioners) who all work together to provide you with the care you need, when you need it.  Your next appointment:   1 YEAR  Provider:   Darryle ONEIDA Decent, MD OR Lum Louis, NP

## 2024-08-07 NOTE — Progress Notes (Signed)
 Cardiology Office Note:    Date:  08/07/2024  ID:  [name] [name], DOB 1946-01-17, MRN [id number] PCP: Sophronia Ozell BROCKS, MD  [facility] Providers Cardiologist:  Darryle ONEIDA Decent, MD Electrophysiologist:  Danelle Birmingham, MD       Patient Profile:       Chief Complaint: 1 year follow up History of Present Illness:  [name] [name] is a 78 y.o. female with visit-pertinent history of coronary artery disease s/p CABG, complete heart block s/p pacemaker, diabetes, hyperlipidemia, hyperlipidemia, paroxysmal atrial fibrillation  Problem List CHB s/p ppm HTN DM -A1c 7.1 CAD -PCI mid LAD 2008 -Unstable Angina 08/26/2021 with severe LM dz -CABG x 3 (LIMA-LAD, SVG-Om1, SVG-Om2 08/26/2021) 5. HLD -T chol 134, HDL 58, LDL 59, TG 88 6. Paroxysmal Afib  -CHADSVASC=5 (age,female, HTN, DM, CAD)  Last seen by general cardiology on 12/20/2022 by Dr. Decent.  She was doing well without any acute complaints.  She was to follow-up in 1 year.   Discussed the use of AI scribe software for clinical note transcription with the patient, who gave verbal consent to proceed.  History of Present Illness [name] [name] is a 78 year old female with coronary artery disease and atrial fibrillation who presents for a routine cardiology follow-up.  Today she is doing well without acute cardiovascular concerns.  She remains active with yard work and line dancing without chest pain or shortness of breath.  Her LDL is 61 mg/dL on ezetimibe  and rosuvastatin .  She has paroxysmal atrial fibrillation on Xarelto  without palpitations.  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  She has no associated rapid heart rate, fatigue, chest pain, or shortness of breath.  Her blood pressure is slightly elevated in the office but she reports normal readings at home and attributes this to white coat effect. She has taken antihypertensives since age 67 and is now on low-dose lisinopril , which she recently  reduced on her own due to dizziness on standing.  She has diabetes treated with metformin  500 mg twice daily, with a recent A1c of 6.9%. She has had no bleeding issues on Xarelto .  She denies orthopnea, PND, syncope, presyncope, lightheadedness, dizziness, melena, hematochezia   Review of systems:  Please see the history of present illness. All other systems are reviewed and otherwise negative.      Studies Reviewed:        Echocardiogram 10/20/2021  1. Left ventricular ejection fraction, by estimation, is 45%. The left  ventricle has mildly decreased function. The left ventricle demonstrates  regional wall motion abnormalities (suggestive of LAD disease). There is  mild concentric left ventricular  hypertrophy. Left ventricular diastolic parameters are consistent with  Grade I diastolic dysfunction (impaired relaxation).   2. Right ventricular systolic function is normal. The right ventricular  size is normal. There is normal pulmonary artery systolic pressure. The  estimated right ventricular systolic pressure is 20.0 mmHg.   3. Left atrial size was moderately dilated.   4. The mitral valve is grossly normal. Trivial mitral valve  regurgitation. The mean mitral valve gradient is 2.6 mmHg. Moderate mitral  annular calcification.   5. The aortic valve was not well visualized. Aortic valve regurgitation  is mild. No aortic stenosis is present.   6. The inferior vena cava is normal in size with greater than 50%  respiratory variability, suggesting right atrial pressure of 3 mmHg.   Cardiac catheterization 08/26/2021   Mid LM to Ost LAD lesion is 95% stenosed with  75% stenosed side branch in Ost Cx.   1st Mrg lesion is 90% stenosed.   Prox LAD lesion is 30% stenosed. Mid LAD STENT is 5% stenosed.   Ost RCA lesion is 20% stenosed.   The left ventricular systolic function is normal.  The left ventricular ejection fraction is 55-65% by visual estimate.   LV end diastolic pressure is  normal.   There is no aortic valve stenosis.   SUMMARY Severe distal LEFT MAIN 95% calcific, eccentric lesion -> excellent distal targets in the LAD and LCx-OM distribution Severe ostial and proximal OM1 90% stenosis (calcified) Mild proximal LAD disease with widely patent mid LAD stent. Normal, dominant RCA with 20% ostial stenosis Normal LVEDP.   Normal EF and wall motion on hand-injection LV gram.     RECOMMENDATIONS Urgent CVTS consultation placed. With no active ongoing chest pain and hemodynamically stable, no IABP for now. IV NTG with plans to restart IV heparin  6 hours post sheath removal. Has echo ordered, not done Aggressive CV RF modification with GDMT. Diagnostic Dominance: Right  Risk Assessment/Calculations:    CHA2DS2-VASc Score = 6   This indicates a 9.7% annual risk of stroke. The patient's score is based upon: CHF History: 0 HTN History: 1 Diabetes History: 1 Stroke History: 0 Vascular Disease History: 1 Age Score: 2 Gender Score: 1             Physical Exam:   VS:  BP 136/82   Pulse 60   Ht 5' 5 (1.651 m)   Wt 187 lb (84.8 kg)   BMI 31.12 kg/m    Wt Readings from Last 3 Encounters:  08/07/24 187 lb (84.8 kg)  11/09/23 183 lb 6.4 oz (83.2 kg)  12/20/22 179 lb (81.2 kg)    GEN: Well nourished, well developed in no acute distress NECK: No JVD; No carotid bruits CARDIAC: RRR, no murmurs, rubs, gallops RESPIRATORY:  Clear to auscultation without rales, wheezing or rhonchi  ABDOMEN: Soft, non-tender, non-distended EXTREMITIES:  No edema; No acute deformity      Assessment and Plan:  Coronary artery disease S/p PCI to mid LAD in 2008 S/p CABG x 3 due to critical LM disease (LIMA-LAD, SVG-Om1, SVG-Om2) in 2022 - Today she is stable without chest pains.  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  No symptoms to suggest angina - No indication further ischemic evaluation at this time - Continue aspirin  81  mg daily, ezetimibe  10 mg daily, rosuvastatin  40 mg daily, atenolol  25 mg daily, and nitroglycerin  as needed - No ASA due to chronic anticoagulation  Hypertension Blood pressure today is 136/82 Does have history of whitecoat effect and reports home blood pressures controlled - No changes to current therapy - Continue atenolol  25 mg daily and lisinopril  5 mg daily  - Encouraged close monitoring with PCP  Hyperlipidemia LDL 61 on 02/2024 and well-controlled - Continue rosuvastatin  40 mg daily and ezetimibe  10 mg  Paroxysmal atrial fibrillation Well-controlled without paroxysms  - She is without any symptoms concerning for recurrent atrial fibrillation - Continue Xarelto  20 mg daily and atenolol  25 mg daily  T2DM A1c 6.9% on 02/2024 - Managed on metformin  500 mg twice daily per PCP - Encouraged heart healthy dieting  CHB S/p PPM insertion - Management per EP      Dispo:  Return in about 1 year (around 08/07/2025).  Signed, Lum LITTIE Louis, NP

## 2024-09-24 ENCOUNTER — Ambulatory Visit: Payer: Medicare Other

## 2024-09-24 DIAGNOSIS — I441 Atrioventricular block, second degree: Secondary | ICD-10-CM | POA: Diagnosis not present

## 2024-09-25 ENCOUNTER — Ambulatory Visit: Payer: Self-pay | Admitting: Student in an Organized Health Care Education/Training Program

## 2024-09-25 LAB — CUP PACEART REMOTE DEVICE CHECK
Battery Remaining Longevity: 72 mo
Battery Remaining Percentage: 72 %
Brady Statistic RA Percent Paced: 51 %
Brady Statistic RV Percent Paced: 100 %
Date Time Interrogation Session: 20260112020100
Implantable Lead Connection Status: 753985
Implantable Lead Connection Status: 753985
Implantable Lead Implant Date: 20171205
Implantable Lead Implant Date: 20171205
Implantable Lead Location: 753859
Implantable Lead Location: 753860
Implantable Lead Model: 7740
Implantable Lead Model: 7741
Implantable Lead Serial Number: 673793
Implantable Lead Serial Number: 819256
Implantable Pulse Generator Implant Date: 20171205
Lead Channel Impedance Value: 729 Ohm
Lead Channel Impedance Value: 843 Ohm
Lead Channel Pacing Threshold Amplitude: 0.7 V
Lead Channel Pacing Threshold Amplitude: 1.1 V
Lead Channel Pacing Threshold Pulse Width: 0.4 ms
Lead Channel Pacing Threshold Pulse Width: 0.4 ms
Lead Channel Setting Pacing Amplitude: 2 V
Lead Channel Setting Pacing Amplitude: 2.5 V
Lead Channel Setting Pacing Pulse Width: 0.4 ms
Lead Channel Setting Sensing Sensitivity: 2.5 mV
Pulse Gen Serial Number: 732442
Zone Setting Status: 755011

## 2024-09-28 NOTE — Progress Notes (Signed)
 Remote PPM Transmission

## 2024-11-09 ENCOUNTER — Ambulatory Visit: Admitting: Student in an Organized Health Care Education/Training Program
